# Patient Record
Sex: Male | Born: 1940
Health system: Southern US, Community
[De-identification: ages and names within clinical notes are randomized; demographics above are authoritative.]

## PROBLEM LIST (undated history)

## (undated) DIAGNOSIS — R7303 Prediabetes: Secondary | ICD-10-CM

## (undated) DIAGNOSIS — I2510 Atherosclerotic heart disease of native coronary artery without angina pectoris: Secondary | ICD-10-CM

## (undated) DIAGNOSIS — I4891 Unspecified atrial fibrillation: Secondary | ICD-10-CM

## (undated) DIAGNOSIS — E785 Hyperlipidemia, unspecified: Secondary | ICD-10-CM

## (undated) DIAGNOSIS — Z9889 Other specified postprocedural states: Secondary | ICD-10-CM

## (undated) DIAGNOSIS — N5089 Other specified disorders of the male genital organs: Secondary | ICD-10-CM

## (undated) DIAGNOSIS — Z87442 Personal history of urinary calculi: Secondary | ICD-10-CM

## (undated) DIAGNOSIS — I639 Cerebral infarction, unspecified: Secondary | ICD-10-CM

## (undated) DIAGNOSIS — R112 Nausea with vomiting, unspecified: Secondary | ICD-10-CM

## (undated) HISTORY — DX: Atherosclerotic heart disease of native coronary artery without angina pectoris: I25.10

## (undated) HISTORY — DX: Unspecified atrial fibrillation: I48.91

## (undated) HISTORY — DX: Cerebral infarction, unspecified: I63.9

## (undated) HISTORY — PX: IRRIGATION AND DEBRIDEMENT SEBACEOUS CYST: SHX5255

---

## 1968-07-13 HISTORY — PX: CYSTOSCOPY: SUR368

## 1972-07-13 HISTORY — PX: APPENDECTOMY: SHX54

## 2008-07-13 HISTORY — PX: SKIN CANCER EXCISION: SHX779

## 2010-05-24 ENCOUNTER — Emergency Department (HOSPITAL_COMMUNITY): Admission: EM | Admit: 2010-05-24 | Discharge: 2010-05-24 | Payer: Self-pay | Admitting: Emergency Medicine

## 2010-06-30 HISTORY — PX: EYE SURGERY: SHX253

## 2010-09-19 ENCOUNTER — Ambulatory Visit (HOSPITAL_COMMUNITY)
Admission: RE | Admit: 2010-09-19 | Discharge: 2010-09-19 | Disposition: A | Discharge status: Home / Self Care | Payer: Medicare Other | Source: Ambulatory Visit | Attending: Ophthalmology | Admitting: Ophthalmology

## 2010-09-19 DIAGNOSIS — I6529 Occlusion and stenosis of unspecified carotid artery: Secondary | ICD-10-CM | POA: Insufficient documentation

## 2010-09-19 DIAGNOSIS — H3400 Transient retinal artery occlusion, unspecified eye: Secondary | ICD-10-CM | POA: Insufficient documentation

## 2010-09-30 ENCOUNTER — Encounter (INDEPENDENT_AMBULATORY_CARE_PROVIDER_SITE_OTHER): Payer: Medicare Other | Admitting: Vascular Surgery

## 2010-09-30 ENCOUNTER — Other Ambulatory Visit (INDEPENDENT_AMBULATORY_CARE_PROVIDER_SITE_OTHER): Payer: Medicare Other

## 2010-09-30 DIAGNOSIS — I6529 Occlusion and stenosis of unspecified carotid artery: Secondary | ICD-10-CM

## 2010-10-01 NOTE — Consult Note (Signed)
NEW PATIENT CONSULTATION  [name], [name] DOB:  Sep 01, 1940                                       09/30/2010 [id number]#:[id number]  This patient presents today for evaluation of symptomatic right internal carotid artery stenosis.  He is a very active, pleasant 70 year old gentleman who has had right eye visual changes.  He reports that he had a detached retina which was treated surgically with Dr. Luciana [name] in December 2011.  Subsequently he had several episodes which sounded to be like transit visual ischemia.  He had a temporary loss of vision on several different occasions.  This prompted a carotid duplex showing critical right carotid stenosis and he is seeing [place] for further evaluation.  He specifically denies, before this, of any episodes of amaurosis and prior to that, he denies any history of transient ischemic attack or stroke.  He has no cardiac history.  No history of diabetes, hypertension, or elevated cholesterol.  He is on the daily aspirin therapy and otherwise takes multiple vitamin supplements, but otherwise no prescription medications.  SOCIAL HISTORY:  He is married with two children.  He does not smoke or drink alcohol.  FAMILY HISTORY:  Negative for premature atherosclerotic disease.  REVIEW OF SYSTEMS:  No weight loss or gain.  He weighs 175 pounds.  He is 5 feet 10 inches tall. VASCULAR:  Positive only for his recent visual changes. CARDIAC, GI, NEUROLOGIC:  All negative otherwise. Review of systems is otherwise completely negative.  PHYSICAL EXAMINATION:  This is a well-developed, well-nourished white male appearing stated age in no acute stress.  Blood pressure 126/82 in his right arm, 126/74 in his left arm.  Heart rate 70, respirations 16. He is no acute stress.  HEENT:  Normal.  Lungs:  Clear bilaterally without rales, rhonchi or wheezes.  Heart:  Regular rate and rhythm without murmur.  I do not appreciate carotid bruits bilaterally.   Has 2+ radial and  2+ dorsalis pedis pulses bilaterally.  Abdomen:  Soft, nontender.  No masses noted.  Musculoskeletal:  No major deformities or cyanosis.  Neurologic:  No focal weakness or paresthesias.  Skin: Without ulcers or rashes.  He did undergo repeat duplex in our office to confirm his level of stenosis and ensure that he had adequate visualization of internal carotid artery.  This is does show critical stenosis in his right internal carotid artery with end-diastolic velocity of 242 cm/sec.  I discussed the significance of this with the patient and his wife present.  I have recommended right carotid endarterectomy for reduction of stroke risk due to his symptomatic carotid disease.  I explained the procedure to include 1% to 2% risk of stroke with surgery and risk of cranial nerve injury.  I also explained the very low risk for bleeding or infectious complications.  He understands and wished to proceed with surgery.  We have scheduled this for 10/03/2010 at [facility].    Adam [name], M.D. Electronically Signed  TFE/MEDQ  D:  09/30/2010  T:  10/01/2010  Job:  [id number]  cc:   Adam [name], M.D. Adam [name], M.D.

## 2010-10-02 ENCOUNTER — Other Ambulatory Visit: Payer: Self-pay | Admitting: Vascular Surgery

## 2010-10-02 ENCOUNTER — Encounter (HOSPITAL_COMMUNITY)
Admission: RE | Admit: 2010-10-02 | Discharge: 2010-10-02 | Disposition: A | Discharge status: Home / Self Care | Payer: Medicare Other | Source: Ambulatory Visit | Attending: Vascular Surgery | Admitting: Vascular Surgery

## 2010-10-02 DIAGNOSIS — I6529 Occlusion and stenosis of unspecified carotid artery: Secondary | ICD-10-CM

## 2010-10-02 LAB — URINALYSIS, ROUTINE W REFLEX MICROSCOPIC
Glucose, UA: NEGATIVE mg/dL
Hgb urine dipstick: NEGATIVE
pH: 6.5 (ref 5.0–8.0)

## 2010-10-02 LAB — CBC
HCT: 40.7 % (ref 39.0–52.0)
Hemoglobin: 13.6 g/dL (ref 13.0–17.0)
MCH: 30.5 pg (ref 26.0–34.0)
RBC: 4.46 MIL/uL (ref 4.22–5.81)

## 2010-10-02 LAB — COMPREHENSIVE METABOLIC PANEL
AST: 23 U/L (ref 0–37)
Albumin: 4.3 g/dL (ref 3.5–5.2)
Calcium: 9.4 mg/dL (ref 8.4–10.5)
Creatinine, Ser: 0.93 mg/dL (ref 0.4–1.5)
GFR calc Af Amer: >60 mL/min (ref >60)
GFR calc non Af Amer: >60 mL/min (ref >60)
Total Protein: 6.3 g/dL (ref 6.0–8.3)

## 2010-10-02 LAB — TYPE AND SCREEN

## 2010-10-02 LAB — APTT: aPTT: 30 seconds (ref 24–37)

## 2010-10-02 LAB — ABO/RH: ABO/RH(D): O NEG

## 2010-10-02 LAB — SURGICAL PCR SCREEN
MRSA, PCR: NEGATIVE
Staphylococcus aureus: POSITIVE — AB

## 2010-10-02 LAB — PROTIME-INR: Prothrombin Time: 13.6 seconds (ref 11.6–15.2)

## 2010-10-03 ENCOUNTER — Other Ambulatory Visit: Payer: Self-pay | Admitting: Vascular Surgery

## 2010-10-03 ENCOUNTER — Inpatient Hospital Stay (HOSPITAL_COMMUNITY)
Admission: RE | Admit: 2010-10-03 | Discharge: 2010-10-04 | DRG: 039 | Disposition: A | Discharge status: Home / Self Care | Payer: Medicare Other | Source: Ambulatory Visit | Attending: Vascular Surgery | Admitting: Vascular Surgery

## 2010-10-03 DIAGNOSIS — I6529 Occlusion and stenosis of unspecified carotid artery: Principal | ICD-10-CM | POA: Diagnosis present

## 2010-10-03 DIAGNOSIS — Z01812 Encounter for preprocedural laboratory examination: Secondary | ICD-10-CM

## 2010-10-03 DIAGNOSIS — Z7982 Long term (current) use of aspirin: Secondary | ICD-10-CM

## 2010-10-03 DIAGNOSIS — Z01818 Encounter for other preprocedural examination: Secondary | ICD-10-CM

## 2010-10-03 DIAGNOSIS — Z01810 Encounter for preprocedural cardiovascular examination: Secondary | ICD-10-CM

## 2010-10-03 HISTORY — PX: CAROTID ENDARTERECTOMY: SUR193

## 2010-10-04 LAB — CBC
MCV: 91.4 fL (ref 78.0–100.0)
Platelets: 146 10*3/uL — ABNORMAL LOW (ref 150–400)
RDW: 12.5 % (ref 11.5–15.5)
WBC: 8.7 10*3/uL (ref 4.0–10.5)

## 2010-10-04 LAB — BASIC METABOLIC PANEL
BUN: 9 mg/dL (ref 6–23)
GFR calc non Af Amer: >60 mL/min (ref >60)
Potassium: 3.8 mEq/L (ref 3.5–5.1)

## 2010-10-06 NOTE — Op Note (Signed)
Adam [name], Adam [name]                ACCOUNT NO.:  [id number]  MEDICAL RECORD NO.:  [id number]           PATIENT TYPE:  I  LOCATION:  3303                         FACILITY:  [facility]  PHYSICIAN:  Larina Earthly, M.D.    DATE OF BIRTH:  11/09/1940  DATE OF PROCEDURE:  10/03/2010 DATE OF DISCHARGE:  10/02/2010                              OPERATIVE REPORT   PREOPERATIVE DIAGNOSIS:  Symptomatic right internal carotid artery stenosis.  POSTOPERATIVE DIAGNOSIS:  Symptomatic right internal carotid artery stenosis.  PROCEDURES:  Right carotid endarterectomy and Dacron patch angioplasty.  SURGEON:  Larina Earthly, MD  ASSISTANTS:  Dr. Bennie Hind and Della Goo, PA-C  ANESTHESIA:  General endotracheal.  COMPLICATIONS:  None.  DISPOSITION:  To recovery room neurologically intact.  PROCEDURE IN DETAIL:  The patient was taken to the operating room, placed in supine position where the right neck was prepped and in usual sterile fashion.  An incision was made in the anterior sternocleidomastoid carried down through the platysma with electrocautery.  The sternocleidomastoid reflected posteriorly and the carotid sheath was opened.  Facial vein was ligated with 2-0 silk ties and divided.  The vagus and hypoglossal nerves identified and preserved. The common carotid artery was circled with umbilical tape and Rumel tourniquet.  The superior thyroid artery was circled with 2-0 silk Potts tie and the external carotid was circled with vessel loop and the internal carotid was circled with umbilical tape and Rumel tourniquet. The patient was given 8000 units of intravenous heparin.  After adequate circulation time, the internal and external common carotid arteries were occluded.  The common carotid artery was opened with #11 blade, sewn with Potts scissors through the plaque onto the internal carotid with Potts scissors.  A #10 shunt was passed up the internal carotid artery, allowed to back  bleed, and down the common carotid, were secured with Rumel tourniquets.  Endarterectomy was begun on the common carotid artery.  The plaque was divided proximally with Potts scissors.  The endarterectomy was carried down to the bifurcation.  The external carotid was endarterectomized with eversion technique and the internal carotid was endarterectomized in open fashion.  The remaining atheromatous debris was removed from endarterectomy plane.  Finesse Hemashield Dacron patch brought on the field and sewn as a patch angioplasty with a running 6-0 Prolene suture.  Prior to completion of the anastomosis, the shunt was removed and the usual flushing maneuvers were undertaken.  The anastomosis was completed and flow restored first to the external, then the internal carotid artery.  The patient had excellent flow characteristics with handheld Doppler in the internal and external carotid arteries.  The patient was given 50 mg of protamine to reverse heparin.  Wounds irrigated with saline.  Hemostasis was obtained with electrocautery.  Wounds were closed with several 3-0 Vicryl sutures to reapproximate sternocleidomastoid over the carotid sheath.  Next, the platysma was closed with 3-0 Vicryl sutures and skin was closed with a 4-0 subcuticular Vicryl stitch.  Benzoin and Steri-Strips were applied.  The patient was taken to the recovery room in stable condition.     Adam [name].  Adam [name], M.D.     TFE/MEDQ  D:  10/03/2010  T:  10/04/2010  Job:  [id number]  cc:   Catalina Pizza, M.D. Alford Highland. Rankin, M.D.  Electronically Signed by Tawanna Cooler Jordann Grime M.D. on 10/06/2010 09:20:22 AM

## 2010-10-07 NOTE — Discharge Summary (Signed)
  NAME:  Adam [name], Adam [name]                ACCOUNT NO.:  [id number]  MEDICAL RECORD NO.:  [id number]           PATIENT TYPE:  I  LOCATION:  3303                         FACILITY:  [facility]  PHYSICIAN:  Della Goo, PA-C  DATE OF BIRTH:  [birth year]  DATE OF ADMISSION:  10/03/2010 DATE OF DISCHARGE:  10/04/2010                              DISCHARGE SUMMARY   CHIEF COMPLAINT:  Symptomatic right carotid stenosis.  HISTORY OF PRESENT ILLNESS:  Adam [name] is a 70 year old gentleman who was sent to [place] after having right eye visual changes.  He had a detached retina which was treated surgically in December 2011.  Subsequently, he had several episodes which were like transient visual ischemia.  A temporary loss of vision on several different occasions.  Duplex scan showed critical right carotid stenosis with an end-diastolic velocity on his duplex scan of August 16, 2000.  It was felt that the patient should undergo a right carotid endarterectomy to reduce stroke risk and he was admitted for that purpose.  PAST MEDICAL HISTORY:  The patient is fairly healthy.  He has no cardiac history.  No history of diabetes, hypertension, or hypercholesterolemia. He is on daily aspirin therapy and multivitamins.  HOSPITAL COURSE:  The patient was taken to the operating room on October 03, 2010, for a right carotid endarterectomy with Dacron patch angioplasty.  The patient did well postoperatively.  He had slight headache immediately postop but at this resolved on the first postoperative day.  He had no difficulty swallowing or speaking.  His right neck was soft.  His wounds were healing well.  He was ambulating, voiding, and taking p.o.  He had good and equal strength in bilateral upper and lower extremities.  He had no tongue deviation or facial droop.  DISPOSITION:  The patient will be discharged home and follow up with Dr. Arbie Cookey in 2 weeks.  All instructions, both written and verbal, were given to the  patient.  FINAL DIAGNOSIS:  Critical right carotid stenosis status post right carotid endarterectomy with no neurological deficits postoperative.  Discharge medications include Percocet 1-2 tablets every 4 hours as needed for pain, aspirin 325 mg daily, fish oil 1000 mg twice daily, garlic 1000 mg daily, glucosamine daily.  He takes several types of vitamins.  He also takes some ibuprofen 1 tablet every 8 hours as needed for arthritis pain.     Della Goo, PA-C     RR/MEDQ  D:  10/04/2010  T:  10/04/2010  Job:  [id number]  Electronically Signed by Della Goo PA on 10/07/2010 11:23:34 AM

## 2010-10-07 NOTE — Procedures (Unsigned)
CAROTID DUPLEX EXAM  INDICATION:  Carotid artery disease.  HISTORY: Diabetes:  No. Cardiac:  No. Hypertension:  No. Smoking:  No. Previous Surgery:  No. CV History:  The patient had left visual eye disturbance. Amaurosis Fugax No, Paresthesias No, Hemiparesis No                                      RIGHT             LEFT Brachial systolic pressure:         128               134 Brachial Doppler waveforms:         WNL               WNL Vertebral direction of flow:        Atypical antegrade                  Antegrade DUPLEX VELOCITIES (cm/sec) CCA peak systolic                   58                74 ECA peak systolic                   75                81 ICA peak systolic                   470               68 ICA end diastolic                   242               33 PLAQUE MORPHOLOGY:                  Heterogeneous     Heterogeneous PLAQUE AMOUNT:                      Severe            Minimal PLAQUE LOCATION:                    ICA               ICA and ECA  IMPRESSION:  80% to 99% stenosis within the right internal carotid artery.  Left side no hemodynamically significant stenosis.  The left vertebral is prominent.  A little bit of intimal thickening within the common carotid arteries.  Discussed findings with Dr. Arbie Cookey.     ___________________________________________ Larina Earthly, M.D.  OD/MEDQ  D:  09/30/2010  T:  09/30/2010  Job:  [id number]

## 2010-10-21 ENCOUNTER — Ambulatory Visit (INDEPENDENT_AMBULATORY_CARE_PROVIDER_SITE_OTHER): Payer: Medicare Other | Admitting: Vascular Surgery

## 2010-10-21 DIAGNOSIS — I6529 Occlusion and stenosis of unspecified carotid artery: Secondary | ICD-10-CM

## 2010-10-21 NOTE — Assessment & Plan Note (Signed)
OFFICE VISIT  KEMPER, HEUPEL DOB:  [birth year]                                       10/21/2010 [id number]#:[id number]  Patient presents today for follow-up of his right carotid endarterectomy for symptomatic carotid disease on 10/02/10.  He did well with surgery and was discharged to home on postoperative day #1.  He has continued to do well since discharge from surgery.  He did have a significant amount of nausea and vomiting immediately after his awakening from anesthesia, which was uncomfortable to him but otherwise no difficulties.  His right neck incision is well-healed.  He has no bruit.  He is grossly intact neurologically.  He does have the usual amount of peri-incisional numbness.  He is to return to full activity without limitation.  I will plan to see him again in 6 months with repeat carotid duplex at that time.  He will notify us should he develop any difficulty in the interim.    Larina Earthly, M.D. Electronically Signed  TFE/MEDQ  D:  10/21/2010  T:  10/21/2010  Job:  [id number]  cc:   Catalina Pizza, M.D. Alford [place]. Rankin, M.D.

## 2010-12-02 ENCOUNTER — Other Ambulatory Visit: Payer: Self-pay | Admitting: Family Medicine

## 2011-01-20 ENCOUNTER — Encounter (HOSPITAL_COMMUNITY): Payer: Self-pay

## 2011-01-20 ENCOUNTER — Encounter (HOSPITAL_COMMUNITY)
Admission: RE | Admit: 2011-01-20 | Discharge: 2011-01-20 | Disposition: A | Discharge status: Home / Self Care | Payer: Medicare Other | Source: Ambulatory Visit | Attending: Ophthalmology | Admitting: Ophthalmology

## 2011-01-20 HISTORY — DX: Nausea with vomiting, unspecified: R11.2

## 2011-01-20 HISTORY — DX: Hyperlipidemia, unspecified: E78.5

## 2011-01-20 HISTORY — DX: Other specified postprocedural states: Z98.890

## 2011-01-20 LAB — CBC
HCT: 41.1 % (ref 39.0–52.0)
MCH: 30.5 pg (ref 26.0–34.0)
MCHC: 33.1 g/dL (ref 30.0–36.0)
MCV: 92.2 fL (ref 78.0–100.0)
Platelets: 188 10*3/uL (ref 150–400)
RDW: 12 % (ref 11.5–15.5)
WBC: 6 10*3/uL (ref 4.0–10.5)

## 2011-01-20 LAB — BASIC METABOLIC PANEL
BUN: 15 mg/dL (ref 6–23)
CO2: 33 mEq/L — ABNORMAL HIGH (ref 19–32)
Calcium: 9.8 mg/dL (ref 8.4–10.5)
Chloride: 104 mEq/L (ref 96–112)
Creatinine, Ser: 0.89 mg/dL (ref 0.50–1.35)
Glucose, Bld: 97 mg/dL (ref 70–99)

## 2011-01-20 NOTE — Patient Instructions (Signed)
20 OMEGA SLAGER  01/20/2011   Your procedure is scheduled on: Monday, 01/26/11   Report to [facility] at 06:15 AM.  Call this number if you have problems the morning of surgery: [phone]   Remember:   Do not eat food:After Midnight.  Do not drink clear liquids: After Midnight.  Take these medicines the morning of surgery with A SIP OF WATER: none   Do not bring valuables to the hospital.  Contacts, dentures or bridgework may not be worn into surgery.  For patients admitted to the hospital, checkout time is 11:00 AM the day of discharge.   Patients discharged the day of surgery will not be allowed to drive home.  Name and phone number of your driver:MS. Lucy  Special Instructions: N/A   Please read over the following fact sheets that you were given: Pain Booklet and Anesthesia Post-op Instructions   PATIENT INSTRUCTIONS POST-ANESTHESIA  IMMEDIATELY FOLLOWING SURGERY:  Do not drive or operate machinery for the first twenty four hours after surgery.  Do not make any important decisions for twenty four hours after surgery or while taking narcotic pain medications or sedatives.  If you develop intractable nausea and vomiting or a severe headache please notify your doctor immediately.  FOLLOW-UP:  Please make an appointment with your surgeon as instructed. You do not need to follow up with anesthesia unless specifically instructed to do so.  WOUND CARE INSTRUCTIONS (if applicable):  Keep a dry clean dressing on the anesthesia/puncture wound site if there is drainage.  Once the wound has quit draining you may leave it open to air.  Generally you should leave the bandage intact for twenty four hours unless there is drainage.  If the epidural site drains for more than 36-48 hours please call the anesthesia department.  QUESTIONS?:  Please feel free to call your physician or the hospital operator if you have any questions, and they will be happy to assist you.     [facility]  Anesthesia Department [address] [place] [phone]

## 2011-01-26 ENCOUNTER — Encounter (HOSPITAL_COMMUNITY)
Admission: RE | Disposition: A | Discharge status: Home / Self Care | Payer: Self-pay | Source: Ambulatory Visit | Attending: Ophthalmology

## 2011-01-26 ENCOUNTER — Ambulatory Visit (HOSPITAL_COMMUNITY)
Admission: RE | Admit: 2011-01-26 | Discharge: 2011-01-26 | Disposition: A | Discharge status: Home / Self Care | Payer: Medicare Other | Source: Ambulatory Visit | Attending: Ophthalmology | Admitting: Ophthalmology

## 2011-01-26 ENCOUNTER — Encounter (HOSPITAL_COMMUNITY): Payer: Self-pay | Admitting: *Deleted

## 2011-01-26 ENCOUNTER — Encounter (HOSPITAL_COMMUNITY): Payer: Self-pay | Admitting: Anesthesiology

## 2011-01-26 ENCOUNTER — Ambulatory Visit (HOSPITAL_COMMUNITY): Payer: Medicare Other | Admitting: Anesthesiology

## 2011-01-26 DIAGNOSIS — Z7982 Long term (current) use of aspirin: Secondary | ICD-10-CM | POA: Insufficient documentation

## 2011-01-26 DIAGNOSIS — Z01812 Encounter for preprocedural laboratory examination: Secondary | ICD-10-CM | POA: Insufficient documentation

## 2011-01-26 DIAGNOSIS — H2510 Age-related nuclear cataract, unspecified eye: Secondary | ICD-10-CM | POA: Insufficient documentation

## 2011-01-26 DIAGNOSIS — Z01810 Encounter for preprocedural cardiovascular examination: Secondary | ICD-10-CM | POA: Insufficient documentation

## 2011-01-26 HISTORY — PX: CATARACT EXTRACTION W/PHACO: SHX586

## 2011-01-26 SURGERY — PHACOEMULSIFICATION, CATARACT, WITH IOL INSERTION
Anesthesia: Monitor Anesthesia Care | Site: Eye | Laterality: Right | Wound class: Clean

## 2011-01-26 MED ORDER — BALANCED SALT IO SOLN
INTRAOCULAR | Status: DC | PRN
  Administered 2011-01-26: 30 mL via OPHTHALMIC

## 2011-01-26 MED ORDER — GATIFLOXACIN 0.5 % OP SOLN
1.0000 [drp] | OPHTHALMIC | Status: DC
  Administered 2011-01-26: 1 [drp] via OPHTHALMIC

## 2011-01-26 MED ORDER — LACTATED RINGERS IV SOLN
INTRAVENOUS | Status: DC
  Administered 2011-01-26: 07:00:00 via INTRAVENOUS

## 2011-01-26 MED ORDER — TETRACAINE HCL 0.5 % OP SOLN
1.0000 [drp] | OPHTHALMIC | Status: AC
  Administered 2011-01-26: 1 [drp] via OPHTHALMIC

## 2011-01-26 MED ORDER — ONDANSETRON HCL 4 MG/2ML IJ SOLN
4.0000 mg | Freq: Once | INTRAMUSCULAR | Status: AC
  Administered 2011-01-26: 4 mg via INTRAVENOUS

## 2011-01-26 MED ORDER — LIDOCAINE HCL 3.5 % OP GEL
OPHTHALMIC | Status: AC
  Administered 2011-01-26: 1 via OPHTHALMIC
  Filled 2011-01-26: qty 5

## 2011-01-26 MED ORDER — NA HYALUR & NA CHOND-NA HYALUR 0.55-0.5 ML IO KIT
PACK | INTRAOCULAR | Status: DC | PRN
  Administered 2011-01-26: 1 via OPHTHALMIC

## 2011-01-26 MED ORDER — LIDOCAINE HCL 3.5 % OP GEL
OPHTHALMIC | Status: DC | PRN
  Administered 2011-01-26: 1 via OPHTHALMIC

## 2011-01-26 MED ORDER — LACTATED RINGERS IV SOLN
INTRAVENOUS | Status: DC | PRN
  Administered 2011-01-26: 07:00:00 via INTRAVENOUS

## 2011-01-26 MED ORDER — ONDANSETRON HCL 4 MG/2ML IJ SOLN
INTRAMUSCULAR | Status: AC
  Administered 2011-01-26: 4 mg via INTRAVENOUS
  Filled 2011-01-26: qty 2

## 2011-01-26 MED ORDER — EPINEPHRINE HCL 1 MG/ML IJ SOLN
INTRAMUSCULAR | Status: AC
  Filled 2011-01-26: qty 1

## 2011-01-26 MED ORDER — CYCLOPENTOLATE-PHENYLEPHRINE 0.2-1 % OP SOLN
1.0000 [drp] | OPHTHALMIC | Status: AC
  Administered 2011-01-26: 1 [drp] via OPHTHALMIC

## 2011-01-26 MED ORDER — MIDAZOLAM HCL 2 MG/2ML IJ SOLN
1.0000 mg | INTRAMUSCULAR | Status: DC | PRN
  Administered 2011-01-26: 2 mg via INTRAVENOUS

## 2011-01-26 MED ORDER — TETRACAINE HCL 0.5 % OP SOLN
OPHTHALMIC | Status: AC
  Administered 2011-01-26: 1 [drp] via OPHTHALMIC
  Filled 2011-01-26: qty 2

## 2011-01-26 MED ORDER — GATIFLOXACIN 0.5 % OP SOLN
OPHTHALMIC | Status: DC | PRN
  Administered 2011-01-26: 2 [drp] via OPHTHALMIC

## 2011-01-26 MED ORDER — EPINEPHRINE HCL 1 MG/ML IJ SOLN
INTRAOCULAR | Status: DC | PRN
  Administered 2011-01-26: 07:00:00

## 2011-01-26 MED ORDER — MIDAZOLAM HCL 2 MG/2ML IJ SOLN
INTRAMUSCULAR | Status: AC
  Administered 2011-01-26: 2 mg via INTRAVENOUS
  Filled 2011-01-26: qty 2

## 2011-01-26 MED ORDER — TETRACAINE HCL 0.5 % OP SOLN
OPHTHALMIC | Status: DC | PRN
  Administered 2011-01-26: 1 [drp] via OPHTHALMIC

## 2011-01-26 MED ORDER — LIDOCAINE HCL 3.5 % OP GEL
1.0000 "application " | Freq: Once | OPHTHALMIC | Status: AC
  Administered 2011-01-26: 1 via OPHTHALMIC

## 2011-01-26 MED ORDER — KETOROLAC TROMETHAMINE 0.5 % OP SOLN
1.0000 [drp] | OPHTHALMIC | Status: AC
  Administered 2011-01-26: 1 [drp] via OPHTHALMIC

## 2011-01-26 SURGICAL SUPPLY — 28 items
CAPSULAR TENSION RING-AMO (OPHTHALMIC RELATED) IMPLANT
CLOTH BEACON ORANGE TIMEOUT ST (SAFETY) ×2 IMPLANT
GLOVE BIO SURGEON STRL SZ7.5 (GLOVE) ×2 IMPLANT
GLOVE BIOGEL M 6.5 STRL (GLOVE) IMPLANT
GLOVE BIOGEL PI IND STRL 6.5 (GLOVE) IMPLANT
GLOVE BIOGEL PI IND STRL 7.0 (GLOVE) IMPLANT
GLOVE BIOGEL PI INDICATOR 6.5 (GLOVE)
GLOVE BIOGEL PI INDICATOR 7.0 (GLOVE)
GLOVE ECLIPSE 6.5 STRL STRAW (GLOVE) IMPLANT
GLOVE ECLIPSE 7.5 STRL STRAW (GLOVE) IMPLANT
GLOVE EXAM NITRILE LRG STRL (GLOVE) IMPLANT
GLOVE EXAM NITRILE MD LF STRL (GLOVE) ×2 IMPLANT
GLOVE SKINSENSE NS SZ6.5 (GLOVE) ×2
GLOVE SKINSENSE NS SZ7.0 (GLOVE)
GLOVE SKINSENSE STRL SZ6.5 (GLOVE) ×2 IMPLANT
GLOVE SKINSENSE STRL SZ7.0 (GLOVE) IMPLANT
GOWN BRE IMP SLV AUR XL STRL (GOWN DISPOSABLE) ×6 IMPLANT
KIT VITRECTOMY (OPHTHALMIC RELATED) IMPLANT
PAD ARMBOARD 7.5X6 YLW CONV (MISCELLANEOUS) ×2 IMPLANT
PROC W NO LENS (INTRAOCULAR LENS)
PROC W SPEC LENS (INTRAOCULAR LENS)
PROCEDURE 11-20 SIGHTPATH (OPHTHALMIC RELATED) IMPLANT
PROCESS W NO LENS (INTRAOCULAR LENS) IMPLANT
PROCESS W SPEC LENS (INTRAOCULAR LENS) IMPLANT
RING MALYGIN (MISCELLANEOUS) IMPLANT
SIGHTPATH CAT PROC W REG LENS (Ophthalmic Related) ×2 IMPLANT
VISCOELASTIC ADDITIONAL (OPHTHALMIC RELATED) IMPLANT
WATER STERILE IRR 250ML POUR (IV SOLUTION) ×2 IMPLANT

## 2011-01-26 NOTE — Anesthesia Preprocedure Evaluation (Addendum)
Anesthesia Evaluation  Name, MR# and DOB Patient awake  General Assessment Comment  Reviewed: Allergy & Precautions, H&P  and Patient's Chart, lab work & pertinent test results  History of Anesthesia Complications (+) PONV  Airway Mallampati: II TM Distance: >3 FB Neck ROM: Full    Dental  (+) Teeth Intact   Pulmonary    pulmonary exam normal   Cardiovascular + Peripheral Vascular Disease (carotid endarerectomy) Regular Normal   Neuro/PsychCVA, No Residual Symptoms  GI/Hepatic/Renal   Endo/Other   Abdominal   Musculoskeletal  Hematology   Peds  Reproductive/Obstetrics   Anesthesia Other Findings             Anesthesia Physical Anesthesia Plan  ASA: III  Anesthesia Plan: MAC   Post-op Pain Management:    Induction:   Airway Management Planned: Nasal Cannula  Additional Equipment:   Intra-op Plan:   Post-operative Plan:   Informed Consent: I have reviewed the patients History and Physical, chart, labs and discussed the procedure including the risks, benefits and alternatives for the proposed anesthesia with the patient or authorized representative who has indicated his/her understanding and acceptance.     Plan Discussed with: CRNA  Anesthesia Plan Comments:        Anesthesia Quick Evaluation

## 2011-01-26 NOTE — Anesthesia Postprocedure Evaluation (Signed)
  Anesthesia Post-op Note  Patient: Adam [name]  Procedure(s) Performed:  CATARACT EXTRACTION PHACO AND INTRAOCULAR LENS PLACEMENT (IOC)  Patient Location: PACU and Short Stay  Anesthesia Type: MAC  Level of Consciousness: awake, alert  and oriented  Airway and Oxygen Therapy: Patient Spontanous Breathing  Post-op Pain: none  Post-op Assessment: Post-op Vital signs reviewed, Patient's Cardiovascular Status Stable, Respiratory Function Stable, Patent Airway and Adequate PO intake  Post-op Vital Signs: stable  Complications: No apparent anesthesia complications

## 2011-01-26 NOTE — H&P (Signed)
  H&P updated, status unchanged

## 2011-01-26 NOTE — Brief Op Note (Signed)
01/26/2011  8:51 AM  PATIENT:  Adam [name]  70 y.o. male  PRE-OPERATIVE DIAGNOSIS:  nuclear cataract right eye  POST-OPERATIVE DIAGNOSIS:  nuclear cataract right eye  PROCEDURE:  Procedure(s): CATARACT EXTRACTION PHACO AND INTRAOCULAR LENS PLACEMENT (IOC)  SURGEON:  Surgeon(s): Susa Simmonds  PHYSICIAN ASSISTANT:   ASSISTANTS: none   ANESTHESIA:   topical  ESTIMATED BLOOD LOSS: * No blood loss amount entered *   BLOOD ADMINISTERED:none  DRAINS: none   LOCAL MEDICATIONS USED:  NONE  SPECIMEN:  No Specimen  DISPOSITION OF SPECIMEN:  N/A  COUNTS:  NO N/A  TOURNIQUET:  * No tourniquets in log *  DICTATION #:   PLAN OF CARE: follow up in office   PATIENT DISPOSITION:  Short Stay   Delay start of Pharmacological VTE agent (>24hrs) due to surgical blood loss or risk of bleeding:  no

## 2011-01-26 NOTE — Transfer of Care (Signed)
Immediate Anesthesia Transfer of Care Note  Patient: Adam [name]  Procedure(s) Performed:  CATARACT EXTRACTION PHACO AND INTRAOCULAR LENS PLACEMENT (IOC)  Patient Location: PACU and Short Stay  Anesthesia Type: MAC  Level of Consciousness: awake, alert  and oriented  Airway & Oxygen Therapy: Patient Spontanous Breathing  Post-op Assessment: Report given to PACU RN  Post vital signs: stable  Complications: No apparent anesthesia complications

## 2011-01-26 NOTE — OR Nursing (Signed)
zymar and ketorolac given right eye at 0643 this am per j Cincere Zorn rn

## 2011-02-16 NOTE — Op Note (Signed)
See scanned note.

## 2011-02-23 ENCOUNTER — Encounter (HOSPITAL_COMMUNITY): Payer: Self-pay | Admitting: Ophthalmology

## 2011-03-18 ENCOUNTER — Encounter: Payer: Self-pay | Admitting: Vascular Surgery

## 2011-04-21 ENCOUNTER — Other Ambulatory Visit: Payer: Medicare Other

## 2011-04-21 ENCOUNTER — Ambulatory Visit: Payer: Medicare Other | Admitting: Vascular Surgery

## 2011-04-28 ENCOUNTER — Ambulatory Visit: Payer: Medicare Other | Admitting: Vascular Surgery

## 2011-04-28 ENCOUNTER — Other Ambulatory Visit: Payer: Medicare Other

## 2011-05-26 ENCOUNTER — Ambulatory Visit: Payer: Medicare Other | Admitting: Vascular Surgery

## 2011-05-26 ENCOUNTER — Other Ambulatory Visit: Payer: Medicare Other

## 2011-06-15 ENCOUNTER — Encounter: Payer: Self-pay | Admitting: Vascular Surgery

## 2011-06-16 ENCOUNTER — Ambulatory Visit (INDEPENDENT_AMBULATORY_CARE_PROVIDER_SITE_OTHER): Payer: Medicare Other | Admitting: *Deleted

## 2011-06-16 ENCOUNTER — Encounter: Payer: Self-pay | Admitting: Vascular Surgery

## 2011-06-16 ENCOUNTER — Ambulatory Visit (INDEPENDENT_AMBULATORY_CARE_PROVIDER_SITE_OTHER): Payer: Medicare Other | Admitting: Vascular Surgery

## 2011-06-16 VITALS — BP 122/73 | HR 16 | Resp 16 | Ht 71.0 in | Wt 171.0 lb

## 2011-06-16 DIAGNOSIS — Z48812 Encounter for surgical aftercare following surgery on the circulatory system: Secondary | ICD-10-CM

## 2011-06-16 DIAGNOSIS — I6529 Occlusion and stenosis of unspecified carotid artery: Secondary | ICD-10-CM

## 2011-06-16 NOTE — Progress Notes (Signed)
The patient presents today for followup of his right carotid endarterectomy in March of 2012. He has done well since his surgery he is. He has had no neurologic deficits.he has some mild numbness in the peri-incisional area otherwise is returned to his baseline. Thereafter no new major medical difficulties since his last visit with me. He has had some eye surgery.  Past Medical History  Diagnosis Date  . Hyperlipemia   . PONV (postoperative nausea and vomiting)   . Stroke     History  Substance Use Topics  . Smoking status: Never Smoker   . Smokeless tobacco: Never Used  . Alcohol Use: No    Family History  Problem Relation Age of Onset  . Anesthesia problems Neg Hx   . Hypotension Neg Hx   . Malignant hyperthermia Neg Hx   . Pseudochol deficiency Neg Hx     No Known Allergies  Current outpatient prescriptions:Ascorbic Acid (VITAMIN C PO), Take by mouth daily.  , Disp: , Rfl: ;  aspirin 325 MG EC tablet, Take 162.5 mg by mouth daily.  , Disp: , Rfl: ;  b complex vitamins tablet, Take 1 tablet by mouth daily.  , Disp: , Rfl: ;  Calcium Carbonate-Vitamin D (CALCIUM + D PO), Take by mouth daily.  , Disp: , Rfl: ;  fish oil-omega-3 fatty acids 1000 MG capsule, Take 1 g by mouth daily.  , Disp: , Rfl:  Garlic 1000 MG CAPS, Take 1 capsule by mouth daily.  , Disp: , Rfl: ;  gatifloxacin (ZYMAXID) 0.5 % SOLN, Place 1 drop into the right eye 4 (four) times daily.  , Disp: , Rfl: ;  ibuprofen (ADVIL,MOTRIN) 200 MG tablet, Take 200 mg by mouth every 8 (eight) hours as needed. For pain , Disp: , Rfl: ;  ketorolac (ACULAR) 0.5 % ophthalmic solution, Place 1 drop into the right eye 4 (four) times daily.  , Disp: , Rfl:  MAGNESIUM PO, Take by mouth daily.  , Disp: , Rfl: ;  Multiple Vitamins-Minerals (MULTIVITAMIN WITH MINERALS) tablet, Take 1 tablet by mouth daily.  , Disp: , Rfl: ;  simvastatin (ZOCOR) 20 MG tablet, Take 20 mg by mouth at bedtime.  , Disp: , Rfl: ;  vitamin E 400 UNIT capsule, Take  400 Units by mouth daily.  , Disp: , Rfl:  prednisoLONE acetate (PRED FORTE) 1 % ophthalmic suspension, Place 1 drop into the right eye 4 (four) times daily.  , Disp: , Rfl: ;  Red Yeast Rice Extract (RED YEAST RICE PO), Take by mouth daily.  , Disp: , Rfl:   BP 122/73  Pulse 16  Resp 16  Ht 5\' 11"  (1.803 m)  Wt 171 lb (77.565 kg)  BMI 23.85 kg/m2  SpO2 99%  Body mass index is 23.85 kg/(m^2).       Review of systems no change  Physical exam: Well-developed well-nourished white male in no acute distress. HEENT normal. Neck incision well healed with no bruits bilaterally. Heart regular rate and rhythm without murmur. Chest clear bilaterally. Neurologic grossly intact.  Carotid duplex: Widely patent right endarterectomy with no stenosis. No evidence of left internal carotid artery stenosis.  Impression and plan: Stable status post right carotid endarterectomy in March 2012. He will be seen again in 6 months with repeat carotid duplex. He was notified should he develop any neurologic deficits.

## 2011-06-18 NOTE — Procedures (Unsigned)
CAROTID DUPLEX EXAM  INDICATION:  Carotid endarterectomy.  HISTORY: Diabetes:  No. Cardiac:  No. Hypertension:  No. Smoking:  No. Previous Surgery:  Right carotid endarterectomy on 10/03/2010. CV History:  Currently asymptomatic. Amaurosis Fugax No, Paresthesias No, Hemiparesis No.                                      RIGHT             LEFT Brachial systolic pressure:         104               102 Brachial Doppler waveforms:         Normal            Normal Vertebral direction of flow:        Antegrade         Antegrade DUPLEX VELOCITIES (cm/sec) CCA peak systolic                   82                101 ECA peak systolic                   81                90 ICA peak systolic                   57                63 ICA end diastolic                   20                17 PLAQUE MORPHOLOGY: PLAQUE AMOUNT:                      None              None PLAQUE LOCATION:  IMPRESSION: 1. Patent right carotid endarterectomy site with no bilateral internal     carotid artery stenoses noted. 2. Significant improvement of the right internal carotid artery     velocities when compared to the previous examination on 09/30/2010.  ___________________________________________ Larina Earthly, M.D.  CH/MEDQ  D:  06/16/2011  T:  06/16/2011  Job:  [id number]

## 2011-06-22 ENCOUNTER — Encounter (HOSPITAL_COMMUNITY): Payer: Self-pay | Admitting: Internal Medicine

## 2011-12-16 ENCOUNTER — Ambulatory Visit (INDEPENDENT_AMBULATORY_CARE_PROVIDER_SITE_OTHER): Payer: Medicare Other | Admitting: *Deleted

## 2011-12-16 DIAGNOSIS — I6529 Occlusion and stenosis of unspecified carotid artery: Secondary | ICD-10-CM

## 2011-12-16 DIAGNOSIS — Z48812 Encounter for surgical aftercare following surgery on the circulatory system: Secondary | ICD-10-CM

## 2011-12-23 NOTE — Procedures (Unsigned)
CAROTID DUPLEX EXAM  INDICATION:  Carotid disease.  HISTORY: Diabetes:  No Cardiac:  No Hypertension:  No Smoking:  No Previous Surgery:  Right CEA 10/02/2010 CV History:  Currently asymptomatic Amaurosis Fugax No, Paresthesias No, Hemiparesis No                                      RIGHT             LEFT Brachial systolic pressure:         127               125 Brachial Doppler waveforms:         Normal.           Normal. Vertebral direction of flow:        Antegrade.        Antegrade. DUPLEX VELOCITIES (cm/sec) CCA peak systolic                   70                85 ECA peak systolic                   67                48 ICA peak systolic                   61                56 ICA end diastolic                   27                20 PLAQUE MORPHOLOGY: PLAQUE AMOUNT:                      None.             None. PLAQUE LOCATION:  IMPRESSION:  Patent right carotid endarterectomy site with no bilateral ICA stenosis noted. Stable in comparison to the previous exam.  Antegrade vertebral arteries bilaterally.  ___________________________________________ Larina Earthly, M.D.  EM/MEDQ  D:  12/16/2011  T:  12/16/2011  Job:  [id number]

## 2012-12-08 ENCOUNTER — Other Ambulatory Visit: Payer: Self-pay | Admitting: *Deleted

## 2012-12-08 DIAGNOSIS — Z48812 Encounter for surgical aftercare following surgery on the circulatory system: Secondary | ICD-10-CM

## 2012-12-14 ENCOUNTER — Ambulatory Visit: Payer: Medicare Other | Admitting: Neurosurgery

## 2012-12-14 ENCOUNTER — Other Ambulatory Visit: Payer: Medicare Other

## 2012-12-19 ENCOUNTER — Other Ambulatory Visit: Payer: Medicare Other

## 2012-12-26 ENCOUNTER — Other Ambulatory Visit (INDEPENDENT_AMBULATORY_CARE_PROVIDER_SITE_OTHER): Payer: Medicare Other | Admitting: Vascular Surgery

## 2012-12-26 DIAGNOSIS — I6529 Occlusion and stenosis of unspecified carotid artery: Secondary | ICD-10-CM

## 2012-12-26 DIAGNOSIS — Z48812 Encounter for surgical aftercare following surgery on the circulatory system: Secondary | ICD-10-CM

## 2012-12-27 ENCOUNTER — Other Ambulatory Visit: Payer: Self-pay | Admitting: *Deleted

## 2012-12-27 DIAGNOSIS — Z48812 Encounter for surgical aftercare following surgery on the circulatory system: Secondary | ICD-10-CM

## 2012-12-28 ENCOUNTER — Encounter: Payer: Self-pay | Admitting: Surgery

## 2013-08-25 ENCOUNTER — Other Ambulatory Visit: Payer: Self-pay | Admitting: Surgery

## 2013-08-25 DIAGNOSIS — I6529 Occlusion and stenosis of unspecified carotid artery: Secondary | ICD-10-CM

## 2013-08-25 DIAGNOSIS — Z48812 Encounter for surgical aftercare following surgery on the circulatory system: Secondary | ICD-10-CM

## 2014-01-01 ENCOUNTER — Encounter: Payer: Self-pay | Admitting: Vascular Surgery

## 2014-01-02 ENCOUNTER — Ambulatory Visit (HOSPITAL_COMMUNITY)
Admission: RE | Admit: 2014-01-02 | Discharge: 2014-01-02 | Disposition: A | Discharge status: Home / Self Care | Payer: Medicare HMO | Source: Ambulatory Visit | Attending: Vascular Surgery | Admitting: Vascular Surgery

## 2014-01-02 ENCOUNTER — Encounter: Payer: Self-pay | Admitting: Vascular Surgery

## 2014-01-02 ENCOUNTER — Ambulatory Visit (INDEPENDENT_AMBULATORY_CARE_PROVIDER_SITE_OTHER): Payer: Medicare HMO | Admitting: Vascular Surgery

## 2014-01-02 VITALS — BP 118/66 | HR 55 | Resp 18 | Ht 70.0 in | Wt 167.4 lb

## 2014-01-02 DIAGNOSIS — Z48812 Encounter for surgical aftercare following surgery on the circulatory system: Secondary | ICD-10-CM

## 2014-01-02 DIAGNOSIS — I6529 Occlusion and stenosis of unspecified carotid artery: Secondary | ICD-10-CM | POA: Insufficient documentation

## 2014-01-02 DIAGNOSIS — I6523 Occlusion and stenosis of bilateral carotid arteries: Secondary | ICD-10-CM

## 2014-01-02 NOTE — Progress Notes (Signed)
Patient has today for followup of his right carotid endarterectomy in April 2012. He had preoperative visual changes. Unfortunately this is completely resolved following surgery. He has had no amaurosis fugax, transient ischemic attack or stroke. He remains quite healthy with no major medical difficulties.  Past Medical History  Diagnosis Date  . Hyperlipemia   . PONV (postoperative nausea and vomiting)   . Stroke     History  Substance Use Topics  . Smoking status: Never Smoker   . Smokeless tobacco: Never Used  . Alcohol Use: No    Family History  Problem Relation Age of Onset  . Anesthesia problems Neg Hx   . Hypotension Neg Hx   . Malignant hyperthermia Neg Hx   . Pseudochol deficiency Neg Hx     No Known Allergies  Current outpatient prescriptions:Ascorbic Acid (VITAMIN C PO), Take by mouth daily.  , Disp: , Rfl: ;  aspirin 325 MG EC tablet, Take 162.5 mg by mouth daily.  , Disp: , Rfl: ;  b complex vitamins tablet, Take 1 tablet by mouth daily.  , Disp: , Rfl: ;  Calcium Carbonate-Vitamin D (CALCIUM + D PO), Take by mouth daily.  , Disp: , Rfl: ;  fish oil-omega-3 fatty acids 1000 MG capsule, Take 1 g by mouth daily.  , Disp: , Rfl:  Garlic 5726 MG CAPS, Take 1 capsule by mouth daily.  , Disp: , Rfl: ;  ibuprofen (ADVIL,MOTRIN) 200 MG tablet, Take 200 mg by mouth every 8 (eight) hours as needed. For pain , Disp: , Rfl: ;  MAGNESIUM PO, Take by mouth daily.  , Disp: , Rfl: ;  Multiple Vitamins-Minerals (MULTIVITAMIN WITH MINERALS) tablet, Take 1 tablet by mouth daily.  , Disp: , Rfl: ;  Red Yeast Rice Extract (RED YEAST RICE PO), Take by mouth daily.  , Disp: , Rfl:  simvastatin (ZOCOR) 20 MG tablet, Take 40 mg by mouth at bedtime. , Disp: , Rfl: ;  vitamin E 400 UNIT capsule, Take 400 Units by mouth daily.  , Disp: , Rfl: ;  gatifloxacin (ZYMAXID) 0.5 % SOLN, Place 1 drop into the right eye 4 (four) times daily.  , Disp: , Rfl: ;  ketorolac (ACULAR) 0.5 % ophthalmic solution, Place  1 drop into the right eye 4 (four) times daily.  , Disp: , Rfl:  prednisoLONE acetate (PRED FORTE) 1 % ophthalmic suspension, Place 1 drop into the right eye 4 (four) times daily.  , Disp: , Rfl:   BP 118/66  Pulse 55  Resp 18  Ht 5\' 10"  (1.778 m)  Wt 167 lb 6.4 oz (75.932 kg)  BMI 24.02 kg/m2  Body mass index is 24.02 kg/(m^2).       Physical exam well-developed well-nourished gentleman in no acute distress Carotid arteries without bruits bilaterally. He has a well-healed right neck incision Neurologically he is grossly intact Heart regular rate and rhythm without murmur Radial pulses 2+ bilaterally  He did undergo repeat carotid duplex today in our office. This reveals widely patent endarterectomy on the right with no evidence of recurrent stenosis and a widely patent left carotid artery.  I reviewed this with the patient and his wife present. We will continue usual activities and will notify should he develop any her logically deficits. Otherwise we will see him yearly for carotid duplex followup

## 2014-01-31 ENCOUNTER — Encounter (HOSPITAL_COMMUNITY): Payer: Self-pay | Admitting: Pharmacy Technician

## 2014-02-06 ENCOUNTER — Encounter (HOSPITAL_COMMUNITY): Payer: Self-pay

## 2014-02-06 ENCOUNTER — Encounter (HOSPITAL_COMMUNITY)
Admission: RE | Admit: 2014-02-06 | Discharge: 2014-02-06 | Disposition: A | Discharge status: Home / Self Care | Payer: Medicare HMO | Source: Ambulatory Visit | Attending: Ophthalmology | Admitting: Ophthalmology

## 2014-02-06 DIAGNOSIS — Z01818 Encounter for other preprocedural examination: Secondary | ICD-10-CM | POA: Insufficient documentation

## 2014-02-06 DIAGNOSIS — Z01810 Encounter for preprocedural cardiovascular examination: Secondary | ICD-10-CM | POA: Diagnosis present

## 2014-02-06 DIAGNOSIS — Z01812 Encounter for preprocedural laboratory examination: Secondary | ICD-10-CM | POA: Insufficient documentation

## 2014-02-06 LAB — BASIC METABOLIC PANEL
ANION GAP: 7 (ref 5–15)
BUN: 14 mg/dL (ref 6–23)
CHLORIDE: 105 meq/L (ref 96–112)
CO2: 32 mEq/L (ref 19–32)
CREATININE: 0.94 mg/dL (ref 0.50–1.35)
Calcium: 9.5 mg/dL (ref 8.4–10.5)
GFR, EST NON AFRICAN AMERICAN: 82 mL/min — AB (ref >90)
Glucose, Bld: 119 mg/dL — ABNORMAL HIGH (ref 70–99)
POTASSIUM: 5.1 meq/L (ref 3.7–5.3)
Sodium: 144 mEq/L (ref 137–147)

## 2014-02-06 LAB — HEMOGLOBIN AND HEMATOCRIT, BLOOD
HEMATOCRIT: 38.3 % — AB (ref 39.0–52.0)
Hemoglobin: 12.6 g/dL — ABNORMAL LOW (ref 13.0–17.0)

## 2014-02-06 NOTE — Patient Instructions (Signed)
Adam [name]  02/06/2014   Your procedure is scheduled on:  02/12/2014  Report to [facility] at 9:00 AM.  Call this number if you have problems the morning of surgery: [phone]   Remember:   Do not eat food or drink liquids after midnight.   Take these medicines the morning of surgery with A SIP OF WATER: *none*   Do not wear jewelry, make-up or nail polish.  Do not wear lotions, powders, or perfumes. You may wear deodorant.  Do not shave 48 hours prior to surgery. Men may shave face and neck.  Do not bring valuables to the hospital.  [facility] is not responsible                  for any belongings or valuables.               Contacts, dentures or bridgework may not be worn into surgery.  Leave suitcase in the car. After surgery it may be brought to your room.  For patients admitted to the hospital, discharge time is determined by your                treatment team.               Patients discharged the day of surgery will not be allowed to drive  home.  Name and phone number of your driver: wife Azim Gillingham  Special Instructions: N/A   Please read over the following fact sheets that you were given: Care and Recovery After Surgery    Cataract Surgery  A cataract is a clouding of the lens of the eye. When a lens becomes cloudy, vision is reduced based on the degree and nature of the clouding. Surgery may be needed to improve vision. Surgery removes the cloudy lens and usually replaces it with a substitute lens (intraocular lens, IOL). LET YOUR EYE DOCTOR KNOW ABOUT:  Allergies to food or medicine.  Medicines taken including herbs, eye drops, over-the-counter medicines, and creams.  Use of steroids (by mouth or creams).  Previous problems with anesthetics or numbing medicine.  History of bleeding problems or blood clots.  Previous surgery.  Other health problems, including diabetes and kidney problems.  Possibility of pregnancy, if this applies. RISKS AND  COMPLICATIONS  Infection.  Inflammation of the eyeball (endophthalmitis) that can spread to both eyes (sympathetic ophthalmia).  Poor wound healing.  If an IOL is inserted, it can later fall out of proper position. This is very uncommon.  Clouding of the part of your eye that holds an IOL in place. This is called an "after-cataract." These are uncommon but easily treated. BEFORE THE PROCEDURE  Do not eat or drink anything except small amounts of water for 8 to 12 before your surgery, or as directed by your caregiver.  Unless you are told otherwise, continue any eye drops you have been prescribed.  Talk to your primary caregiver about all other medicines that you take (both prescription and nonprescription). In some cases, you may need to stop or change medicines near the time of your surgery. This is most important if you are taking blood-thinning medicine.Do not stop medicines unless you are told to do so.  Arrange for someone to drive you to and from the procedure.  Do not put contact lenses in either eye on the day of your surgery. PROCEDURE There is more than one method for safely removing a cataract. Your doctor can explain the differences and help  determine which is best for you. Phacoemulsification surgery is the most common form of cataract surgery.  An injection is given behind the eye or eye drops are given to make this a painless procedure.  A small cut (incision) is made on the edge of the clear, dome-shaped surface that covers the front of the eye (cornea).  A tiny probe is painlessly inserted into the eye. This device gives off ultrasound waves that soften and break up the cloudy center of the lens. This makes it easier for the cloudy lens to be removed by suction.  An IOL may be implanted.  The normal lens of the eye is covered by a clear capsule. Part of that capsule is intentionally left in the eye to support the IOL.  Your surgeon may or may not use stitches to  close the incision. There are other forms of cataract surgery that require a larger incision and stitches to close the eye. This approach is taken in cases where the doctor feels that the cataract cannot be easily removed using phacoemulsification. AFTER THE PROCEDURE  When an IOL is implanted, it does not need care. It becomes a permanent part of your eye and cannot be seen or felt.  Your doctor will schedule follow-up exams to check on your progress.  Review your other medicines with your doctor to see which can be resumed after surgery.  Use eye drops or take medicine as prescribed by your doctor. Document Released: 06/18/2011 Document Revised: 11/13/2013 Document Reviewed: 06/18/2011 [facility] Patient Information 2015 [place], [place]. This information is not intended to replace advice given to you by your health care provider. Make sure you discuss any questions you have with your health care provider.

## 2014-02-12 ENCOUNTER — Encounter (HOSPITAL_COMMUNITY): Payer: Self-pay | Admitting: *Deleted

## 2014-02-12 ENCOUNTER — Ambulatory Visit (HOSPITAL_COMMUNITY): Payer: Medicare HMO | Admitting: Anesthesiology

## 2014-02-12 ENCOUNTER — Ambulatory Visit (HOSPITAL_COMMUNITY)
Admission: RE | Admit: 2014-02-12 | Discharge: 2014-02-12 | Disposition: A | Discharge status: Home / Self Care | Payer: Medicare HMO | Source: Ambulatory Visit | Attending: Ophthalmology | Admitting: Ophthalmology

## 2014-02-12 ENCOUNTER — Encounter (HOSPITAL_COMMUNITY)
Admission: RE | Disposition: A | Discharge status: Home / Self Care | Payer: Self-pay | Source: Ambulatory Visit | Attending: Ophthalmology

## 2014-02-12 ENCOUNTER — Encounter (HOSPITAL_COMMUNITY): Payer: Medicare HMO | Admitting: Anesthesiology

## 2014-02-12 DIAGNOSIS — Z7982 Long term (current) use of aspirin: Secondary | ICD-10-CM | POA: Insufficient documentation

## 2014-02-12 DIAGNOSIS — H2510 Age-related nuclear cataract, unspecified eye: Secondary | ICD-10-CM | POA: Diagnosis present

## 2014-02-12 HISTORY — PX: CATARACT EXTRACTION W/PHACO: SHX586

## 2014-02-12 SURGERY — PHACOEMULSIFICATION, CATARACT, WITH IOL INSERTION
Anesthesia: Monitor Anesthesia Care | Site: Eye | Laterality: Left

## 2014-02-12 MED ORDER — CYCLOPENTOLATE-PHENYLEPHRINE 0.2-1 % OP SOLN
1.0000 [drp] | OPHTHALMIC | Status: AC
  Administered 2014-02-12 (×3): 1 [drp] via OPHTHALMIC

## 2014-02-12 MED ORDER — LACTATED RINGERS IV SOLN
INTRAVENOUS | Status: DC
  Administered 2014-02-12: 09:00:00 via INTRAVENOUS

## 2014-02-12 MED ORDER — LACTATED RINGERS IV SOLN
INTRAVENOUS | Status: DC | PRN
  Administered 2014-02-12: 09:00:00 via INTRAVENOUS

## 2014-02-12 MED ORDER — FENTANYL CITRATE 0.05 MG/ML IJ SOLN
INTRAMUSCULAR | Status: AC
  Filled 2014-02-12: qty 2

## 2014-02-12 MED ORDER — MIDAZOLAM HCL 2 MG/2ML IJ SOLN
INTRAMUSCULAR | Status: AC
  Filled 2014-02-12: qty 2

## 2014-02-12 MED ORDER — POVIDONE-IODINE 5 % OP SOLN
OPHTHALMIC | Status: DC | PRN
  Administered 2014-02-12: 1 via OPHTHALMIC

## 2014-02-12 MED ORDER — MIDAZOLAM HCL 5 MG/5ML IJ SOLN
INTRAMUSCULAR | Status: DC | PRN
  Administered 2014-02-12: 1 mg via INTRAVENOUS

## 2014-02-12 MED ORDER — NA HYALUR & NA CHOND-NA HYALUR 0.55-0.5 ML IO KIT
PACK | INTRAOCULAR | Status: DC | PRN
  Administered 2014-02-12: 1 via OPHTHALMIC

## 2014-02-12 MED ORDER — LIDOCAINE HCL 3.5 % OP GEL
OPHTHALMIC | Status: DC | PRN
  Administered 2014-02-12: 1 via OPHTHALMIC

## 2014-02-12 MED ORDER — BSS IO SOLN
INTRAOCULAR | Status: DC | PRN
  Administered 2014-02-12: 15 mL via INTRAOCULAR

## 2014-02-12 MED ORDER — EPINEPHRINE HCL 1 MG/ML IJ SOLN
INTRAMUSCULAR | Status: DC | PRN
  Administered 2014-02-12: 10:00:00

## 2014-02-12 MED ORDER — FENTANYL CITRATE 0.05 MG/ML IJ SOLN
25.0000 ug | INTRAMUSCULAR | Status: AC
  Administered 2014-02-12 (×2): 25 ug via INTRAVENOUS

## 2014-02-12 MED ORDER — LIDOCAINE HCL 3.5 % OP GEL: 1.0000 "application " | Freq: Once | OPHTHALMIC | Status: DC

## 2014-02-12 MED ORDER — MIDAZOLAM HCL 2 MG/2ML IJ SOLN
1.0000 mg | INTRAMUSCULAR | Status: DC | PRN
  Administered 2014-02-12: 2 mg via INTRAVENOUS

## 2014-02-12 MED ORDER — TETRACAINE HCL 0.5 % OP SOLN
OPHTHALMIC | Status: AC
  Filled 2014-02-12: qty 2

## 2014-02-12 MED ORDER — EPINEPHRINE HCL 1 MG/ML IJ SOLN
INTRAMUSCULAR | Status: AC
Start: 2014-02-12 — End: 2014-02-12
  Filled 2014-02-12: qty 1

## 2014-02-12 MED ORDER — LIDOCAINE HCL 3.5 % OP GEL
OPHTHALMIC | Status: AC
  Filled 2014-02-12: qty 1

## 2014-02-12 MED ORDER — CYCLOPENTOLATE-PHENYLEPHRINE OP SOLN OPTIME - NO CHARGE
OPHTHALMIC | Status: AC
  Filled 2014-02-12: qty 2

## 2014-02-12 MED ORDER — TETRACAINE HCL 0.5 % OP SOLN
1.0000 [drp] | OPHTHALMIC | Status: AC
  Administered 2014-02-12 (×3): 1 [drp] via OPHTHALMIC

## 2014-02-12 SURGICAL SUPPLY — 31 items
CAPSULAR TENSION RING-AMO (OPHTHALMIC RELATED) IMPLANT
CLOTH BEACON ORANGE TIMEOUT ST (SAFETY) ×2 IMPLANT
EYE SHIELD UNIVERSAL CLEAR (GAUZE/BANDAGES/DRESSINGS) ×2 IMPLANT
GLOVE BIO SURGEON STRL SZ7.5 (GLOVE) IMPLANT
GLOVE BIOGEL M 6.5 STRL (GLOVE) IMPLANT
GLOVE BIOGEL PI IND STRL 6.5 (GLOVE) ×1 IMPLANT
GLOVE BIOGEL PI IND STRL 7.0 (GLOVE) ×1 IMPLANT
GLOVE BIOGEL PI INDICATOR 6.5 (GLOVE) ×1
GLOVE BIOGEL PI INDICATOR 7.0 (GLOVE) ×1
GLOVE ECLIPSE 6.5 STRL STRAW (GLOVE) IMPLANT
GLOVE ECLIPSE 7.5 STRL STRAW (GLOVE) IMPLANT
GLOVE EXAM NITRILE LRG STRL (GLOVE) IMPLANT
GLOVE EXAM NITRILE MD LF STRL (GLOVE) IMPLANT
GLOVE SKINSENSE NS SZ6.5 (GLOVE)
GLOVE SKINSENSE NS SZ7.0 (GLOVE)
GLOVE SKINSENSE STRL SZ6.5 (GLOVE) IMPLANT
GLOVE SKINSENSE STRL SZ7.0 (GLOVE) IMPLANT
INST SET CATARACT ~~LOC~~ (KITS) ×2 IMPLANT
KIT VITRECTOMY (OPHTHALMIC RELATED) IMPLANT
PAD ARMBOARD 7.5X6 YLW CONV (MISCELLANEOUS) ×2 IMPLANT
PROC W NO LENS (INTRAOCULAR LENS)
PROC W SPEC LENS (INTRAOCULAR LENS)
PROCESS W NO LENS (INTRAOCULAR LENS) IMPLANT
PROCESS W SPEC LENS (INTRAOCULAR LENS) IMPLANT
RETRACTOR IRIS SIGHTPATH (OPHTHALMIC RELATED) IMPLANT
RING MALYGIN (MISCELLANEOUS) IMPLANT
SIGHTPATH CAT PROC W REG LENS (Ophthalmic Related) ×2 IMPLANT
TAPE SURG TRANSPORE 1 IN (GAUZE/BANDAGES/DRESSINGS) ×1 IMPLANT
TAPE SURGICAL TRANSPORE 1 IN (GAUZE/BANDAGES/DRESSINGS) ×1
VISCOELASTIC ADDITIONAL (OPHTHALMIC RELATED) IMPLANT
WATER STERILE IRR 250ML POUR (IV SOLUTION) ×2 IMPLANT

## 2014-02-12 NOTE — H&P (Signed)
I have reviewed the pre printed H&P, the patient was re-examined, and I have identified no significant interval changes in the patient's medical condition.  There is no change in the plan of care since the history and physical of record. 

## 2014-02-12 NOTE — Brief Op Note (Signed)
02/12/2014  10:56 AM  PATIENT:  Adam [name]  73 y.o. male  PRE-OPERATIVE DIAGNOSIS:  nuclear cataract left eye  POST-OPERATIVE DIAGNOSIS:  nuclear cataract left eye  PROCEDURE:  Procedure(s): CATARACT EXTRACTION PHACO AND INTRAOCULAR LENS PLACEMENT (IOC)  SURGEON:  Surgeon(s): Williams Che, MD  ASSISTANTS: Cleda Clarks, CST   ANESTHESIA STAFF: Anesthesiologist: Lerry Liner, MD CRNA: Mickel Baas, CRNA  ANESTHESIA:   topical and MAC  REQUESTED LENS POWER: 12.0  LENS IMPLANT INFORMATION:  Alcon [id number]  S/n [id number]  Exp 06/2018  CUMULATIVE DISSIPATED ENERGY:6.25  INDICATIONS:see office H&P  OP FINDINGS:moderately dense NS  COMPLICATIONS:None  DICTATION #: see scanned op note  PLAN OF CARE: as above  PATIENT DISPOSITION:  Short Stay

## 2014-02-12 NOTE — Anesthesia Procedure Notes (Signed)
Procedure Name: MAC Date/Time: 02/12/2014 9:59 AM Performed by: Andree Elk, Waleska Buttery A Pre-anesthesia Checklist: Patient identified, Timeout performed, Emergency Drugs available, Suction available and Patient being monitored Oxygen Delivery Method: Nasal cannula

## 2014-02-12 NOTE — Anesthesia Preprocedure Evaluation (Signed)
Anesthesia Evaluation  Patient identified by MRN, date of birth, ID band Patient awake    Reviewed: Allergy & Precautions, H&P , Patient's Chart, lab work & pertinent test results  History of Anesthesia Complications (+) PONV and history of anesthetic complications  Airway Mallampati: II TM Distance: >3 FB Neck ROM: Full    Dental  (+) Teeth Intact   Pulmonary    Pulmonary exam normal       Cardiovascular + Peripheral Vascular Disease (carotid endarerectomy) Rhythm:Regular Rate:Normal     Neuro/Psych CVA (prior to carotid endarterectomy), No Residual Symptoms    GI/Hepatic negative GI ROS,   Endo/Other    Renal/GU      Musculoskeletal   Abdominal   Peds  Hematology   Anesthesia Other Findings   Reproductive/Obstetrics                           Anesthesia Physical Anesthesia Plan  ASA: III  Anesthesia Plan: MAC   Post-op Pain Management:    Induction: Intravenous  Airway Management Planned: Nasal Cannula  Additional Equipment:   Intra-op Plan:   Post-operative Plan:   Informed Consent: I have reviewed the patients History and Physical, chart, labs and discussed the procedure including the risks, benefits and alternatives for the proposed anesthesia with the patient or authorized representative who has indicated his/her understanding and acceptance.     Plan Discussed with:   Anesthesia Plan Comments:         Anesthesia Quick Evaluation

## 2014-02-12 NOTE — Transfer of Care (Signed)
Immediate Anesthesia Transfer of Care Note  Patient: Adam [name]  Procedure(s) Performed: Procedure(s) with comments: CATARACT EXTRACTION PHACO AND INTRAOCULAR LENS PLACEMENT (IOC) (Left) - CDE 6.25  Patient Location: Short Stay  Anesthesia Type:MAC  Level of Consciousness: awake, alert , oriented and patient cooperative  Airway & Oxygen Therapy: Patient Spontanous Breathing  Post-op Assessment: Report given to PACU RN and Post -op Vital signs reviewed and stable  Post vital signs: Reviewed and stable  Complications: No apparent anesthesia complications

## 2014-02-12 NOTE — Op Note (Signed)
See scanned note.

## 2014-02-12 NOTE — Anesthesia Postprocedure Evaluation (Signed)
  Anesthesia Post-op Note  Patient: Adam [name]  Procedure(s) Performed: Procedure(s) with comments: CATARACT EXTRACTION PHACO AND INTRAOCULAR LENS PLACEMENT (IOC) (Left) - CDE 6.25  Patient Location: Short Stay  Anesthesia Type:MAC  Level of Consciousness: awake, alert , oriented and patient cooperative  Airway and Oxygen Therapy: Patient Spontanous Breathing  Post-op Pain: none  Post-op Assessment: Post-op Vital signs reviewed, Patient's Cardiovascular Status Stable, Respiratory Function Stable, Patent Airway, No signs of Nausea or vomiting and Pain level controlled  Post-op Vital Signs: Reviewed and stable  Last Vitals:  Filed Vitals:   02/12/14 0956  BP: 120/59  Pulse:   Temp:   Resp: 20    Complications: No apparent anesthesia complications

## 2014-02-13 ENCOUNTER — Encounter (HOSPITAL_COMMUNITY): Payer: Self-pay | Admitting: Ophthalmology

## 2014-08-27 ENCOUNTER — Ambulatory Visit (HOSPITAL_COMMUNITY): Admission: RE | Admit: 2014-08-27 | Payer: Medicare HMO | Source: Ambulatory Visit | Admitting: Ophthalmology

## 2014-08-27 ENCOUNTER — Encounter (HOSPITAL_COMMUNITY): Admission: RE | Payer: Self-pay | Source: Ambulatory Visit

## 2014-08-27 SURGERY — TREATMENT, USING YAG LASER
Anesthesia: LOCAL | Laterality: Right

## 2014-08-27 NOTE — OR Nursing (Signed)
Patient called and cancelled procedure due to weather

## 2014-09-17 ENCOUNTER — Ambulatory Visit (HOSPITAL_COMMUNITY)
Admission: RE | Admit: 2014-09-17 | Discharge: 2014-09-17 | Disposition: A | Discharge status: Home / Self Care | Payer: Medicare HMO | Source: Ambulatory Visit | Attending: Ophthalmology | Admitting: Ophthalmology

## 2014-09-17 ENCOUNTER — Encounter (HOSPITAL_COMMUNITY): Payer: Self-pay | Admitting: *Deleted

## 2014-09-17 ENCOUNTER — Encounter (HOSPITAL_COMMUNITY)
Admission: RE | Disposition: A | Discharge status: Home / Self Care | Payer: Self-pay | Source: Ambulatory Visit | Attending: Ophthalmology

## 2014-09-17 DIAGNOSIS — H2640 Unspecified secondary cataract: Secondary | ICD-10-CM | POA: Insufficient documentation

## 2014-09-17 HISTORY — PX: YAG LASER APPLICATION: SHX6189

## 2014-09-17 SURGERY — TREATMENT, USING YAG LASER
Anesthesia: LOCAL | Laterality: Right

## 2014-09-17 MED ORDER — APRACLONIDINE HCL 1 % OP SOLN
1.0000 [drp] | OPHTHALMIC | Status: AC
  Administered 2014-09-17 (×3): 1 [drp] via OPHTHALMIC

## 2014-09-17 MED ORDER — TROPICAMIDE 1 % OP SOLN
OPHTHALMIC | Status: AC
  Filled 2014-09-17: qty 3

## 2014-09-17 MED ORDER — APRACLONIDINE HCL 1 % OP SOLN
OPHTHALMIC | Status: AC
  Filled 2014-09-17: qty 0.1

## 2014-09-17 MED ORDER — TROPICAMIDE 1 % OP SOLN
1.0000 [drp] | OPHTHALMIC | Status: AC
  Administered 2014-09-17 (×3): 1 [drp] via OPHTHALMIC

## 2014-09-17 MED ORDER — TETRACAINE HCL 0.5 % OP SOLN
OPHTHALMIC | Status: AC
  Filled 2014-09-17: qty 2

## 2014-09-17 MED ORDER — TETRACAINE HCL 0.5 % OP SOLN
1.0000 [drp] | Freq: Once | OPHTHALMIC | Status: AC
  Administered 2014-09-17: 1 [drp] via OPHTHALMIC

## 2014-09-17 NOTE — Op Note (Signed)
None required

## 2014-09-17 NOTE — Discharge Instructions (Signed)
Adam [name]  09/17/2014     Instructions    Activity: No Restrictions.   Diet: Resume Diet you were on at home.   Pain Medication: Tylenol if Needed.   CONTACT YOUR DOCTOR IF YOU HAVE PAIN, REDNESS IN YOUR EYE, OR DECREASED VISION.   Follow-up:                                                      with Williams Che, MD.    Dr. Iona Hansen: [phone]      If you find that you cannot contact your physician, but feel that your signs and   Symptoms warrant a physician's attention, call the Emergency Room at   [phone] ext.[phone].

## 2014-09-17 NOTE — Brief Op Note (Signed)
TIMOTH SCHARA 09/17/2014  Williams Che, MD  Pre-op Diagnosis:  secondary cataract OD  Post-op Diagnosis:  same  Yag laser self-test completed: Yes.    Indications:  See pre op office H&P  Procedure: YAG Posterior Capsulotomy OD    Eye protection worn by staff:  Yes.   Laser In Use sign on door:  Yes.    Laser:  Lumenis Selecta Duet YAG  Power Setting:  1.0 mJ/burst Anatomical site treated:  Posterior capsule OD Number of applications:  15 Total energy delivered: 14.97 mJ Results:  Visual axis cleared  Patient was instructed to go to the office, as previously scheduled, for intraocular pressure:  Yes.    Patient verbalizes understanding of discharge instructions:  Yes.    Notes:  Patient tolerated procedure well, no complications

## 2014-09-17 NOTE — H&P (Signed)
I have reviewed the pre printed H&P, the patient was re-examined, and I have identified no significant interval changes in the patient's medical condition.  There is no change in the plan of care since the history and physical of record. 

## 2014-09-18 ENCOUNTER — Encounter (HOSPITAL_COMMUNITY): Payer: Self-pay | Admitting: Ophthalmology

## 2014-11-26 HISTORY — PX: EYE SURGERY: SHX253

## 2015-01-04 ENCOUNTER — Encounter: Payer: Self-pay | Admitting: Family

## 2015-01-08 ENCOUNTER — Ambulatory Visit (INDEPENDENT_AMBULATORY_CARE_PROVIDER_SITE_OTHER): Payer: Medicare HMO | Admitting: Family

## 2015-01-08 ENCOUNTER — Ambulatory Visit (HOSPITAL_COMMUNITY)
Admission: RE | Admit: 2015-01-08 | Discharge: 2015-01-08 | Disposition: A | Discharge status: Home / Self Care | Payer: Medicare HMO | Source: Ambulatory Visit | Attending: Family | Admitting: Family

## 2015-01-08 ENCOUNTER — Encounter: Payer: Self-pay | Admitting: Family

## 2015-01-08 VITALS — BP 106/64 | HR 51 | Resp 14 | Ht 70.5 in | Wt 162.0 lb

## 2015-01-08 DIAGNOSIS — I6523 Occlusion and stenosis of bilateral carotid arteries: Secondary | ICD-10-CM

## 2015-01-08 DIAGNOSIS — Z9889 Other specified postprocedural states: Secondary | ICD-10-CM

## 2015-01-08 DIAGNOSIS — Z48812 Encounter for surgical aftercare following surgery on the circulatory system: Secondary | ICD-10-CM

## 2015-01-08 NOTE — Patient Instructions (Signed)
Stroke Prevention Some medical conditions and behaviors are associated with an increased chance of having a stroke. You may prevent a stroke by making healthy choices and managing medical conditions. HOW CAN I REDUCE MY RISK OF HAVING A STROKE?   Stay physically active. Get at least 30 minutes of activity on most or all days.  Do not smoke. It may also be helpful to avoid exposure to secondhand smoke.  Limit alcohol use. Moderate alcohol use is considered to be:  No more than 2 drinks per day for men.  No more than 1 drink per day for nonpregnant women.  Eat healthy foods. This involves:  Eating 5 or more servings of fruits and vegetables a day.  Making dietary changes that address high blood pressure (hypertension), high cholesterol, diabetes, or obesity.  Manage your cholesterol levels.  Making food choices that are high in fiber and low in saturated fat, trans fat, and cholesterol may control cholesterol levels.  Take any prescribed medicines to control cholesterol as directed by your health care provider.  Manage your diabetes.  Controlling your carbohydrate and sugar intake is recommended to manage diabetes.  Take any prescribed medicines to control diabetes as directed by your health care provider.  Control your hypertension.  Making food choices that are low in salt (sodium), saturated fat, trans fat, and cholesterol is recommended to manage hypertension.  Take any prescribed medicines to control hypertension as directed by your health care provider.  Maintain a healthy weight.  Reducing calorie intake and making food choices that are low in sodium, saturated fat, trans fat, and cholesterol are recommended to manage weight.  Stop drug abuse.  Avoid taking birth control pills.  Talk to your health care provider about the risks of taking birth control pills if you are over 35 years old, smoke, get migraines, or have ever had a blood clot.  Get evaluated for sleep  disorders (sleep apnea).  Talk to your health care provider about getting a sleep evaluation if you snore a lot or have excessive sleepiness.  Take medicines only as directed by your health care provider.  For some people, aspirin or blood thinners (anticoagulants) are helpful in reducing the risk of forming abnormal blood clots that can lead to stroke. If you have the irregular heart rhythm of atrial fibrillation, you should be on a blood thinner unless there is a good reason you cannot take them.  Understand all your medicine instructions.  Make sure that other conditions (such as anemia or atherosclerosis) are addressed. SEEK IMMEDIATE MEDICAL CARE IF:   You have sudden weakness or numbness of the face, arm, or leg, especially on one side of the body.  Your face or eyelid droops to one side.  You have sudden confusion.  You have trouble speaking (aphasia) or understanding.  You have sudden trouble seeing in one or both eyes.  You have sudden trouble walking.  You have dizziness.  You have a loss of balance or coordination.  You have a sudden, severe headache with no known cause.  You have new chest pain or an irregular heartbeat. Any of these symptoms may represent a serious problem that is an emergency. Do not wait to see if the symptoms will go away. Get medical help at once. Call your local emergency services (911 in [place] Do not drive yourself to the hospital. Document Released: 08/06/2004 Document Revised: 11/13/2013 Document Reviewed: 12/30/2012 [employer] Patient Information 2015 [employer]. This information is not intended to replace advice given   to you by your health care provider. Make sure you discuss any questions you have with your health care provider.  

## 2015-01-08 NOTE — Progress Notes (Signed)
Established Carotid Patient   History of Present Illness  Adam [name] is a 74 y.o. male patient of Dr. Donnetta Hutching who is s/p right carotid endarterectomy in April 2012. He had preoperative transient right eye visual changes. He returns for routine follow up.  Specifically the patient denies a history of amaurosis fugax or monocular blindness, denies a history unilateral  of facial drooping, denies a history of hemiplegia, and denies a history of receptive or expressive aphasia.    The patient reports New Medical or Surgical History: left eye vitrectomy for detached retina  Pt Diabetic: no Pt smoker: non-smoker  Pt meds include: Statin : yes ASA: yes Other anticoagulants/antiplatelets: no   Past Medical History  Diagnosis Date  . Hyperlipemia   . PONV (postoperative nausea and vomiting)   . Stroke     Right Eye    Social History History  Substance Use Topics  . Smoking status: Never Smoker   . Smokeless tobacco: Never Used  . Alcohol Use: No    Family History Family History  Problem Relation Age of Onset  . Anesthesia problems Neg Hx   . Hypotension Neg Hx   . Malignant hyperthermia Neg Hx   . Pseudochol deficiency Neg Hx     Surgical History Past Surgical History  Procedure Laterality Date  . Cystoscopy  1970    [facility]  . [place]  . Irrigation and debridement sebaceous cyst  [place], [place]    [place], [place], in [facility] office  . Skin cancer excision  2010    pre cancerous tissue:nose, scalp, DR. Hall's office  . Eye surgery  06/30/2010    detached retina, [facility]  . Carotid endarterectomy  10/03/10    [place]  . Cataract extraction w/phaco  01/26/2011    Procedure: CATARACT EXTRACTION PHACO AND INTRAOCULAR LENS PLACEMENT (IOC);  Surgeon: Williams Che;  Location: [facility];  Service: Ophthalmology;  Laterality: Right;  CDE:8.64  . Cataract extraction w/phaco Left 02/12/2014    Procedure: CATARACT EXTRACTION PHACO AND INTRAOCULAR LENS PLACEMENT (IOC);   Surgeon: Williams Che, MD;  Location: [facility];  Service: Ophthalmology;  Laterality: Left;  CDE 6.25  . Yag laser application Right 10/11/6604    Procedure: YAG LASER APPLICATION;  Surgeon: Williams Che, MD;  Location: [facility];  Service: Ophthalmology;  Laterality: Right;    No Known Allergies  Current Outpatient Prescriptions  Medication Sig Dispense Refill  . aspirin 162 MG EC tablet Take 162 mg by mouth daily.    Marland Kitchen b complex vitamins tablet Take 1 tablet by mouth daily.    . calcium carbonate (TUMS - DOSED IN MG ELEMENTAL CALCIUM) 500 MG chewable tablet Chew 1 tablet by mouth daily.    . cholecalciferol (VITAMIN D) 1000 UNITS tablet Take 1,000-3,000 Units by mouth daily.    . Chromium Picolinate 500 MCG CAPS Take 500 mcg by mouth daily.    . Chromium-Cinnamon 50-500 MCG-MG CAPS Take 2 capsules by mouth daily.    . Ferrous Sulfate (IRON) 325 (65 FE) MG TABS Take 325 mg by mouth once a week.    Marland Kitchen ibuprofen (ADVIL,MOTRIN) 200 MG tablet Take 200 mg by mouth every 8 (eight) hours as needed. For pain     . MAGNESIUM PO Take 250 mg by mouth once a week.     . Multiple Vitamins-Minerals (MULTIVITAMIN WITH MINERALS) tablet Take 1 tablet by mouth daily.      . niacin 500 MG tablet  Take 500 mg by mouth once a week.    Marland Kitchen PRESCRIPTION MEDICATION Take 200 mg by mouth once a week.    . simvastatin (ZOCOR) 20 MG tablet Take 40 mg by mouth at bedtime.     . timolol (BETIMOL) 0.5 % ophthalmic solution Place 1 drop into the right eye daily.     . vitamin C (ASCORBIC ACID) 500 MG tablet Take 1,000 mg by mouth daily.    . vitamin E 400 UNIT capsule Take 800 Units by mouth daily.     Marland Kitchen zinc gluconate 50 MG tablet Take 25 mg by mouth once a week.     No current facility-administered medications for this visit.    Review of Systems : See HPI for pertinent positives and negatives.  Physical Examination  Filed Vitals:   01/08/15 1350 01/08/15 1353  BP: 120/68 106/64  Pulse: 50 51  Resp:  14   Height:  5' 10.5" (1.791 m)  Weight:  162 lb (73.483 kg)  SpO2:  100%   Body mass index is 22.91 kg/(m^2).  General: WDWN male in NAD GAIT: normal Eyes: PERRLA Pulmonary:  Non-labored, CTAB, Negative  Rales, Negative rhonchi, & Negative wheezing.  Cardiac: regular rhythm,  no detected murmur.  VASCULAR EXAM Carotid Bruits Right Left   Negative Negative    Aorta is not palpable. Radial pulses are 2+ palpable and equal.                                                                                                                            LE Pulses Right Left       POPLITEAL  not palpable   not palpable       POSTERIOR TIBIAL   palpable    palpable        DORSALIS PEDIS      ANTERIOR TIBIAL  palpable   palpable     Gastrointestinal: soft, nontender, BS WNL, no r/g,  no palpable masses.  Musculoskeletal: Negative muscle atrophy/wasting. M/S 5/5 throughout, extremities without ischemic changes.  Neurologic: A&O X 3; Appropriate Affect, Speech is normal CN 2-12 intact, pain and light touch intact in extremities, motor exam as listed above.   Non-Invasive Vascular Imaging CAROTID DUPLEX 01/08/2015   CEREBROVASCULAR DUPLEX EVALUATION    INDICATION: Follow-up carotid disease     PREVIOUS INTERVENTION(S): Right carotid endarterectomy 10/02/2010    DUPLEX EXAM:     RIGHT  LEFT  Peak Systolic Velocities (cm/s) End Diastolic Velocities (cm/s) Plaque LOCATION Peak Systolic Velocities (cm/s) End Diastolic Velocities (cm/s) Plaque  90 17  CCA PROXIMAL 126 24   82 19  CCA MID 108 23   70 15  CCA DISTAL 78 21   66 9  ECA 111 11   48 14  ICA PROXIMAL 52 14   57 20  ICA MID 70 24   53 20  ICA DISTAL 53 19     NA ICA / CCA Ratio (  PSV)   Antegrade  Vertebral Flow Antegrade    Brachial Systolic Pressure (mmHg)   Within normal limits  Brachial Artery Waveforms Within normal limits     Plaque Morphology:  HM = Homogeneous, HT = Heterogeneous, CP = Calcific Plaque, SP =  Smooth Plaque, IP = Irregular Plaque  ADDITIONAL FINDINGS:     IMPRESSION: 1. Widely patent right carotid endarterectomy without evidence of restenosis or hyperplasia.  2. No appreciable disease is observed in the left internal carotid artery. 3. Bilateral vertebral artery is antegrade.    Compared to the previous exam:  No significant change compared to prior exam.      Assessment: BRADYN SOWARD is a 74 y.o. male who is s/p right carotid endarterectomy in April 2012. He had a right eye TIA with transient amaurosis fugax just prior to the right CEA. He has had no subsequent TIA or stroke. Today's carotid Duplex suggests a widely patent right carotid endarterectomy without evidence of restenosis or hyperplasia.  No appreciable disease is observed in the left internal carotid artery. No significant change compared to prior exam.    Plan: Follow-up in 1 year with Carotid Duplex.   I discussed in depth with the patient the nature of atherosclerosis, and emphasized the importance of maximal medical management including strict control of blood pressure, blood glucose, and lipid levels, obtaining regular exercise, and continued cessation of smoking.  The patient is aware that without maximal medical management the underlying atherosclerotic disease process will progress, limiting the benefit of any interventions. The patient was given information about stroke prevention and what symptoms should prompt the patient to seek immediate medical care. Thank you for allowing [place] to participate in this patient's care.  Clemon Chambers, RN, MSN, FNP-C [facility] Office: [phone]  Clinic Physician: Early  01/08/2015 1:35 PM

## 2015-06-13 DIAGNOSIS — H59813 Chorioretinal scars after surgery for detachment, bilateral: Secondary | ICD-10-CM | POA: Diagnosis not present

## 2015-06-13 DIAGNOSIS — H31009 Unspecified chorioretinal scars, unspecified eye: Secondary | ICD-10-CM | POA: Diagnosis not present

## 2015-06-13 DIAGNOSIS — H35361 Drusen (degenerative) of macula, right eye: Secondary | ICD-10-CM | POA: Diagnosis not present

## 2015-08-22 ENCOUNTER — Other Ambulatory Visit (HOSPITAL_COMMUNITY): Payer: Self-pay | Admitting: Internal Medicine

## 2015-08-22 DIAGNOSIS — R229 Localized swelling, mass and lump, unspecified: Principal | ICD-10-CM

## 2015-08-22 DIAGNOSIS — IMO0002 Reserved for concepts with insufficient information to code with codable children: Secondary | ICD-10-CM

## 2015-08-22 DIAGNOSIS — N509 Disorder of male genital organs, unspecified: Secondary | ICD-10-CM | POA: Diagnosis not present

## 2015-08-23 ENCOUNTER — Other Ambulatory Visit (HOSPITAL_COMMUNITY): Payer: Self-pay | Admitting: Internal Medicine

## 2015-08-23 DIAGNOSIS — R229 Localized swelling, mass and lump, unspecified: Principal | ICD-10-CM

## 2015-08-23 DIAGNOSIS — IMO0002 Reserved for concepts with insufficient information to code with codable children: Secondary | ICD-10-CM

## 2015-08-26 ENCOUNTER — Ambulatory Visit (HOSPITAL_COMMUNITY)
Admission: RE | Admit: 2015-08-26 | Discharge: 2015-08-26 | Disposition: A | Discharge status: Home / Self Care | Payer: Medicare HMO | Source: Ambulatory Visit | Attending: Internal Medicine | Admitting: Internal Medicine

## 2015-08-26 DIAGNOSIS — R229 Localized swelling, mass and lump, unspecified: Secondary | ICD-10-CM | POA: Insufficient documentation

## 2015-08-26 DIAGNOSIS — IMO0002 Reserved for concepts with insufficient information to code with codable children: Secondary | ICD-10-CM

## 2015-08-26 DIAGNOSIS — N433 Hydrocele, unspecified: Secondary | ICD-10-CM | POA: Diagnosis not present

## 2015-09-11 ENCOUNTER — Ambulatory Visit (INDEPENDENT_AMBULATORY_CARE_PROVIDER_SITE_OTHER): Payer: Medicare HMO | Admitting: Urology

## 2015-09-11 DIAGNOSIS — N50812 Left testicular pain: Secondary | ICD-10-CM | POA: Diagnosis not present

## 2015-09-11 DIAGNOSIS — N509 Disorder of male genital organs, unspecified: Secondary | ICD-10-CM

## 2015-09-12 ENCOUNTER — Other Ambulatory Visit: Payer: Self-pay | Admitting: Urology

## 2015-09-19 DIAGNOSIS — E1100 Type 2 diabetes mellitus with hyperosmolarity without nonketotic hyperglycemic-hyperosmolar coma (NKHHC): Secondary | ICD-10-CM | POA: Diagnosis not present

## 2015-09-19 DIAGNOSIS — E782 Mixed hyperlipidemia: Secondary | ICD-10-CM | POA: Diagnosis not present

## 2015-09-20 NOTE — Patient Instructions (Addendum)
YOUR PROCEDURE IS SCHEDULED ON :  09/30/15  REPORT TO [facility] MAIN ENTRANCE FOLLOW SIGNS TO EAST ELEVATOR - GO TO 3rd FLOOR CHECK IN AT 3 EAST NURSES STATION (SHORT STAY) AT:  11:30 AM  CALL THIS NUMBER IF YOU HAVE PROBLEMS THE MORNING OF SURGERY [phone]  REMEMBER:ONLY 1 PER PERSON MAY GO TO SHORT STAY WITH YOU TO GET READY THE MORNING OF YOUR SURGERY  DO NOT EAT FOOD OR DRINK LIQUIDS AFTER MIDNIGHT  TAKE THESE MEDICINES THE MORNING OF SURGERY: NONE (USE EYE DROPS AS USUAL)  YOU MAY NOT HAVE ANY METAL ON YOUR BODY INCLUDING HAIR PINS AND PIERCING'S. DO NOT WEAR JEWELRY, MAKEUP, LOTIONS, POWDERS OR PERFUMES. DO NOT WEAR NAIL POLISH. DO NOT SHAVE 48 HRS PRIOR TO SURGERY. MEN MAY SHAVE FACE AND NECK.  DO NOT [place]. [facility] IS NOT RESPONSIBLE FOR VALUABLES.  CONTACTS, DENTURES OR PARTIALS MAY NOT BE WORN TO SURGERY. LEAVE SUITCASE IN CAR. CAN BE BROUGHT TO ROOM AFTER SURGERY.  PATIENTS DISCHARGED THE DAY OF SURGERY WILL NOT BE ALLOWED TO DRIVE HOME.  PLEASE READ OVER THE FOLLOWING INSTRUCTION SHEETS _________________________________________________________________________________                                          [facility] - PREPARING FOR SURGERY  Before surgery, you can play an important role.  Because skin is not sterile, your skin needs to be as free of germs as possible.  You can reduce the number of germs on your skin by washing with CHG (chlorahexidine gluconate) soap before surgery.  CHG is an antiseptic cleaner which kills germs and bonds with the skin to continue killing germs even after washing. Please DO NOT use if you have an allergy to CHG or antibacterial soaps.  If your skin becomes reddened/irritated stop using the CHG and inform your nurse when you arrive at Short Stay. Do not shave (including legs and underarms) for at least 48 hours prior to the first CHG shower.  You may shave your face. Please follow these  instructions carefully:   1.  Shower with CHG Soap the night before surgery and the  morning of Surgery.   2.  If you choose to wash your hair, wash your hair first as usual with your  normal  Shampoo.   3.  After you shampoo, rinse your hair and body thoroughly to remove the  shampoo.                                         4.  Use CHG as you would any other liquid soap.  You can apply chg directly  to the skin and wash . Gently wash with scrungie or clean wascloth    5.  Apply the CHG Soap to your body ONLY FROM THE NECK DOWN.   Do not use on open                           Wound or open sores. Avoid contact with eyes, ears mouth and genitals (private parts).                        Genitals (private parts) with  your normal soap.              6.  Wash thoroughly, paying special attention to the area where your surgery  will be performed.   7.  Thoroughly rinse your body with warm water from the neck down.   8.  DO NOT shower/wash with your normal soap after using and rinsing off  the CHG Soap .                9.  Pat yourself dry with a clean towel.             10.  Wear clean night clothes to bed after shower             11.  Place clean sheets on your bed the night of your first shower and do not  sleep with pets.  Day of Surgery : Do not apply any lotions/deodorants the morning of surgery.  Please wear clean clothes to the hospital/surgery center.  FAILURE TO FOLLOW THESE INSTRUCTIONS MAY RESULT IN THE CANCELLATION OF YOUR SURGERY    PATIENT SIGNATURE______________________________________________________________  ______________________________________________________________________                      MAY HAVE CLEAR LIQUIDS UNTIL 7:30 AM    CLEAR LIQUID DIET   Foods Allowed                                                                     Foods Excluded  Coffee and tea, regular and decaf                             liquids that you cannot   Plain Jell-O in any flavor                                             see through such as: Fruit ices (not with fruit pulp)                                     milk, soups, orange juice  Iced Popsicles                                                            All solid food Carbonated beverages, regular and diet                                    Cranberry, grape and apple juices Sports drinks like Gatorade Lightly seasoned clear broth or consume(fat free) Sugar, honey syrup   _____________________________________________________________________

## 2015-09-24 ENCOUNTER — Encounter (HOSPITAL_COMMUNITY)
Admission: RE | Admit: 2015-09-24 | Discharge: 2015-09-24 | Disposition: A | Discharge status: Home / Self Care | Payer: Medicare HMO | Source: Ambulatory Visit | Attending: Urology | Admitting: Urology

## 2015-09-24 ENCOUNTER — Other Ambulatory Visit (HOSPITAL_COMMUNITY): Payer: Medicare HMO

## 2015-09-24 ENCOUNTER — Encounter (HOSPITAL_COMMUNITY): Payer: Self-pay

## 2015-09-24 ENCOUNTER — Other Ambulatory Visit: Payer: Self-pay

## 2015-09-24 DIAGNOSIS — Z01812 Encounter for preprocedural laboratory examination: Secondary | ICD-10-CM | POA: Diagnosis not present

## 2015-09-24 DIAGNOSIS — E119 Type 2 diabetes mellitus without complications: Secondary | ICD-10-CM | POA: Diagnosis not present

## 2015-09-24 DIAGNOSIS — R001 Bradycardia, unspecified: Secondary | ICD-10-CM | POA: Diagnosis not present

## 2015-09-24 DIAGNOSIS — Z01818 Encounter for other preprocedural examination: Secondary | ICD-10-CM | POA: Insufficient documentation

## 2015-09-24 DIAGNOSIS — N509 Disorder of male genital organs, unspecified: Secondary | ICD-10-CM | POA: Diagnosis not present

## 2015-09-24 HISTORY — DX: Personal history of urinary calculi: Z87.442

## 2015-09-24 HISTORY — DX: Other specified disorders of the male genital organs: N50.89

## 2015-09-24 HISTORY — DX: Prediabetes: R73.03

## 2015-09-24 LAB — BASIC METABOLIC PANEL
Anion gap: 8 (ref 5–15)
BUN: 18 mg/dL (ref 6–20)
CALCIUM: 9.5 mg/dL (ref 8.9–10.3)
CO2: 29 mmol/L (ref 22–32)
CREATININE: 0.86 mg/dL (ref 0.61–1.24)
Chloride: 107 mmol/L (ref 101–111)
GFR calc non Af Amer: >60 mL/min (ref >60)
Glucose, Bld: 106 mg/dL — ABNORMAL HIGH (ref 65–99)
Potassium: 5.2 mmol/L — ABNORMAL HIGH (ref 3.5–5.1)
SODIUM: 144 mmol/L (ref 135–145)

## 2015-09-24 LAB — CBC
HEMATOCRIT: 42 % (ref 39.0–52.0)
Hemoglobin: 13.7 g/dL (ref 13.0–17.0)
MCH: 31.6 pg (ref 26.0–34.0)
MCHC: 32.6 g/dL (ref 30.0–36.0)
MCV: 97 fL (ref 78.0–100.0)
Platelets: 222 10*3/uL (ref 150–400)
RBC: 4.33 MIL/uL (ref 4.22–5.81)
RDW: 12.4 % (ref 11.5–15.5)
WBC: 7 10*3/uL (ref 4.0–10.5)

## 2015-09-25 DIAGNOSIS — E119 Type 2 diabetes mellitus without complications: Secondary | ICD-10-CM | POA: Diagnosis not present

## 2015-09-25 DIAGNOSIS — E782 Mixed hyperlipidemia: Secondary | ICD-10-CM | POA: Diagnosis not present

## 2015-09-27 NOTE — Progress Notes (Signed)
Pt notified of surgery time change to 11:00 am - instructed to arrive at 8:30 am - NPO after midnight

## 2015-09-30 ENCOUNTER — Encounter (HOSPITAL_COMMUNITY): Payer: Self-pay | Admitting: *Deleted

## 2015-09-30 ENCOUNTER — Encounter (HOSPITAL_COMMUNITY)
Admission: RE | Disposition: A | Discharge status: Home / Self Care | Payer: Self-pay | Source: Ambulatory Visit | Attending: Urology

## 2015-09-30 ENCOUNTER — Ambulatory Visit (HOSPITAL_COMMUNITY): Payer: Medicare HMO | Admitting: Anesthesiology

## 2015-09-30 ENCOUNTER — Ambulatory Visit (HOSPITAL_COMMUNITY)
Admission: RE | Admit: 2015-09-30 | Discharge: 2015-09-30 | Disposition: A | Discharge status: Home / Self Care | Payer: Medicare HMO | Source: Ambulatory Visit | Attending: Urology | Admitting: Urology

## 2015-09-30 DIAGNOSIS — N5089 Other specified disorders of the male genital organs: Secondary | ICD-10-CM | POA: Diagnosis not present

## 2015-09-30 DIAGNOSIS — Z791 Long term (current) use of non-steroidal anti-inflammatories (NSAID): Secondary | ICD-10-CM | POA: Diagnosis not present

## 2015-09-30 DIAGNOSIS — Z79899 Other long term (current) drug therapy: Secondary | ICD-10-CM | POA: Diagnosis not present

## 2015-09-30 DIAGNOSIS — D4012 Neoplasm of uncertain behavior of left testis: Secondary | ICD-10-CM | POA: Diagnosis not present

## 2015-09-30 DIAGNOSIS — N433 Hydrocele, unspecified: Secondary | ICD-10-CM | POA: Diagnosis not present

## 2015-09-30 DIAGNOSIS — Z8673 Personal history of transient ischemic attack (TIA), and cerebral infarction without residual deficits: Secondary | ICD-10-CM | POA: Diagnosis not present

## 2015-09-30 DIAGNOSIS — Z7982 Long term (current) use of aspirin: Secondary | ICD-10-CM | POA: Insufficient documentation

## 2015-09-30 HISTORY — PX: TESTICULAR EXPLORATION: SHX5145

## 2015-09-30 HISTORY — PX: HYDROCELE EXCISION: SHX482

## 2015-09-30 SURGERY — EXPLORATION, TESTICLE
Anesthesia: General | Laterality: Left

## 2015-09-30 MED ORDER — LIDOCAINE HCL (CARDIAC) 20 MG/ML IV SOLN
INTRAVENOUS | Status: DC | PRN
  Administered 2015-09-30: 100 mg via INTRAVENOUS

## 2015-09-30 MED ORDER — LIDOCAINE HCL (CARDIAC) 20 MG/ML IV SOLN
INTRAVENOUS | Status: AC
  Filled 2015-09-30: qty 5

## 2015-09-30 MED ORDER — HYDROCODONE-ACETAMINOPHEN 5-325 MG PO TABS: 1.0000 | ORAL_TABLET | Freq: Four times a day (QID) | ORAL | Status: DC | PRN

## 2015-09-30 MED ORDER — FENTANYL CITRATE (PF) 100 MCG/2ML IJ SOLN
INTRAMUSCULAR | Status: AC
  Filled 2015-09-30: qty 2

## 2015-09-30 MED ORDER — MIDAZOLAM HCL 5 MG/5ML IJ SOLN
INTRAMUSCULAR | Status: DC | PRN
  Administered 2015-09-30: 2 mg via INTRAVENOUS

## 2015-09-30 MED ORDER — ONDANSETRON HCL 4 MG/2ML IJ SOLN
INTRAMUSCULAR | Status: DC | PRN
  Administered 2015-09-30: 4 mg via INTRAVENOUS

## 2015-09-30 MED ORDER — BUPIVACAINE HCL (PF) 0.25 % IJ SOLN: INTRAMUSCULAR | Status: DC | PRN

## 2015-09-30 MED ORDER — PROPOFOL 10 MG/ML IV BOLUS
INTRAVENOUS | Status: AC
  Filled 2015-09-30: qty 20

## 2015-09-30 MED ORDER — DEXAMETHASONE SODIUM PHOSPHATE 10 MG/ML IJ SOLN
INTRAMUSCULAR | Status: AC
  Filled 2015-09-30: qty 1

## 2015-09-30 MED ORDER — MIDAZOLAM HCL 2 MG/2ML IJ SOLN
INTRAMUSCULAR | Status: AC
  Filled 2015-09-30: qty 2

## 2015-09-30 MED ORDER — CEFAZOLIN SODIUM-DEXTROSE 2-3 GM-% IV SOLR
INTRAVENOUS | Status: AC
  Filled 2015-09-30: qty 50

## 2015-09-30 MED ORDER — ACETAMINOPHEN 160 MG/5ML PO SOLN
650.0000 mg | Freq: Once | ORAL | Status: AC
  Administered 2015-09-30: 650 mg via ORAL
  Filled 2015-09-30: qty 20.3

## 2015-09-30 MED ORDER — PHENYLEPHRINE 40 MCG/ML (10ML) SYRINGE FOR IV PUSH (FOR BLOOD PRESSURE SUPPORT)
PREFILLED_SYRINGE | INTRAVENOUS | Status: AC
  Filled 2015-09-30: qty 10

## 2015-09-30 MED ORDER — FENTANYL CITRATE (PF) 100 MCG/2ML IJ SOLN
INTRAMUSCULAR | Status: DC | PRN
  Administered 2015-09-30: 25 ug via INTRAVENOUS
  Administered 2015-09-30: 50 ug via INTRAVENOUS
  Administered 2015-09-30: 25 ug via INTRAVENOUS

## 2015-09-30 MED ORDER — GLYCOPYRROLATE 0.2 MG/ML IJ SOLN
INTRAMUSCULAR | Status: DC | PRN
  Administered 2015-09-30: 0.2 mg via INTRAVENOUS

## 2015-09-30 MED ORDER — GLYCOPYRROLATE 0.2 MG/ML IJ SOLN
INTRAMUSCULAR | Status: AC
  Filled 2015-09-30: qty 1

## 2015-09-30 MED ORDER — ONDANSETRON HCL 4 MG/2ML IJ SOLN
INTRAMUSCULAR | Status: AC
  Filled 2015-09-30: qty 2

## 2015-09-30 MED ORDER — BUPIVACAINE HCL (PF) 0.25 % IJ SOLN
INTRAMUSCULAR | Status: AC
  Filled 2015-09-30: qty 30

## 2015-09-30 MED ORDER — PHENYLEPHRINE HCL 10 MG/ML IJ SOLN
INTRAMUSCULAR | Status: DC | PRN
  Administered 2015-09-30 (×4): 80 ug via INTRAVENOUS

## 2015-09-30 MED ORDER — PROPOFOL 10 MG/ML IV BOLUS
INTRAVENOUS | Status: DC | PRN
  Administered 2015-09-30: 150 mg via INTRAVENOUS

## 2015-09-30 MED ORDER — LACTATED RINGERS IV SOLN
INTRAVENOUS | Status: DC | PRN
  Administered 2015-09-30 (×2): via INTRAVENOUS

## 2015-09-30 MED ORDER — CEFAZOLIN SODIUM-DEXTROSE 2-3 GM-% IV SOLR
2.0000 g | INTRAVENOUS | Status: AC
  Administered 2015-09-30: 2 g via INTRAVENOUS

## 2015-09-30 MED ORDER — FENTANYL CITRATE (PF) 100 MCG/2ML IJ SOLN
25.0000 ug | INTRAMUSCULAR | Status: DC | PRN
  Administered 2015-09-30 (×3): 50 ug via INTRAVENOUS

## 2015-09-30 MED ORDER — HYDROCODONE-ACETAMINOPHEN 5-325 MG PO TABS
1.0000 | ORAL_TABLET | Freq: Four times a day (QID) | ORAL | Status: DC | PRN
  Administered 2015-09-30: 1 via ORAL
  Filled 2015-09-30: qty 1

## 2015-09-30 MED ORDER — DEXAMETHASONE SODIUM PHOSPHATE 10 MG/ML IJ SOLN
INTRAMUSCULAR | Status: DC | PRN
  Administered 2015-09-30: 10 mg via INTRAVENOUS

## 2015-09-30 MED ORDER — 0.9 % SODIUM CHLORIDE (POUR BTL) OPTIME
TOPICAL | Status: DC | PRN
  Administered 2015-09-30: 1000 mL

## 2015-09-30 SURGICAL SUPPLY — 28 items
BNDG GAUZE ELAST 4 BULKY (GAUZE/BANDAGES/DRESSINGS) ×2 IMPLANT
COVER SURGICAL LIGHT HANDLE (MISCELLANEOUS) ×2 IMPLANT
DRAIN PENROSE 18X1/2 LTX STRL (DRAIN) ×2 IMPLANT
DRAIN PENROSE 18X1/4 LTX STRL (WOUND CARE) ×2 IMPLANT
DRAPE LAPAROTOMY T 98X78 PEDS (DRAPES) ×2 IMPLANT
ELECT REM PT RETURN 9FT ADLT (ELECTROSURGICAL) ×2
ELECTRODE REM PT RTRN 9FT ADLT (ELECTROSURGICAL) ×1 IMPLANT
GAUZE SPONGE 4X4 12PLY STRL (GAUZE/BANDAGES/DRESSINGS) IMPLANT
GLOVE BIO SURGEON STRL SZ8 (GLOVE) ×2 IMPLANT
GLOVE BIOGEL PI IND STRL 8 (GLOVE) ×1 IMPLANT
GLOVE BIOGEL PI INDICATOR 8 (GLOVE) ×1
GOWN STRL REUS W/TWL LRG LVL3 (GOWN DISPOSABLE) ×2 IMPLANT
KIT BASIN OR (CUSTOM PROCEDURE TRAY) ×2 IMPLANT
LIQUID BAND (GAUZE/BANDAGES/DRESSINGS) ×2 IMPLANT
MARKER SKIN DUAL TIP RULER LAB (MISCELLANEOUS) IMPLANT
NEEDLE HYPO 22GX1.5 SAFETY (NEEDLE) IMPLANT
NS IRRIG 1000ML POUR BTL (IV SOLUTION) ×2 IMPLANT
PACK GENERAL/GYN (CUSTOM PROCEDURE TRAY) ×2 IMPLANT
SPONGE LAP 4X18 X RAY DECT (DISPOSABLE) IMPLANT
SUPPORT SCROTAL LG STRP (MISCELLANEOUS) ×2 IMPLANT
SUT MNCRL AB 4-0 PS2 18 (SUTURE) ×2 IMPLANT
SUT VIC AB 2-0 SH 27 (SUTURE) ×2
SUT VIC AB 2-0 SH 27X BRD (SUTURE) ×2 IMPLANT
SUT VICRYL 0 TIES 12 18 (SUTURE) IMPLANT
SYR CONTROL 10ML LL (SYRINGE) IMPLANT
TOWEL OR 17X26 10 PK STRL BLUE (TOWEL DISPOSABLE) ×2 IMPLANT
TOWEL OR NON WOVEN STRL DISP B (DISPOSABLE) ×2 IMPLANT
WATER STERILE IRR 1500ML POUR (IV SOLUTION) IMPLANT

## 2015-09-30 NOTE — Anesthesia Preprocedure Evaluation (Signed)
Anesthesia Evaluation  Patient identified by MRN, date of birth, ID band Patient awake    Reviewed: Allergy & Precautions, H&P , Patient's Chart, lab work & pertinent test results, reviewed documented beta blocker date and time   Airway Mallampati: II  TM Distance: >3 FB Neck ROM: full    Dental no notable dental hx.    Pulmonary    Pulmonary exam normal breath sounds clear to auscultation       Cardiovascular  Rhythm:regular Rate:Normal     Neuro/Psych    GI/Hepatic   Endo/Other    Renal/GU      Musculoskeletal   Abdominal   Peds  Hematology   Anesthesia Other Findings Stroke..... Right Eye.......RESOLVED  ENDARTERECTOMY  Paratesticular mass     History of kidney stones    Borderline diabetic     Reproductive/Obstetrics                             Anesthesia Physical Anesthesia Plan  ASA: II  Anesthesia Plan:    Post-op Pain Management:    Induction: Intravenous  Airway Management Planned: LMA  Additional Equipment:   Intra-op Plan:   Post-operative Plan:   Informed Consent: I have reviewed the patients History and Physical, chart, labs and discussed the procedure including the risks, benefits and alternatives for the proposed anesthesia with the patient or authorized representative who has indicated his/her understanding and acceptance.   Dental Advisory Given and Dental advisory given  Plan Discussed with: CRNA and Surgeon  Anesthesia Plan Comments: (Discussed GA with LMA, possible sore throat, potential need to switch to ETT, N/V, pulmonary aspiration. Questions answered. )        Anesthesia Quick Evaluation

## 2015-09-30 NOTE — Transfer of Care (Signed)
Immediate Anesthesia Transfer of Care Note  Patient: Adam [name]  Procedure(s) Performed: Procedure(s): INGUINAL PARATESTICULAR  MASS REMOVAL (Left) HYDROCELECTOMY ADULT (Left)  Patient Location: PACU  Anesthesia Type:General  Level of Consciousness: sedated  Airway & Oxygen Therapy: Patient Spontanous Breathing and Patient connected to face mask oxygen  Post-op Assessment: Report given to RN and Post -op Vital signs reviewed and stable  Post vital signs: Reviewed and stable  Last Vitals:  Filed Vitals:   09/30/15 0815  BP: 137/64  Pulse: 65  Temp: 36.4 C  Resp: 16    Complications: No apparent anesthesia complications

## 2015-09-30 NOTE — H&P (Signed)
Urology Admission H&P  Chief Complaint: left testicular mass  History of Present Illness: Mr Hamilton Capri is a 75yo with an enlarging right paratesticular mass that has been growing in size. He denies any pain. No LUTS  Past Medical History  Diagnosis Date  . PONV (postoperative nausea and vomiting)   . Hyperlipemia   . Stroke ([place])     Right Eye / VISUAL PROBLEM RESOLVED AFTER ENDARTERECTOMY  . Paratesticular mass   . History of kidney stones   . Borderline diabetic     DIET CONTROLLED   Past Surgical History  Procedure Laterality Date  . Cystoscopy  1970    [place]  . [place]  07[place]  . Irrigation and debridement sebaceous cyst  2001, [place]    [place], [place], in [place] office  . Skin cancer excision  2010    pre cancerous tissue:nose, scalp, DR. Hall's office  . Carotid endarterectomy  10/03/10    [place]  . Cataract extraction w/phaco  01/26/2011    Procedure: CATARACT EXTRACTION PHACO AND INTRAOCULAR LENS PLACEMENT (IOC);  Surgeon: Williams Che;  Location: [facility];  Service: Ophthalmology;  Laterality: Right;  CDE:8.64  . Cataract extraction w/phaco Left 02/12/2014    Procedure: CATARACT EXTRACTION PHACO AND INTRAOCULAR LENS PLACEMENT (IOC);  Surgeon: Williams Che, MD;  Location: [facility];  Service: Ophthalmology;  Laterality: Left;  CDE 6.25  . Yag laser application Right [id number]    Procedure: YAG LASER APPLICATION;  Surgeon: Williams Che, MD;  Location: [facility];  Service: Ophthalmology;  Laterality: Right;  . Eye surgery  06/30/2010    detached retina, [facility]  . Eye surgery Left Nov 26, 2014    Detached Retina    Home Medications:  Prescriptions prior to admission  Medication Sig Dispense Refill Last Dose  . aspirin 325 MG EC tablet Take 162.5 mg by mouth daily.   Past Month at Unknown time  . ibuprofen (ADVIL,MOTRIN) 200 MG tablet Take 200 mg by mouth every 8 (eight) hours as needed. For pain    Past Month at Unknown time  . MAGNESIUM PO Take 250 mg by mouth once a  week.    Past Month at Unknown time  . niacin 500 MG tablet Take 500 mg by mouth once a week.   Past Week at Unknown time  . timolol (BETIMOL) 0.5 % ophthalmic solution Place 1 drop into the right eye daily.    09/29/2015 at Unknown time   Allergies: No Known Allergies  Family History  Problem Relation Age of Onset  . Anesthesia problems Neg Hx   . Hypotension Neg Hx   . Malignant hyperthermia Neg Hx   . Pseudochol deficiency Neg Hx   . Hypertension Father   . Heart disease Father     After age 39   Social History:  reports that he has never smoked. He has never used smokeless tobacco. He reports that he does not drink alcohol or use illicit drugs.  Review of Systems  All other systems reviewed and are negative.   Physical Exam:  Vital signs in last 24 hours: Temp:  [97.5 F (36.4 C)] 97.5 F (36.4 C) (03/20 0815) Pulse Rate:  [65] 65 (03/20 0815) Resp:  [16] 16 (03/20 0815) BP: (137)/(64) 137/64 mmHg (03/20 0815) SpO2:  [100 %] 100 % (03/20 0815) Weight:  [74.844 kg (165 lb)] 74.844 kg (165 lb) (03/20 0900) Physical Exam  Constitutional: He is oriented to person, place, and time.  He appears well-developed and well-nourished.  HENT:  Head: Normocephalic and atraumatic.  Eyes: EOM are normal. Pupils are equal, round, and reactive to light.  Neck: Normal range of motion. No thyromegaly present.  Cardiovascular: Normal rate and regular rhythm.   Respiratory: Effort normal. No respiratory distress.  GI: Soft. He exhibits no distension and no mass. There is no tenderness. There is no rebound and no guarding.  Musculoskeletal: Normal range of motion.  Neurological: He is alert and oriented to person, place, and time.  Skin: Skin is warm and dry.  Psychiatric: He has a normal mood and affect. His behavior is normal. Judgment and thought content normal.    Laboratory Data:  No results found for this or any previous visit (from the past 24 hour(s)). No results found for this or  any previous visit (from the past 240 hour(s)). Creatinine:  Recent Labs  09/24/15 1515  CREATININE 0.86   Baseline Creatinine: 0.9  Impression/Assessment:  75yo with left paratesticular mass  Plan:  The risks/benefits/alternatives to left paratesticular mass excision and possible left radical orchiectomy was explained to the patient and he understands and wishes to proceed with surgery.  Tanaisha Pittman L 09/30/2015, 9:41 AM

## 2015-09-30 NOTE — Discharge Instructions (Signed)
Hydrocelectomy, Care After °Refer to this sheet in the next few weeks. These instructions provide you with information about caring for yourself after your procedure. Your health care provider may also give you more specific instructions. Your treatment has been planned according to current medical practices, but problems sometimes occur. Call your health care provider if you have any problems or questions after your procedure. °WHAT TO EXPECT AFTER THE PROCEDURE °After your procedure, it is common for the pouch that holds your testicles (scrotum) to be painful, swollen, and bruised. °HOME CARE INSTRUCTIONS °Bathing °· Ask your health care provider when you can shower, take baths, or go swimming. °· If you were told to wear an athletic support strap, take it off when you shower or take a bath. °Incision Care °· Follow instructions from your health care provider about how to take care of your incision. Make sure you: °¨ Wash your hands with soap and water before you change your bandage (dressing). If soap and water are not available, use hand sanitizer. °¨ Change your dressing as told by your health care provider. °¨ Leave stitches (sutures) in place. °· Check your incision and scrotum every day for signs of infection. Check for: °¨ More redness, swelling, or pain. °¨ Blood or fluid. °¨ Warmth. °¨ Pus or a bad smell. °Managing Pain, Stiffness, and Swelling °· If directed, apply ice to the injured area: °¨ Put ice in a plastic bag. °¨ Place a towel between your skin and the bag. °¨ Leave the ice on for 20 minutes, 2-3 times per day. °Driving °· Do not drive for 24 hours if you received a sedative. °· Do not drive or operate heavy machinery while taking prescription pain medicine. °· Ask your health care provider when it is safe to drive. °Activity °· Do not do any activities that require great strength and energy (are vigorous) for as long as told by your health care provider. °· Return to your normal activities as  told by your health care provider. Ask your health care provider what activities are safe for you. °· Do not lift anything that is heavier than 10 lb (4.5 kg) until your health care provider says that it is safe. °General Instructions °· Take over-the-counter and prescription medicines only as told by your health care provider. °· Keep all follow-up visits as told by your health care provider. This is important. °· If you were given an athletic support strap, wear it as told by your health care provider. °· If you had a drain put in during the procedure, you will need to return to have it removed. °SEEK MEDICAL CARE IF: °· Your pain gets worse. °· You have more redness, swelling, or pain around your scrotum. °· You have blood or fluid coming from your scrotum. °· Your incision feels warm to the touch. °· You have pus or a bad smell coming from your scrotum. °· You have a fever. °  °This information is not intended to replace advice given to you by your health care provider. Make sure you discuss any questions you have with your health care provider. °  °Document Released: 03/20/2015 Document Reviewed: 12/26/2014 °Elsevier Interactive Patient Education ©2016 [employer] ° ° ° ° °General Anesthesia, Adult, Care After °Refer to this sheet in the next few weeks. These instructions provide you with information on caring for yourself after your procedure. Your health care provider may also give you more specific instructions. Your treatment has been planned according to current medical   practices, but problems sometimes occur. Call your health care provider if you have any problems or questions after your procedure. °WHAT TO EXPECT AFTER THE PROCEDURE °After the procedure, it is typical to experience: °Sleepiness. °Nausea and vomiting. °HOME CARE INSTRUCTIONS °For the first 24 hours after general anesthesia: °Have a responsible person with you. °Do not drive a car. If you are alone, do not take public transportation. °Do  not drink alcohol. °Do not take medicine that has not been prescribed by your health care provider. °Do not sign important papers or make important decisions. °You may resume a normal diet and activities as directed by your health care provider. °Change bandages (dressings) as directed. °If you have questions or problems that seem related to general anesthesia, call the hospital and ask for the anesthetist or anesthesiologist on call. °SEEK MEDICAL CARE IF: °You have nausea and vomiting that continue the day after anesthesia. °You develop a rash. °SEEK IMMEDIATE MEDICAL CARE IF:  °You have difficulty breathing. °You have chest pain. °You have any allergic problems. °  °This information is not intended to replace advice given to you by your health care provider. Make sure you discuss any questions you have with your health care provider. °  °Document Released: 10/05/2000 Document Revised: 07/20/2014 Document Reviewed: 10/28/2011 °Elsevier Interactive Patient Education ©2016 [employer] ° °

## 2015-09-30 NOTE — Brief Op Note (Signed)
09/30/2015  11:21 AM  PATIENT:  Adam [name]  75 y.o. male  PRE-OPERATIVE DIAGNOSIS:  LEFT PARATESTICULAR MASS  POST-OPERATIVE DIAGNOSIS:  LEFT PARATESTICULAR MASS  PROCEDURE:  Procedure(s): INGUINAL PARATESTICULAR  MASS REMOVAL (Left) HYDROCELECTOMY ADULT (Left)  SURGEON:  Surgeon(s) and Role:    * Cleon Gustin, MD - Primary  PHYSICIAN ASSISTANT:   ASSISTANTS: none   ANESTHESIA:   general  EBL:  Total I/O In: 1000 [I.V.:1000] Out: -   BLOOD ADMINISTERED:none  DRAINS: none   LOCAL MEDICATIONS USED:  NONE  SPECIMEN:  Source of Specimen:  left paratesticular mass  DISPOSITION OF SPECIMEN:  PATHOLOGY  COUNTS:  YES  TOURNIQUET:  * No tourniquets in log *  DICTATION: .Note written in EPIC  PLAN OF CARE: Discharge to home after PACU  PATIENT DISPOSITION:  PACU - hemodynamically stable.   Delay start of Pharmacological VTE agent (>24hrs) due to surgical blood loss or risk of bleeding: not applicable

## 2015-10-01 LAB — GLUCOSE, CAPILLARY: GLUCOSE-CAPILLARY: 103 mg/dL — AB (ref 65–99)

## 2015-10-01 NOTE — Anesthesia Postprocedure Evaluation (Signed)
Anesthesia Post Note  Patient: Adam [name]  Procedure(s) Performed: Procedure(s) (LRB): INGUINAL PARATESTICULAR  MASS REMOVAL (Left) HYDROCELECTOMY ADULT (Left)  Patient location during evaluation: PACU Anesthesia Type: General Level of consciousness: sedated Pain management: satisfactory to patient Vital Signs Assessment: post-procedure vital signs reviewed and stable Respiratory status: spontaneous breathing Cardiovascular status: stable Anesthetic complications: no    Last Vitals:  Filed Vitals:   09/30/15 1230 09/30/15 1357  BP: 131/70 127/65  Pulse: 87 62  Temp: 36.4 C 36.6 C  Resp: 16 16    Last Pain:  Filed Vitals:   09/30/15 1458  PainSc: [place]

## 2015-10-03 NOTE — Op Note (Signed)
Preoperative diagnosis: Left paratesticular mass  Postoperative diagnosis: left paratesticular calculus, left hydrocele  Procedure: 1. Removal of paratesticular calculus 2. Excision of left appendix testis 3. Left hydrocelectomy  Attending: Nicolette Bang, MD  Anesthesia: General  History of blood loss: Minimal  Antibiotics: ancef  Drains: none  Specimens: 1. Left epididymal cyst 2. Left paratesticular calculus  Findings: 2.5cm paratesticular calculus. Small left hydrocele  Indications: Patient is a 75 year old male with a history of left paratesticular mass/calculus that was growing in size and causing him pain with walking.  We discussed the treatment options including observation versus excision after discussing treatment options he and decided to proceed with excision.   Procedure in detail: Prior to procedure consent was obtained.  Patient was brought to the operating room and a brief timeout was done to ensure correct patient, correct procedure, correct site.  General anesthesia was administered and patient was placed in supine position.  His genitalia was then prepped and draped in usual sterile fashion.  A 3 cm incision was made in the left inguinal region.  We dissected down to the spermatic cord and then delivered the testicle into the operative field. We then used sharp dissection to remove the paratesticular calculus. A small hydrocele was encountered. We then excised the hydrocele sac and then over sewed the edge with 2-0 Vicryl in a running fashion. We then excised the left appendix testis. Hemostasis was then obtained with electrocautery. We then returned the testis to the left hemiscrotum and closed the overlying subcutaneous tissues with 3-0 vicryl in a running fashion. The skin was then closed with 4-0 monocryl in a running fashion. Dermabond was placed on the incision.  A dressing was then applied to the incision.  We then placed a scrotal fluff and this then concluded  the procedure which was well tolerated by the patient.  Complications: None  Condition: Stable, extubated, transferred to PACU.  Plan: Patient is to be discharged home.  He is to follow up in 2 weeks for wound check.

## 2015-10-09 ENCOUNTER — Ambulatory Visit (INDEPENDENT_AMBULATORY_CARE_PROVIDER_SITE_OTHER): Payer: Self-pay | Admitting: Urology

## 2015-10-09 DIAGNOSIS — N509 Disorder of male genital organs, unspecified: Secondary | ICD-10-CM

## 2015-11-28 DIAGNOSIS — H31009 Unspecified chorioretinal scars, unspecified eye: Secondary | ICD-10-CM | POA: Diagnosis not present

## 2015-11-28 DIAGNOSIS — H59813 Chorioretinal scars after surgery for detachment, bilateral: Secondary | ICD-10-CM | POA: Diagnosis not present

## 2015-11-28 DIAGNOSIS — H538 Other visual disturbances: Secondary | ICD-10-CM | POA: Diagnosis not present

## 2015-11-28 DIAGNOSIS — Z8669 Personal history of other diseases of the nervous system and sense organs: Secondary | ICD-10-CM | POA: Diagnosis not present

## 2015-11-28 DIAGNOSIS — H35361 Drusen (degenerative) of macula, right eye: Secondary | ICD-10-CM | POA: Diagnosis not present

## 2015-12-25 DIAGNOSIS — S80861A Insect bite (nonvenomous), right lower leg, initial encounter: Secondary | ICD-10-CM | POA: Diagnosis not present

## 2015-12-26 ENCOUNTER — Other Ambulatory Visit: Payer: Self-pay | Admitting: *Deleted

## 2015-12-26 DIAGNOSIS — I6523 Occlusion and stenosis of bilateral carotid arteries: Secondary | ICD-10-CM

## 2016-01-03 ENCOUNTER — Encounter: Payer: Self-pay | Admitting: Family

## 2016-01-10 ENCOUNTER — Ambulatory Visit (INDEPENDENT_AMBULATORY_CARE_PROVIDER_SITE_OTHER): Payer: Medicare HMO | Admitting: Family

## 2016-01-10 ENCOUNTER — Ambulatory Visit (HOSPITAL_COMMUNITY)
Admission: RE | Admit: 2016-01-10 | Discharge: 2016-01-10 | Disposition: A | Discharge status: Home / Self Care | Payer: Medicare HMO | Source: Ambulatory Visit | Attending: Family | Admitting: Family

## 2016-01-10 ENCOUNTER — Encounter: Payer: Self-pay | Admitting: Family

## 2016-01-10 VITALS — BP 102/62 | HR 59 | Ht 70.0 in | Wt 167.0 lb

## 2016-01-10 DIAGNOSIS — E119 Type 2 diabetes mellitus without complications: Secondary | ICD-10-CM | POA: Diagnosis not present

## 2016-01-10 DIAGNOSIS — Z48812 Encounter for surgical aftercare following surgery on the circulatory system: Secondary | ICD-10-CM | POA: Diagnosis not present

## 2016-01-10 DIAGNOSIS — I6522 Occlusion and stenosis of left carotid artery: Secondary | ICD-10-CM | POA: Diagnosis not present

## 2016-01-10 DIAGNOSIS — I6523 Occlusion and stenosis of bilateral carotid arteries: Secondary | ICD-10-CM | POA: Diagnosis not present

## 2016-01-10 DIAGNOSIS — Z9889 Other specified postprocedural states: Secondary | ICD-10-CM

## 2016-01-10 DIAGNOSIS — I6521 Occlusion and stenosis of right carotid artery: Secondary | ICD-10-CM | POA: Diagnosis not present

## 2016-01-10 DIAGNOSIS — E785 Hyperlipidemia, unspecified: Secondary | ICD-10-CM | POA: Diagnosis not present

## 2016-01-10 LAB — VAS US CAROTID
LCCAPDIAS: 19 cm/s
LCCAPSYS: 118 cm/s
LEFT ECA DIAS: -11 cm/s
LEFT VERTEBRAL DIAS: 16 cm/s
Left CCA dist dias: 15 cm/s
Left CCA dist sys: 81 cm/s
Left ICA dist dias: -19 cm/s
Left ICA dist sys: -53 cm/s
Left ICA prox dias: -18 cm/s
Left ICA prox sys: -64 cm/s
RCCADSYS: -64 cm/s
RCCAPDIAS: 15 cm/s
RIGHT CCA MID DIAS: 17 cm/s
RIGHT ECA DIAS: -7 cm/s
Right CCA prox sys: 86 cm/s

## 2016-01-10 NOTE — Patient Instructions (Signed)
Stroke Prevention Some medical conditions and behaviors are associated with an increased chance of having a stroke. You may prevent a stroke by making healthy choices and managing medical conditions. HOW CAN I REDUCE MY RISK OF HAVING A STROKE?   Stay physically active. Get at least 30 minutes of activity on most or all days.  Do not smoke. It may also be helpful to avoid exposure to secondhand smoke.  Limit alcohol use. Moderate alcohol use is considered to be:  No more than 2 drinks per day for men.  No more than 1 drink per day for nonpregnant women.  Eat healthy foods. This involves:  Eating 5 or more servings of fruits and vegetables a day.  Making dietary changes that address high blood pressure (hypertension), high cholesterol, diabetes, or obesity.  Manage your cholesterol levels.  Making food choices that are high in fiber and low in saturated fat, trans fat, and cholesterol may control cholesterol levels.  Take any prescribed medicines to control cholesterol as directed by your health care provider.  Manage your diabetes.  Controlling your carbohydrate and sugar intake is recommended to manage diabetes.  Take any prescribed medicines to control diabetes as directed by your health care provider.  Control your hypertension.  Making food choices that are low in salt (sodium), saturated fat, trans fat, and cholesterol is recommended to manage hypertension.  Ask your health care provider if you need treatment to lower your blood pressure. Take any prescribed medicines to control hypertension as directed by your health care provider.  If you are 18-39 years of age, have your blood pressure checked every 3-5 years. If you are 40 years of age or older, have your blood pressure checked every year.  Maintain a healthy weight.  Reducing calorie intake and making food choices that are low in sodium, saturated fat, trans fat, and cholesterol are recommended to manage  weight.  Stop drug abuse.  Avoid taking birth control pills.  Talk to your health care provider about the risks of taking birth control pills if you are over 35 years old, smoke, get migraines, or have ever had a blood clot.  Get evaluated for sleep disorders (sleep apnea).  Talk to your health care provider about getting a sleep evaluation if you snore a lot or have excessive sleepiness.  Take medicines only as directed by your health care provider.  For some people, aspirin or blood thinners (anticoagulants) are helpful in reducing the risk of forming abnormal blood clots that can lead to stroke. If you have the irregular heart rhythm of atrial fibrillation, you should be on a blood thinner unless there is a good reason you cannot take them.  Understand all your medicine instructions.  Make sure that other conditions (such as anemia or atherosclerosis) are addressed. SEEK IMMEDIATE MEDICAL CARE IF:   You have sudden weakness or numbness of the face, arm, or leg, especially on one side of the body.  Your face or eyelid droops to one side.  You have sudden confusion.  You have trouble speaking (aphasia) or understanding.  You have sudden trouble seeing in one or both eyes.  You have sudden trouble walking.  You have dizziness.  You have a loss of balance or coordination.  You have a sudden, severe headache with no known cause.  You have new chest pain or an irregular heartbeat. Any of these symptoms may represent a serious problem that is an emergency. Do not wait to see if the symptoms will   go away. Get medical help at once. Call your local emergency services (911 in [place] Do not drive yourself to the hospital.   This information is not intended to replace advice given to you by your health care provider. Make sure you discuss any questions you have with your health care provider.   Document Released: 08/06/2004 Document Revised: 07/20/2014 Document Reviewed:  12/30/2012 Elsevier Interactive Patient Education 2016 [employer] Inc[employer]  

## 2016-01-10 NOTE — Progress Notes (Signed)
Chief Complaint: Follow up Extracranial Carotid Artery Stenosis   History of Present Illness  Adam [name] is a 75 y.o. male patient of Dr. Donnetta Hutching who is s/p right carotid endarterectomy in April 2012. He had preoperative transient right eye visual changes. He returns for routine follow up.  He had a left eye vitrectomy for detached retina  Pt Diabetic: no Pt smoker: non-smoker  Pt meds include: Statin : he stopped taking since he read about concerning side effects, states his PCP is aware ASA: yes Other anticoagulants/antiplatelets: no   Past Medical History  Diagnosis Date  . PONV (postoperative nausea and vomiting)   . Hyperlipemia   . Stroke ([place])     Right Eye / VISUAL PROBLEM RESOLVED AFTER ENDARTERECTOMY  . Paratesticular mass   . History of kidney stones   . Borderline diabetic     DIET CONTROLLED    Social History Social History  Substance Use Topics  . Smoking status: Never Smoker   . Smokeless tobacco: Never Used  . Alcohol Use: No    Family History Family History  Problem Relation Age of Onset  . Anesthesia problems Neg Hx   . Hypotension Neg Hx   . Malignant hyperthermia Neg Hx   . Pseudochol deficiency Neg Hx   . Hypertension Father   . Heart disease Father     After age 85    Surgical History Past Surgical History  Procedure Laterality Date  . Cystoscopy  1970    [place]  . [place]  . Irrigation and debridement sebaceous cyst  2001, 2007    [place], [place], in [facility] office  . Skin cancer excision  2010    pre cancerous tissue:nose, scalp, DR. Hall's office  . Carotid endarterectomy  10/03/10    [place]  . Cataract extraction w/phaco  01/26/2011    Procedure: CATARACT EXTRACTION PHACO AND INTRAOCULAR LENS PLACEMENT (IOC);  Surgeon: Williams Che;  Location: [facility];  Service: Ophthalmology;  Laterality: Right;  CDE:8.64  . Cataract extraction w/phaco Left 02/12/2014    Procedure: CATARACT EXTRACTION PHACO AND INTRAOCULAR  LENS PLACEMENT (IOC);  Surgeon: Williams Che, MD;  Location: [facility];  Service: Ophthalmology;  Laterality: Left;  CDE 6.25  . Yag laser application Right [id number]    Procedure: YAG LASER APPLICATION;  Surgeon: Williams Che, MD;  Location: [facility];  Service: Ophthalmology;  Laterality: Right;  . Eye surgery  06/30/2010    detached retina, gsbo  . Eye surgery Left Nov 26, 2014    Detached Retina  . Testicular exploration Left 09/30/2015    Procedure: INGUINAL PARATESTICULAR  MASS REMOVAL;  Surgeon: Cleon Gustin, MD;  Location: [facility];  Service: Urology;  Laterality: Left;  . Hydrocele excision Left 09/30/2015    Procedure: HYDROCELECTOMY ADULT;  Surgeon: Cleon Gustin, MD;  Location: [facility];  Service: Urology;  Laterality: Left;    No Known Allergies  Current Outpatient Prescriptions  Medication Sig Dispense Refill  . aspirin 325 MG EC tablet Take 162.5 mg by mouth daily.    Marland Kitchen HYDROcodone-acetaminophen (NORCO) 5-325 MG tablet Take 1 tablet by mouth every 6 (six) hours as needed for moderate pain. 30 tablet 0  . ibuprofen (ADVIL,MOTRIN) 200 MG tablet Take 200 mg by mouth every 8 (eight) hours as needed. For pain     . MAGNESIUM PO Take 250 mg by mouth once a week.     . niacin 500 MG  tablet Take 500 mg by mouth once a week.    . timolol (BETIMOL) 0.5 % ophthalmic solution Place 1 drop into the right eye daily.      No current facility-administered medications for this visit.    Review of Systems : See HPI for pertinent positives and negatives.  Physical Examination  Filed Vitals:   01/10/16 1326 01/10/16 1328  BP: 102/61 102/62  Pulse: 59   Height: 5\' 10"  (1.778 m)   Weight: 167 lb (75.751 kg)   SpO2: 99%    Body mass index is 23.96 kg/(m^2).  General: WDWN male in NAD GAIT: normal Eyes: PERRLA Pulmonary: Respirations are non-labored, CTAB, good air movement. Cardiac: regular rhythm,no detected murmur.  VASCULAR EXAM Carotid Bruits Right Left    Negative Negative   Aorta is not palpable. Radial pulses are 2+ palpable and equal.      LE Pulses Right Left   POPLITEAL not palpable  not palpable   POSTERIOR TIBIAL  palpable   palpable    DORSALIS PEDIS  ANTERIOR TIBIAL palpable  palpable     Gastrointestinal: soft, nontender, BS WNL, no r/g, no palpable masses.  Musculoskeletal: No muscle atrophy/wasting. M/S 5/5 throughout, extremities without ischemic changes.  Neurologic: A&O X 3; Appropriate Affect, Speech is normal CN 2-12 intact, pain and light touch intact in extremities, motor exam as listed above.          Non-Invasive Vascular Imaging CAROTID DUPLEX 01/10/2016   CEREBROVASCULAR DUPLEX EVALUATION    INDICATION: Follow-up carotid disease     PREVIOUS INTERVENTION(S): Right carotid endarterectomy 10/02/2010    DUPLEX EXAM:     RIGHT  LEFT  Peak Systolic Velocities (cm/s) End Diastolic Velocities (cm/s) Plaque LOCATION Peak Systolic Velocities (cm/s) End Diastolic Velocities (cm/s) Plaque  86 15  CCA PROXIMAL 118 19   118 17  CCA MID 114 19   75 15  CCA DISTAL 81 15   64 7  ECA 97 11   38 7  ICA PROXIMAL 64 18 HT  67 20  ICA MID 64 18   64 22  ICA DISTAL 53 19      ICA / CCA Ratio (PSV)   Antegrade  Vertebral Flow Antegrade    Brachial Systolic Pressure (mmHg)   Triphasic  SubclavianArtery Waveforms Triphasic    Plaque Morphology:  HM = Homogeneous, HT = Heterogeneous, CP = Calcific Plaque, SP = Smooth Plaque, IP = Irregular Plaque     ADDITIONAL FINDINGS:     IMPRESSION: 1. Widely patent right carotid endarterectomy without evidence of restenosis or hyperplasia.  2. 1-39% left internal carotid artery stenosis.    Compared to the previous exam:  No significant change compared to prior exam.      Assessment: Adam [name] is  a 75 y.o. male who is s/p right carotid endarterectomy in April 2012. He had a right eye TIA with transient amaurosis fugax just prior to the right CEA. He has had no subsequent TIA or stroke. Today's carotid Duplex suggests a widely patent right carotid endarterectomy without evidence of restenosis or hyperplasia, and minimal stenosis in the left internal carotid artery. No significant change compared to prior exam.    Fortunately he does not have DM, has never used tobacco, has a normal BMI, and stays physically active.    Plan: Follow-up in 18 months with Carotid Duplex scan.   I discussed in depth with the patient the nature of atherosclerosis, and emphasized the importance of maximal medical  management including strict control of blood pressure, blood glucose, and lipid levels, obtaining regular exercise, and continued cessation of smoking.  The patient is aware that without maximal medical management the underlying atherosclerotic disease process will progress, limiting the benefit of any interventions. The patient was given information about stroke prevention and what symptoms should prompt the patient to seek immediate medical care. Thank you for allowing [place] to participate in this patient's care.  Clemon Chambers, RN, MSN, FNP-C [facility] Office: [phone]  Clinic Physician: Bridgett Larsson  01/10/2016 1:29 PM

## 2016-04-06 DIAGNOSIS — E782 Mixed hyperlipidemia: Secondary | ICD-10-CM | POA: Diagnosis not present

## 2016-04-06 DIAGNOSIS — E119 Type 2 diabetes mellitus without complications: Secondary | ICD-10-CM | POA: Diagnosis not present

## 2016-04-08 DIAGNOSIS — Z0000 Encounter for general adult medical examination without abnormal findings: Secondary | ICD-10-CM | POA: Diagnosis not present

## 2016-04-08 DIAGNOSIS — E782 Mixed hyperlipidemia: Secondary | ICD-10-CM | POA: Diagnosis not present

## 2016-04-08 DIAGNOSIS — E119 Type 2 diabetes mellitus without complications: Secondary | ICD-10-CM | POA: Diagnosis not present

## 2016-09-17 DIAGNOSIS — H31009 Unspecified chorioretinal scars, unspecified eye: Secondary | ICD-10-CM | POA: Diagnosis not present

## 2016-09-17 DIAGNOSIS — H35361 Drusen (degenerative) of macula, right eye: Secondary | ICD-10-CM | POA: Diagnosis not present

## 2016-09-17 DIAGNOSIS — Z8669 Personal history of other diseases of the nervous system and sense organs: Secondary | ICD-10-CM | POA: Diagnosis not present

## 2016-09-17 DIAGNOSIS — H59813 Chorioretinal scars after surgery for detachment, bilateral: Secondary | ICD-10-CM | POA: Diagnosis not present

## 2016-10-05 DIAGNOSIS — E782 Mixed hyperlipidemia: Secondary | ICD-10-CM | POA: Diagnosis not present

## 2016-10-05 DIAGNOSIS — E119 Type 2 diabetes mellitus without complications: Secondary | ICD-10-CM | POA: Diagnosis not present

## 2016-10-07 DIAGNOSIS — E119 Type 2 diabetes mellitus without complications: Secondary | ICD-10-CM | POA: Diagnosis not present

## 2016-10-07 DIAGNOSIS — E782 Mixed hyperlipidemia: Secondary | ICD-10-CM | POA: Diagnosis not present

## 2016-10-27 DIAGNOSIS — R69 Illness, unspecified: Secondary | ICD-10-CM | POA: Diagnosis not present

## 2016-10-28 DIAGNOSIS — R69 Illness, unspecified: Secondary | ICD-10-CM | POA: Diagnosis not present

## 2016-12-17 DIAGNOSIS — R69 Illness, unspecified: Secondary | ICD-10-CM | POA: Diagnosis not present

## 2017-04-08 DIAGNOSIS — E119 Type 2 diabetes mellitus without complications: Secondary | ICD-10-CM | POA: Diagnosis not present

## 2017-04-08 DIAGNOSIS — I10 Essential (primary) hypertension: Secondary | ICD-10-CM | POA: Diagnosis not present

## 2017-04-12 DIAGNOSIS — E782 Mixed hyperlipidemia: Secondary | ICD-10-CM | POA: Diagnosis not present

## 2017-04-12 DIAGNOSIS — R7301 Impaired fasting glucose: Secondary | ICD-10-CM | POA: Diagnosis not present

## 2017-04-12 DIAGNOSIS — Z0000 Encounter for general adult medical examination without abnormal findings: Secondary | ICD-10-CM | POA: Diagnosis not present

## 2017-05-07 DIAGNOSIS — R69 Illness, unspecified: Secondary | ICD-10-CM | POA: Diagnosis not present

## 2017-07-19 ENCOUNTER — Other Ambulatory Visit: Payer: Self-pay

## 2017-07-19 DIAGNOSIS — I6529 Occlusion and stenosis of unspecified carotid artery: Secondary | ICD-10-CM

## 2017-07-20 ENCOUNTER — Ambulatory Visit (HOSPITAL_COMMUNITY)
Admission: RE | Admit: 2017-07-20 | Discharge: 2017-07-20 | Disposition: A | Discharge status: Home / Self Care | Payer: Medicare HMO | Source: Ambulatory Visit | Attending: Vascular Surgery | Admitting: Vascular Surgery

## 2017-07-20 ENCOUNTER — Encounter: Payer: Self-pay | Admitting: Family

## 2017-07-20 ENCOUNTER — Ambulatory Visit: Payer: Medicare HMO | Admitting: Family

## 2017-07-20 VITALS — BP 124/67 | HR 58 | Temp 97.5°F | Resp 20 | Ht 70.0 in | Wt 162.8 lb

## 2017-07-20 DIAGNOSIS — I6521 Occlusion and stenosis of right carotid artery: Secondary | ICD-10-CM | POA: Diagnosis not present

## 2017-07-20 DIAGNOSIS — I6529 Occlusion and stenosis of unspecified carotid artery: Secondary | ICD-10-CM | POA: Diagnosis not present

## 2017-07-20 DIAGNOSIS — Z9889 Other specified postprocedural states: Secondary | ICD-10-CM | POA: Diagnosis not present

## 2017-07-20 LAB — VAS US CAROTID
LCCADSYS: 65 cm/s
LCCAPDIAS: 16 cm/s
LCCAPSYS: 103 cm/s
LEFT ECA DIAS: -13 cm/s
LEFT VERTEBRAL DIAS: 15 cm/s
LICADSYS: -64 cm/s
LICAPSYS: -58 cm/s
Left CCA dist dias: 15 cm/s
Left ICA dist dias: -21 cm/s
Left ICA prox dias: -17 cm/s
RCCAPSYS: 80 cm/s
RIGHT CCA MID DIAS: 18 cm/s
RIGHT ECA DIAS: -6 cm/s
RIGHT VERTEBRAL DIAS: 6 cm/s
Right CCA prox dias: 13 cm/s
Right cca dist sys: -54 cm/s

## 2017-07-20 NOTE — Patient Instructions (Signed)

## 2017-07-20 NOTE — Progress Notes (Signed)
Chief Complaint: Follow up Extracranial Carotid Artery Stenosis   History of Present Illness  Adam [name] is a 77 y.o. male who is s/p right carotid endarterectomy in April 2012 by Dr. Donnetta Hutching. He had preoperative transient right eye visual changes. He returns for routine follow up.  He had a left eye vitrectomy for detached retina.  He denies claudication sx's with walking. He states he walks at least 30 minutes daily.   Pt Diabetic: no Pt smoker: non-smoker  Pt meds include: Statin : he stopped taking since he read about concerning side effects, states his PCP is aware ASA: yes Other anticoagulants/antiplatelets: no   Past Medical History:  Diagnosis Date  . Borderline diabetic    DIET CONTROLLED  . History of kidney stones   . Hyperlipemia   . Paratesticular mass   . PONV (postoperative nausea and vomiting)   . Stroke [facility])    Right Eye / VISUAL PROBLEM RESOLVED AFTER ENDARTERECTOMY    Social History Social History   Tobacco Use  . Smoking status: Never Smoker  . Smokeless tobacco: Never Used  Substance Use Topics  . Alcohol use: No  . Drug use: No    Family History Family History  Problem Relation Age of Onset  . Hypertension Father   . Heart disease Father        After age 70  . Anesthesia problems Neg Hx   . Hypotension Neg Hx   . Malignant hyperthermia Neg Hx   . Pseudochol deficiency Neg Hx     Surgical History Past Surgical History:  Procedure Laterality Date  . [place]ENDECT[place]  07[place]  . CAR[place]ID [place]ARTERECTOMY  10/03/10   [place]  . CATARACT EXTRACTION W/PHACO  01/26/2011   Procedure: CATARACT EXTRACTION PHACO AND INTRAOCULAR LENS PLACEMENT (IOC);  Surgeon: Williams Che;  Location: [facility];  Service: Ophthalmology;  Laterality: Right;  CDE:8.64  . CATARACT EXTRACTION W/PHACO Left 02/12/2014   Procedure: CATARACT EXTRACTION PHACO AND INTRAOCULAR LENS PLACEMENT (IOC);  Surgeon: Williams Che, MD;  Location: [facility];   Service: Ophthalmology;  Laterality: Left;  CDE 6.25  . [place] SURGERY  06/30/2010   detached retina, gsbo  . EYE SURGERY Left Nov 26, 2014   Detached Retina  . HYDROCELE EXCISION Left 09/30/2015   Procedure: HYDROCELECTOMY ADULT;  Surgeon: Cleon Gustin, MD;  [facility];  Service: Urology;  Laterality: Left;  . IRRIGATION AND DEBRIDEMENT SEBACEOUS CYST  2001, 2007   [place], [place], in [place] office  . SKIN CANCER EXCISION  2010   pre cancerous tissue:nose, scalp, DR. Hall's office  . TESTICULAR EXPLORATION Left 09/30/2015   Procedure: INGUINAL PARATESTICULAR  MASS REMOVAL;  Surgeon: Cleon Gustin, MD;  [facility];  Service: Urology;  Laterality: Left;  . YAG LASER APPLICATION Right 08/17/3662   Procedure: YAG LASER APPLICATION;  Surgeon: Williams Che, MD;  Location: [facility];  Service: Ophthalmology;  Laterality: Right;    No Known Allergies  Current Outpatient Medications  Medication Sig Dispense Refill  . aspirin 325 MG EC tablet Take 162.5 mg by mouth daily.    Marland Kitchen ibuprofen (ADVIL,MOTRIN) 200 MG tablet Take 200 mg by mouth every 8 (eight) hours as needed. For pain     . MAGNESIUM PO Take 250 mg by mouth once a week.     . niacin 500 MG tablet Take 500 mg by mouth once a week.    Marland Kitchen  timolol (BETIMOL) 0.5 % ophthalmic solution Place 1 drop into the right eye daily.     Marland Kitchen HYDROcodone-acetaminophen (NORCO) 5-325 MG tablet Take 1 tablet by mouth every 6 (six) hours as needed for moderate pain. (Patient not taking: Reported on 01/10/2016) 30 tablet 0   No current facility-administered medications for this visit.     Review of Systems : See HPI for pertinent positives and negatives.  Physical Examination  Vitals:   07/20/17 1005 07/20/17 1006  BP: 120/67 124/67  Pulse: (!) 58   Resp: 20   Temp: (!) 97.5 F (36.4 C)   TempSrc: Oral   SpO2: 98%   Weight: 162 lb 12.8 oz (73.8 kg)   Height: 5\' 10"  (1.778 m)    Body mass index is 23.36  kg/m.  General: WDWN male in NAD GAIT: normal Eyes: PERRLA Pulmonary: Respirations are non-labored, CTAB, good air movement in all fields.  Cardiac: regular rhythm,no detected murmur.  VASCULAR EXAM Carotid Bruits Right Left   Negative Negative    Abdominal aortic pulse is not palpable. Radial pulses are 2+ palpable and equal.      LE Pulses Right Left   POPLITEAL not palpable  not palpable   POSTERIOR TIBIAL  palpable   palpable    DORSALIS PEDIS  ANTERIOR TIBIAL palpable  palpable     Gastrointestinal: soft, nontender, BS WNL, no r/g, no palpable masses.  Musculoskeletal: No muscle atrophy/wasting. M/S 5/5 throughout, extremities without ischemic changes.  Skin: No rash, no cellulitis, no ulcers noted.   Neurologic: A&O X 3; appropriate affect, speech is normal, CN 2-12 intact, pain and light touch intact in extremities, motor exam as listed above    Assessment: Adam [name] is a 77 y.o. male who is s/p right carotid endarterectomy in April 2012. He had a right eye TIA with transient amaurosis fugax just prior to the right CEA. He has had no subsequent TIA or stroke.  Fortunately he does not have DM, has never used tobacco, has a normal BMI, and stays physically active.   DATA  Carotid Duplex (07/20/17): Widely patent right carotid endarterectomy without evidence of restenosis or hyperplasia, and 1-39% stenosis in the left internal carotid artery. Bilateral vertebral artery flow is antegrade.  Bilateral subclavian artery waveforms are normal.  No significant change compared to the exam on 01-10-16.     Plan: Follow-up in 18 months with Carotid Duplex scan.     I discussed in depth with the patient the nature of atherosclerosis, and emphasized the importance of maximal medical  management including strict control of blood pressure, blood glucose, and lipid levels, obtaining regular exercise, and continued cessation of smoking.  The patient is aware that without maximal medical management the underlying atherosclerotic disease process will progress, limiting the benefit of any interventions. The patient was given information about stroke prevention and what symptoms should prompt the patient to seek immediate medical care. Thank you for allowing [place] to participate in this patient's care.  Clemon Chambers, RN, MSN, FNP-C [facility] Office: [phone]  Clinic Physician: Early  07/20/17 10:09 AM

## 2017-07-21 DIAGNOSIS — R69 Illness, unspecified: Secondary | ICD-10-CM | POA: Diagnosis not present

## 2017-09-23 DIAGNOSIS — H59813 Chorioretinal scars after surgery for detachment, bilateral: Secondary | ICD-10-CM | POA: Diagnosis not present

## 2017-09-23 DIAGNOSIS — H31009 Unspecified chorioretinal scars, unspecified eye: Secondary | ICD-10-CM | POA: Diagnosis not present

## 2017-09-23 DIAGNOSIS — H401111 Primary open-angle glaucoma, right eye, mild stage: Secondary | ICD-10-CM | POA: Diagnosis not present

## 2017-09-23 DIAGNOSIS — H35361 Drusen (degenerative) of macula, right eye: Secondary | ICD-10-CM | POA: Diagnosis not present

## 2017-10-05 DIAGNOSIS — R69 Illness, unspecified: Secondary | ICD-10-CM | POA: Diagnosis not present

## 2017-10-13 DIAGNOSIS — E782 Mixed hyperlipidemia: Secondary | ICD-10-CM | POA: Diagnosis not present

## 2017-10-13 DIAGNOSIS — E119 Type 2 diabetes mellitus without complications: Secondary | ICD-10-CM | POA: Diagnosis not present

## 2017-10-15 DIAGNOSIS — Z6823 Body mass index (BMI) 23.0-23.9, adult: Secondary | ICD-10-CM | POA: Diagnosis not present

## 2017-10-15 DIAGNOSIS — R7301 Impaired fasting glucose: Secondary | ICD-10-CM | POA: Diagnosis not present

## 2017-10-15 DIAGNOSIS — E782 Mixed hyperlipidemia: Secondary | ICD-10-CM | POA: Diagnosis not present

## 2017-12-26 DIAGNOSIS — R69 Illness, unspecified: Secondary | ICD-10-CM | POA: Diagnosis not present

## 2018-01-26 DIAGNOSIS — H31009 Unspecified chorioretinal scars, unspecified eye: Secondary | ICD-10-CM | POA: Diagnosis not present

## 2018-01-26 DIAGNOSIS — Z8669 Personal history of other diseases of the nervous system and sense organs: Secondary | ICD-10-CM | POA: Diagnosis not present

## 2018-01-26 DIAGNOSIS — H401111 Primary open-angle glaucoma, right eye, mild stage: Secondary | ICD-10-CM | POA: Diagnosis not present

## 2018-01-26 DIAGNOSIS — H59813 Chorioretinal scars after surgery for detachment, bilateral: Secondary | ICD-10-CM | POA: Diagnosis not present

## 2018-02-17 DIAGNOSIS — R69 Illness, unspecified: Secondary | ICD-10-CM | POA: Diagnosis not present

## 2018-02-22 DIAGNOSIS — H26492 Other secondary cataract, left eye: Secondary | ICD-10-CM | POA: Diagnosis not present

## 2018-04-13 DIAGNOSIS — R69 Illness, unspecified: Secondary | ICD-10-CM | POA: Diagnosis not present

## 2018-04-14 DIAGNOSIS — Z6823 Body mass index (BMI) 23.0-23.9, adult: Secondary | ICD-10-CM | POA: Diagnosis not present

## 2018-04-14 DIAGNOSIS — E782 Mixed hyperlipidemia: Secondary | ICD-10-CM | POA: Diagnosis not present

## 2018-04-14 DIAGNOSIS — Z0000 Encounter for general adult medical examination without abnormal findings: Secondary | ICD-10-CM | POA: Diagnosis not present

## 2018-04-14 DIAGNOSIS — R7301 Impaired fasting glucose: Secondary | ICD-10-CM | POA: Diagnosis not present

## 2018-04-18 DIAGNOSIS — Z6823 Body mass index (BMI) 23.0-23.9, adult: Secondary | ICD-10-CM | POA: Diagnosis not present

## 2018-04-18 DIAGNOSIS — E875 Hyperkalemia: Secondary | ICD-10-CM | POA: Diagnosis not present

## 2018-04-18 DIAGNOSIS — E782 Mixed hyperlipidemia: Secondary | ICD-10-CM | POA: Diagnosis not present

## 2018-04-18 DIAGNOSIS — R7301 Impaired fasting glucose: Secondary | ICD-10-CM | POA: Diagnosis not present

## 2018-04-22 DIAGNOSIS — R69 Illness, unspecified: Secondary | ICD-10-CM | POA: Diagnosis not present

## 2018-06-07 DIAGNOSIS — R69 Illness, unspecified: Secondary | ICD-10-CM | POA: Diagnosis not present

## 2018-06-21 DIAGNOSIS — Z1212 Encounter for screening for malignant neoplasm of rectum: Secondary | ICD-10-CM | POA: Diagnosis not present

## 2018-06-21 DIAGNOSIS — Z1211 Encounter for screening for malignant neoplasm of colon: Secondary | ICD-10-CM | POA: Diagnosis not present

## 2018-06-22 DIAGNOSIS — Z8669 Personal history of other diseases of the nervous system and sense organs: Secondary | ICD-10-CM | POA: Diagnosis not present

## 2018-06-22 DIAGNOSIS — H31009 Unspecified chorioretinal scars, unspecified eye: Secondary | ICD-10-CM | POA: Diagnosis not present

## 2018-06-22 DIAGNOSIS — H401111 Primary open-angle glaucoma, right eye, mild stage: Secondary | ICD-10-CM | POA: Diagnosis not present

## 2018-06-22 DIAGNOSIS — H59813 Chorioretinal scars after surgery for detachment, bilateral: Secondary | ICD-10-CM | POA: Diagnosis not present

## 2018-07-27 DIAGNOSIS — R69 Illness, unspecified: Secondary | ICD-10-CM | POA: Diagnosis not present

## 2018-09-15 DIAGNOSIS — R69 Illness, unspecified: Secondary | ICD-10-CM | POA: Diagnosis not present

## 2018-11-16 DIAGNOSIS — R69 Illness, unspecified: Secondary | ICD-10-CM | POA: Diagnosis not present

## 2018-11-28 DIAGNOSIS — Z0000 Encounter for general adult medical examination without abnormal findings: Secondary | ICD-10-CM | POA: Diagnosis not present

## 2018-12-21 DIAGNOSIS — H401111 Primary open-angle glaucoma, right eye, mild stage: Secondary | ICD-10-CM | POA: Diagnosis not present

## 2018-12-21 DIAGNOSIS — H31009 Unspecified chorioretinal scars, unspecified eye: Secondary | ICD-10-CM | POA: Diagnosis not present

## 2018-12-21 DIAGNOSIS — H35361 Drusen (degenerative) of macula, right eye: Secondary | ICD-10-CM | POA: Diagnosis not present

## 2018-12-21 DIAGNOSIS — H59813 Chorioretinal scars after surgery for detachment, bilateral: Secondary | ICD-10-CM | POA: Diagnosis not present

## 2018-12-21 DIAGNOSIS — Z8669 Personal history of other diseases of the nervous system and sense organs: Secondary | ICD-10-CM | POA: Diagnosis not present

## 2019-01-04 DIAGNOSIS — R69 Illness, unspecified: Secondary | ICD-10-CM | POA: Diagnosis not present

## 2019-02-21 DIAGNOSIS — R69 Illness, unspecified: Secondary | ICD-10-CM | POA: Diagnosis not present

## 2019-03-23 DIAGNOSIS — E782 Mixed hyperlipidemia: Secondary | ICD-10-CM | POA: Diagnosis not present

## 2019-03-23 DIAGNOSIS — R7301 Impaired fasting glucose: Secondary | ICD-10-CM | POA: Diagnosis not present

## 2019-03-23 DIAGNOSIS — Z125 Encounter for screening for malignant neoplasm of prostate: Secondary | ICD-10-CM | POA: Diagnosis not present

## 2019-03-31 DIAGNOSIS — E782 Mixed hyperlipidemia: Secondary | ICD-10-CM | POA: Diagnosis not present

## 2019-03-31 DIAGNOSIS — R7301 Impaired fasting glucose: Secondary | ICD-10-CM | POA: Diagnosis not present

## 2019-03-31 DIAGNOSIS — R7303 Prediabetes: Secondary | ICD-10-CM | POA: Diagnosis not present

## 2019-03-31 DIAGNOSIS — Z6823 Body mass index (BMI) 23.0-23.9, adult: Secondary | ICD-10-CM | POA: Diagnosis not present

## 2019-03-31 DIAGNOSIS — D649 Anemia, unspecified: Secondary | ICD-10-CM | POA: Diagnosis not present

## 2019-03-31 DIAGNOSIS — E875 Hyperkalemia: Secondary | ICD-10-CM | POA: Diagnosis not present

## 2019-04-14 DIAGNOSIS — R69 Illness, unspecified: Secondary | ICD-10-CM | POA: Diagnosis not present

## 2019-05-02 DIAGNOSIS — R69 Illness, unspecified: Secondary | ICD-10-CM | POA: Diagnosis not present

## 2019-06-12 DIAGNOSIS — R69 Illness, unspecified: Secondary | ICD-10-CM | POA: Diagnosis not present

## 2019-08-04 DIAGNOSIS — R69 Illness, unspecified: Secondary | ICD-10-CM | POA: Diagnosis not present

## 2019-09-25 DIAGNOSIS — R69 Illness, unspecified: Secondary | ICD-10-CM | POA: Diagnosis not present

## 2019-09-28 DIAGNOSIS — Z0000 Encounter for general adult medical examination without abnormal findings: Secondary | ICD-10-CM | POA: Diagnosis not present

## 2019-09-28 DIAGNOSIS — R7301 Impaired fasting glucose: Secondary | ICD-10-CM | POA: Diagnosis not present

## 2019-09-28 DIAGNOSIS — Z6823 Body mass index (BMI) 23.0-23.9, adult: Secondary | ICD-10-CM | POA: Diagnosis not present

## 2019-09-28 DIAGNOSIS — R7303 Prediabetes: Secondary | ICD-10-CM | POA: Diagnosis not present

## 2019-09-28 DIAGNOSIS — E875 Hyperkalemia: Secondary | ICD-10-CM | POA: Diagnosis not present

## 2019-09-28 DIAGNOSIS — D649 Anemia, unspecified: Secondary | ICD-10-CM | POA: Diagnosis not present

## 2019-09-28 DIAGNOSIS — E782 Mixed hyperlipidemia: Secondary | ICD-10-CM | POA: Diagnosis not present

## 2019-10-04 DIAGNOSIS — Z6823 Body mass index (BMI) 23.0-23.9, adult: Secondary | ICD-10-CM | POA: Diagnosis not present

## 2019-10-04 DIAGNOSIS — D649 Anemia, unspecified: Secondary | ICD-10-CM | POA: Diagnosis not present

## 2019-10-04 DIAGNOSIS — Z0000 Encounter for general adult medical examination without abnormal findings: Secondary | ICD-10-CM | POA: Diagnosis not present

## 2019-10-04 DIAGNOSIS — E782 Mixed hyperlipidemia: Secondary | ICD-10-CM | POA: Diagnosis not present

## 2019-10-04 DIAGNOSIS — E875 Hyperkalemia: Secondary | ICD-10-CM | POA: Diagnosis not present

## 2019-10-04 DIAGNOSIS — R7303 Prediabetes: Secondary | ICD-10-CM | POA: Diagnosis not present

## 2019-10-19 DIAGNOSIS — Z23 Encounter for immunization: Secondary | ICD-10-CM | POA: Diagnosis not present

## 2019-11-21 DIAGNOSIS — R69 Illness, unspecified: Secondary | ICD-10-CM | POA: Diagnosis not present

## 2019-12-02 DIAGNOSIS — E119 Type 2 diabetes mellitus without complications: Secondary | ICD-10-CM | POA: Diagnosis not present

## 2019-12-02 DIAGNOSIS — R030 Elevated blood-pressure reading, without diagnosis of hypertension: Secondary | ICD-10-CM | POA: Diagnosis not present

## 2019-12-02 DIAGNOSIS — Z8249 Family history of ischemic heart disease and other diseases of the circulatory system: Secondary | ICD-10-CM | POA: Diagnosis not present

## 2019-12-02 DIAGNOSIS — Z8673 Personal history of transient ischemic attack (TIA), and cerebral infarction without residual deficits: Secondary | ICD-10-CM | POA: Diagnosis not present

## 2019-12-02 DIAGNOSIS — H409 Unspecified glaucoma: Secondary | ICD-10-CM | POA: Diagnosis not present

## 2019-12-27 ENCOUNTER — Encounter (INDEPENDENT_AMBULATORY_CARE_PROVIDER_SITE_OTHER): Payer: Self-pay | Admitting: Ophthalmology

## 2019-12-27 ENCOUNTER — Ambulatory Visit (INDEPENDENT_AMBULATORY_CARE_PROVIDER_SITE_OTHER): Payer: Medicare HMO | Admitting: Ophthalmology

## 2019-12-27 ENCOUNTER — Other Ambulatory Visit: Payer: Self-pay

## 2019-12-27 DIAGNOSIS — H59813 Chorioretinal scars after surgery for detachment, bilateral: Secondary | ICD-10-CM

## 2019-12-27 DIAGNOSIS — Z8669 Personal history of other diseases of the nervous system and sense organs: Secondary | ICD-10-CM

## 2019-12-27 DIAGNOSIS — H401113 Primary open-angle glaucoma, right eye, severe stage: Secondary | ICD-10-CM | POA: Diagnosis not present

## 2019-12-27 DIAGNOSIS — Z9889 Other specified postprocedural states: Secondary | ICD-10-CM | POA: Diagnosis not present

## 2019-12-27 DIAGNOSIS — H43811 Vitreous degeneration, right eye: Secondary | ICD-10-CM | POA: Diagnosis not present

## 2019-12-27 DIAGNOSIS — H401111 Primary open-angle glaucoma, right eye, mild stage: Secondary | ICD-10-CM | POA: Diagnosis not present

## 2019-12-27 NOTE — Progress Notes (Signed)
12/27/2019     CHIEF COMPLAINT Patient presents for Retina Follow Up   HISTORY OF PRESENT ILLNESS: Adam [name] is a 79 y.o. male who presents to the clinic today for:   HPI    Retina Follow Up    Patient presents with  Other.  In both eyes.  This started 1 year ago.  Severity is mild.  Duration of 1 year.  Since onset it is stable.          Comments    1 Year F/U OU  Pt denies noticeable changes to [place] OU since last visit. Pt denies ocular pain, flashes of light, or floaters OU.         Last edited by Rockie Neighbours, [place] on 12/27/2019  9:05 AM. (History)      Referring physician: Celene Squibb, MD [place],  [place] [address]  HISTORICAL INFORMATION:   Selected notes from the MEDICAL RECORD NUMBER       CURRENT MEDICATIONS: Current Outpatient Medications (Ophthalmic Drugs)  Medication Sig  . brimonidine (ALPHAGAN) 0.2 % ophthalmic solution Place 1 drop into the right eye 2 (two) times daily.  . timolol (BETIMOL) 0.5 % ophthalmic solution Place 1 drop into the right eye daily.    No current facility-administered medications for this visit. (Ophthalmic Drugs)   Current Outpatient Medications (Other)  Medication Sig  . aspirin 325 MG EC tablet Take 162.5 mg by mouth daily.  Marland Kitchen HYDROcodone-acetaminophen (NORCO) 5-325 MG tablet Take 1 tablet by mouth every 6 (six) hours as needed for moderate pain. (Patient not taking: Reported on 01/10/2016)  . ibuprofen (ADVIL,MOTRIN) 200 MG tablet Take 200 mg by mouth every 8 (eight) hours as needed. For pain   . MAGNESIUM PO Take 250 mg by mouth once a week.   . niacin 500 MG tablet Take 500 mg by mouth once a week.   No current facility-administered medications for this visit. (Other)      REVIEW OF SYSTEMS:    ALLERGIES No Known Allergies  PAST MEDICAL HISTORY Past Medical History:  Diagnosis Date  . Borderline diabetic    DIET CONTROLLED  . History of kidney stones   . Hyperlipemia   .  Paratesticular mass   . PONV (postoperative nausea and vomiting)   . Stroke [facility])    Right Eye / VISUAL PROBLEM RESOLVED AFTER ENDARTERECTOMY   Past Surgical History:  Procedure Laterality Date  . [place]ENDECTOMY  07/1972   [place]  . CAROTID ENDARTERECTOMY  10/03/10   [place]  . CATARACT EXTRACTION W/PHACO  01/26/2011   Procedure: CATARACT EXTRACTION PHACO AND INTRAOCULAR LENS PLACEMENT (IOC);  Surgeon: Williams Che;  Location: [facility];  Service: Ophthalmology;  Laterality: Right;  CDE:8.64  . CATARACT EXTRACTION W/PHACO Left 02/12/2014   Procedure: CATARACT EXTRACTION PHACO AND INTRAOCULAR LENS PLACEMENT (IOC);  Surgeon: Williams Che, MD;  Location: [facility];  Service: Ophthalmology;  Laterality: Left;  CDE 6.25  . [place] SURGERY  06/30/2010   detached retina, gsbo  . EYE SURGERY Left Nov 26, 2014   Detached Retina  . HYDROCELE EXCISION Left 09/30/2015   Procedure: HYDROCELECTOMY ADULT;  Surgeon: Cleon Gustin, MD;  Location: [facility];  Service: Urology;  Laterality: Left;  . IRRIGATION AND DEBRIDEMENT SEBACEOUS CYST  2001, 2007   [place], [place], in [place] office  . SKIN CANCER EXCISION  2010   pre cancerous tissue:nose, scalp, DR.  Hall's office  . TESTICULAR EXPLORATION Left 09/30/2015   Procedure: INGUINAL PARATESTICULAR  MASS REMOVAL;  Surgeon: Cleon Gustin, MD;  [facility]: [facility];  Service: Urology;  Laterality: Left;  . YAG LASER APPLICATION Right 08/14/252   Procedure: YAG LASER APPLICATION;  Surgeon: Williams Che, MD;  Location: [facility];  Service: Ophthalmology;  Laterality: Right;    FAMILY HISTORY Family History  Problem Relation Age of Onset  . Hypertension Father   . Heart disease Father        After age 74  . Anesthesia problems Neg Hx   . Hypotension Neg Hx   . Malignant hyperthermia Neg Hx   . Pseudochol deficiency Neg Hx     SOCIAL HISTORY Social History   Tobacco Use  . Smoking status: Never Smoker  . Smokeless tobacco: Never Used    Substance Use Topics  . Alcohol use: No  . Drug use: No         OPHTHALMIC EXAM:  Base Eye Exam    Visual Acuity (ETDRS)      Right Left   Dist [place] 20/200near eye 20/20distance eye -1   Dist ph  20/40 +2   Pt forgot glasses       Tonometry (Tonopen, 9:09 AM)      Right Left   Pressure 14 15       Pupils      Pupils Dark Light Shape React APD   Right PERRL 4 3 Round Brisk None   Left PERRL 4 3 Round Brisk None       Visual Fields (Counting fingers)      Left Right    Full Full       Extraocular Movement      Right Left    Full Full       Neuro/Psych    Oriented x3: Yes   Mood/Affect: Normal       Dilation    Both eyes: 1.0% Mydriacyl, 2.5% Phenylephrine @ 9:10 AM        Slit Lamp and Fundus Exam    External Exam      Right Left   External Normal Normal       Slit Lamp Exam      Right Left   Lids/Lashes Normal    Conjunctiva/Sclera White and quiet    Cornea Clear    Anterior Chamber Deep and quiet    Iris Round and reactive    Lens Posterior chamber intraocular lens Posterior chamber intraocular lens       Fundus Exam      Right Left   Posterior Vitreous Clear vitrectomized Clear vitrectomized   Disc 3+ Optic disc atrophy, 3+ Pallor 3+ Optic disc atrophy, 3+ Pallor   C/D Ratio 0.85 0.75   Macula Normal Normal   Vessels Normal Normal   Periphery Good scleral buckle OD, retina attached.  Vitreous clear.   Good scleral buckle OD, retina attached.  Vitreous clear.            IMAGING AND PROCEDURES  Imaging and Procedures for 12/27/19  Color Fundus Photography Optos - OU - Both Eyes       Right Eye Progression has worsened. Disc findings include increased cup to disc ratio, pallor. Macula : normal observations. Vessels : normal observations.   Left Eye Progression has been stable. Disc findings include increased cup to disc ratio. Macula : normal observations. Vessels : normal observations.   Notes OD, color photo documentation  of  increased cup-to-disc ratio, some disc pallor as well.  Uncontrolled intraocular pressure this suggest low-tension glaucoma and and its possible attendant proximal vascular deficits                ASSESSMENT/PLAN:  Primary open angle glaucoma of right eye, mild stage Optic nerve cupping and some pallor to the nerve appears to have progressed by examination and also by color.photograph documentation.  This could be low-tension glaucoma or just a vascular event in any case patient is currently on two medications      ICD-10-CM   1. 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 Primary open angle glaucoma of right eye, mild stage  H40.1111   3. Posterior vitreous detachment of right eye  H43.811   4. History of retinal detachment  Z86.69   5. History of vitrectomy  Z98.890   6. Primary open angle glaucoma of right eye, severe stage  H40.1113     1.  2.  3.  Ophthalmic Meds Ordered this visit:  No orders of the defined types were placed in this encounter.      Return in about 1 year (around 12/26/2020) for COLOR FP, DILATE OU.  There are no Patient Instructions on file for this visit.   Explained the diagnoses, plan, and follow up with the patient and they expressed understanding.  Patient expressed understanding of the importance of proper follow up care.   Clent Demark Chung Chagoya M.D. Diseases & Surgery of the Retina and Vitreous Retina & Diabetic [place] 12/27/19     Abbreviations: M myopia (nearsighted); A astigmatism; H hyperopia (farsighted); P presbyopia; Mrx spectacle prescription;  CTL contact lenses; OD right eye; OS left eye; OU both eyes  XT exotropia; ET esotropia; PEK punctate epithelial keratitis; PEE punctate epithelial erosions; DES dry eye syndrome; MGD meibomian gland dysfunction; ATs artificial tears; PFAT's preservative free artificial tears; [place] nuclear sclerotic cataract;  PSC posterior subcapsular cataract; ERM epi-retinal membrane; PVD posterior vitreous detachment; RD retinal detachment; DM diabetes mellitus; DR diabetic retinopathy; NPDR non-proliferative diabetic retinopathy; PDR proliferative diabetic retinopathy; CSME clinically significant macular edema; DME diabetic macular edema; dbh dot blot hemorrhages; CWS cotton wool spot; POAG primary open angle glaucoma; C/D cup-to-disc ratio; HVF humphrey visual field; GVF goldmann visual field; OCT optical coherence tomography; IOP intraocular pressure; BRVO Branch retinal vein occlusion; CRVO central retinal vein occlusion; CRAO central retinal artery occlusion; BRAO branch retinal artery occlusion; RT retinal tear; SB scleral buckle; PPV pars plana vitrectomy; VH Vitreous hemorrhage; PRP panretinal laser photocoagulation; IVK intravitreal kenalog; VMT vitreomacular traction; MH Macular hole;  NVD neovascularization of the disc; NVE neovascularization elsewhere; AREDS age related eye disease study; ARMD age related macular degeneration; POAG primary open angle glaucoma; EBMD epithelial/anterior basement membrane dystrophy; ACIOL anterior chamber intraocular lens; IOL intraocular lens; PCIOL posterior chamber intraocular lens; Phaco/IOL phacoemulsification with intraocular lens placement; [place] photorefractive keratectomy; LASIK laser assisted in situ keratomileusis; HTN hypertension; DM diabetes mellitus; COPD chronic obstructive pulmonary disease

## 2019-12-27 NOTE — Assessment & Plan Note (Signed)
Optic nerve cupping and some pallor to the nerve appears to have progressed by examination and also by color.photograph documentation.  This could be low-tension glaucoma or just a vascular event in any case patient is currently on two medications

## 2020-01-17 ENCOUNTER — Telehealth (INDEPENDENT_AMBULATORY_CARE_PROVIDER_SITE_OTHER): Payer: Self-pay

## 2020-01-17 NOTE — Telephone Encounter (Signed)
Adam [name] wanted further information on his Open Angle Glaucoma diagnosis. After referring with Dr. Zadie Rhine, I left a message suggesting Adam [name] should be able to receive further information from Dr. Katy Fitch whom he was referred to.

## 2020-01-22 DIAGNOSIS — H401113 Primary open-angle glaucoma, right eye, severe stage: Secondary | ICD-10-CM | POA: Diagnosis not present

## 2020-01-22 DIAGNOSIS — Z9889 Other specified postprocedural states: Secondary | ICD-10-CM | POA: Diagnosis not present

## 2020-01-22 DIAGNOSIS — H31013 Macula scars of posterior pole (postinflammatory) (post-traumatic), bilateral: Secondary | ICD-10-CM | POA: Diagnosis not present

## 2020-01-22 DIAGNOSIS — H401121 Primary open-angle glaucoma, left eye, mild stage: Secondary | ICD-10-CM | POA: Diagnosis not present

## 2020-01-22 DIAGNOSIS — Z961 Presence of intraocular lens: Secondary | ICD-10-CM | POA: Diagnosis not present

## 2020-01-23 DIAGNOSIS — R69 Illness, unspecified: Secondary | ICD-10-CM | POA: Diagnosis not present

## 2020-02-08 DIAGNOSIS — C4441 Basal cell carcinoma of skin of scalp and neck: Secondary | ICD-10-CM | POA: Diagnosis not present

## 2020-02-08 DIAGNOSIS — L940 Localized scleroderma [morphea]: Secondary | ICD-10-CM | POA: Diagnosis not present

## 2020-02-12 DIAGNOSIS — H401113 Primary open-angle glaucoma, right eye, severe stage: Secondary | ICD-10-CM | POA: Diagnosis not present

## 2020-03-06 ENCOUNTER — Other Ambulatory Visit (INDEPENDENT_AMBULATORY_CARE_PROVIDER_SITE_OTHER): Payer: Self-pay | Admitting: Ophthalmology

## 2020-03-21 DIAGNOSIS — C4441 Basal cell carcinoma of skin of scalp and neck: Secondary | ICD-10-CM | POA: Diagnosis not present

## 2020-03-26 DIAGNOSIS — H401113 Primary open-angle glaucoma, right eye, severe stage: Secondary | ICD-10-CM | POA: Diagnosis not present

## 2020-03-26 DIAGNOSIS — Z961 Presence of intraocular lens: Secondary | ICD-10-CM | POA: Diagnosis not present

## 2020-03-26 DIAGNOSIS — H401121 Primary open-angle glaucoma, left eye, mild stage: Secondary | ICD-10-CM | POA: Diagnosis not present

## 2020-03-26 DIAGNOSIS — Z9889 Other specified postprocedural states: Secondary | ICD-10-CM | POA: Diagnosis not present

## 2020-03-26 DIAGNOSIS — H31013 Macula scars of posterior pole (postinflammatory) (post-traumatic), bilateral: Secondary | ICD-10-CM | POA: Diagnosis not present

## 2020-03-28 DIAGNOSIS — R69 Illness, unspecified: Secondary | ICD-10-CM | POA: Diagnosis not present

## 2020-04-08 DIAGNOSIS — E875 Hyperkalemia: Secondary | ICD-10-CM | POA: Diagnosis not present

## 2020-04-08 DIAGNOSIS — Z6823 Body mass index (BMI) 23.0-23.9, adult: Secondary | ICD-10-CM | POA: Diagnosis not present

## 2020-04-08 DIAGNOSIS — D649 Anemia, unspecified: Secondary | ICD-10-CM | POA: Diagnosis not present

## 2020-04-08 DIAGNOSIS — E782 Mixed hyperlipidemia: Secondary | ICD-10-CM | POA: Diagnosis not present

## 2020-04-08 DIAGNOSIS — Z0000 Encounter for general adult medical examination without abnormal findings: Secondary | ICD-10-CM | POA: Diagnosis not present

## 2020-04-08 DIAGNOSIS — R7301 Impaired fasting glucose: Secondary | ICD-10-CM | POA: Diagnosis not present

## 2020-04-08 DIAGNOSIS — R7303 Prediabetes: Secondary | ICD-10-CM | POA: Diagnosis not present

## 2020-04-08 DIAGNOSIS — Z23 Encounter for immunization: Secondary | ICD-10-CM | POA: Diagnosis not present

## 2020-04-10 DIAGNOSIS — Z6823 Body mass index (BMI) 23.0-23.9, adult: Secondary | ICD-10-CM | POA: Diagnosis not present

## 2020-04-10 DIAGNOSIS — Z23 Encounter for immunization: Secondary | ICD-10-CM | POA: Diagnosis not present

## 2020-04-10 DIAGNOSIS — E782 Mixed hyperlipidemia: Secondary | ICD-10-CM | POA: Diagnosis not present

## 2020-04-10 DIAGNOSIS — R7303 Prediabetes: Secondary | ICD-10-CM | POA: Diagnosis not present

## 2020-04-10 DIAGNOSIS — D649 Anemia, unspecified: Secondary | ICD-10-CM | POA: Diagnosis not present

## 2020-04-10 DIAGNOSIS — E875 Hyperkalemia: Secondary | ICD-10-CM | POA: Diagnosis not present

## 2020-04-11 DIAGNOSIS — Z08 Encounter for follow-up examination after completed treatment for malignant neoplasm: Secondary | ICD-10-CM | POA: Diagnosis not present

## 2020-04-11 DIAGNOSIS — Z85828 Personal history of other malignant neoplasm of skin: Secondary | ICD-10-CM | POA: Diagnosis not present

## 2020-05-21 DIAGNOSIS — R69 Illness, unspecified: Secondary | ICD-10-CM | POA: Diagnosis not present

## 2020-06-12 ENCOUNTER — Other Ambulatory Visit (INDEPENDENT_AMBULATORY_CARE_PROVIDER_SITE_OTHER): Payer: Self-pay | Admitting: Ophthalmology

## 2020-06-26 DIAGNOSIS — H401121 Primary open-angle glaucoma, left eye, mild stage: Secondary | ICD-10-CM | POA: Diagnosis not present

## 2020-06-26 DIAGNOSIS — Z961 Presence of intraocular lens: Secondary | ICD-10-CM | POA: Diagnosis not present

## 2020-06-26 DIAGNOSIS — H31013 Macula scars of posterior pole (postinflammatory) (post-traumatic), bilateral: Secondary | ICD-10-CM | POA: Diagnosis not present

## 2020-06-26 DIAGNOSIS — H401113 Primary open-angle glaucoma, right eye, severe stage: Secondary | ICD-10-CM | POA: Diagnosis not present

## 2020-06-26 DIAGNOSIS — Z9889 Other specified postprocedural states: Secondary | ICD-10-CM | POA: Diagnosis not present

## 2020-07-10 DIAGNOSIS — R69 Illness, unspecified: Secondary | ICD-10-CM | POA: Diagnosis not present

## 2020-08-01 ENCOUNTER — Other Ambulatory Visit (INDEPENDENT_AMBULATORY_CARE_PROVIDER_SITE_OTHER): Payer: Self-pay | Admitting: Ophthalmology

## 2020-09-25 ENCOUNTER — Other Ambulatory Visit (INDEPENDENT_AMBULATORY_CARE_PROVIDER_SITE_OTHER): Payer: Self-pay | Admitting: Ophthalmology

## 2020-10-03 DIAGNOSIS — Z23 Encounter for immunization: Secondary | ICD-10-CM | POA: Diagnosis not present

## 2020-10-03 DIAGNOSIS — E782 Mixed hyperlipidemia: Secondary | ICD-10-CM | POA: Diagnosis not present

## 2020-10-03 DIAGNOSIS — Z0000 Encounter for general adult medical examination without abnormal findings: Secondary | ICD-10-CM | POA: Diagnosis not present

## 2020-10-03 DIAGNOSIS — Z6823 Body mass index (BMI) 23.0-23.9, adult: Secondary | ICD-10-CM | POA: Diagnosis not present

## 2020-10-03 DIAGNOSIS — R7303 Prediabetes: Secondary | ICD-10-CM | POA: Diagnosis not present

## 2020-10-03 DIAGNOSIS — D649 Anemia, unspecified: Secondary | ICD-10-CM | POA: Diagnosis not present

## 2020-10-03 DIAGNOSIS — E875 Hyperkalemia: Secondary | ICD-10-CM | POA: Diagnosis not present

## 2020-10-03 DIAGNOSIS — R7301 Impaired fasting glucose: Secondary | ICD-10-CM | POA: Diagnosis not present

## 2020-10-08 DIAGNOSIS — E875 Hyperkalemia: Secondary | ICD-10-CM | POA: Diagnosis not present

## 2020-10-08 DIAGNOSIS — R7303 Prediabetes: Secondary | ICD-10-CM | POA: Diagnosis not present

## 2020-10-08 DIAGNOSIS — Z6823 Body mass index (BMI) 23.0-23.9, adult: Secondary | ICD-10-CM | POA: Diagnosis not present

## 2020-10-08 DIAGNOSIS — E782 Mixed hyperlipidemia: Secondary | ICD-10-CM | POA: Diagnosis not present

## 2020-10-08 DIAGNOSIS — D649 Anemia, unspecified: Secondary | ICD-10-CM | POA: Diagnosis not present

## 2020-10-08 DIAGNOSIS — I951 Orthostatic hypotension: Secondary | ICD-10-CM | POA: Diagnosis not present

## 2020-10-28 ENCOUNTER — Encounter (INDEPENDENT_AMBULATORY_CARE_PROVIDER_SITE_OTHER): Payer: Self-pay

## 2020-10-28 DIAGNOSIS — H401113 Primary open-angle glaucoma, right eye, severe stage: Secondary | ICD-10-CM | POA: Diagnosis not present

## 2020-10-28 DIAGNOSIS — Z9889 Other specified postprocedural states: Secondary | ICD-10-CM | POA: Diagnosis not present

## 2020-10-28 DIAGNOSIS — H31013 Macula scars of posterior pole (postinflammatory) (post-traumatic), bilateral: Secondary | ICD-10-CM | POA: Diagnosis not present

## 2020-10-28 DIAGNOSIS — Z961 Presence of intraocular lens: Secondary | ICD-10-CM | POA: Diagnosis not present

## 2020-10-28 DIAGNOSIS — H401121 Primary open-angle glaucoma, left eye, mild stage: Secondary | ICD-10-CM | POA: Diagnosis not present

## 2020-12-26 ENCOUNTER — Encounter (INDEPENDENT_AMBULATORY_CARE_PROVIDER_SITE_OTHER): Payer: Medicare HMO | Admitting: Ophthalmology

## 2020-12-31 ENCOUNTER — Encounter (INDEPENDENT_AMBULATORY_CARE_PROVIDER_SITE_OTHER): Payer: Medicare HMO | Admitting: Ophthalmology

## 2021-01-16 ENCOUNTER — Encounter (INDEPENDENT_AMBULATORY_CARE_PROVIDER_SITE_OTHER): Payer: Medicare HMO | Admitting: Ophthalmology

## 2021-02-13 ENCOUNTER — Encounter (INDEPENDENT_AMBULATORY_CARE_PROVIDER_SITE_OTHER): Payer: Self-pay | Admitting: Ophthalmology

## 2021-02-13 ENCOUNTER — Other Ambulatory Visit: Payer: Self-pay

## 2021-02-13 ENCOUNTER — Ambulatory Visit (INDEPENDENT_AMBULATORY_CARE_PROVIDER_SITE_OTHER): Payer: Medicare HMO | Admitting: Ophthalmology

## 2021-02-13 DIAGNOSIS — H59813 Chorioretinal scars after surgery for detachment, bilateral: Secondary | ICD-10-CM | POA: Diagnosis not present

## 2021-02-13 DIAGNOSIS — H401111 Primary open-angle glaucoma, right eye, mild stage: Secondary | ICD-10-CM

## 2021-02-13 NOTE — Assessment & Plan Note (Signed)
Follow-up with Duanne Guess of [facility] eye care

## 2021-02-13 NOTE — Assessment & Plan Note (Signed)
Bilateral, stable, no new holes tears retina test OU

## 2021-02-13 NOTE — Progress Notes (Signed)
02/13/2021     CHIEF COMPLAINT Patient presents for Retina Follow Up (1 year fu OU and fp/Pt states [place] OU stable since last visit. Pt denies FOL, floaters, or ocular pain OU. /Pt reports using Brimonidine BID OU, Timolol BID OU and Latanoprost QHS OU/)   HISTORY OF PRESENT ILLNESS: Adam [name] is a 80 y.o. male who presents to the clinic today for:   HPI     Retina Follow Up           Diagnosis: Other   Laterality: both eyes   Onset: 1 year ago   Severity: mild   Duration: 1 year   Course: stable   Comments: 1 year fu OU and fp Pt states VA OU stable since last visit. Pt denies FOL, floaters, or ocular pain OU.  Pt reports using Brimonidine BID OU, Timolol BID OU and Latanoprost QHS OU        Last edited by Kendra Opitz, COA on 02/13/2021  1:54 PM.      Referring physician: Celene Squibb, MD [place],  [place] [address]  HISTORICAL INFORMATION:   Selected notes from the MEDICAL RECORD NUMBER       CURRENT MEDICATIONS: Current Outpatient Medications (Ophthalmic Drugs)  Medication Sig   brimonidine (ALPHAGAN) 0.2 % ophthalmic solution INSTILL 1 DROP INTO RIGHT EYE TWICE DAILY   timolol (BETIMOL) 0.5 % ophthalmic solution Place 1 drop into the right eye daily.    timolol (TIMOPTIC) 0.5 % ophthalmic solution INSTILL 1 DROP INTO RIGHT EYE ONCE DAILY   No current facility-administered medications for this visit. (Ophthalmic Drugs)   Current Outpatient Medications (Other)  Medication Sig   aspirin 325 MG EC tablet Take 162.5 mg by mouth daily.   HYDROcodone-acetaminophen (NORCO) 5-325 MG tablet Take 1 tablet by mouth every 6 (six) hours as needed for moderate pain. (Patient not taking: Reported on 01/10/2016)   ibuprofen (ADVIL,MOTRIN) 200 MG tablet Take 200 mg by mouth every 8 (eight) hours as needed. For pain    MAGNESIUM PO Take 250 mg by mouth once a week.    niacin 500 MG tablet Take 500 mg by mouth once a week.   No current  facility-administered medications for this visit. (Other)      REVIEW OF SYSTEMS:    ALLERGIES No Known Allergies  PAST MEDICAL HISTORY Past Medical History:  Diagnosis Date   Borderline diabetic    DIET CONTROLLED   History of kidney stones    Hyperlipemia    Paratesticular mass    PONV (postoperative nausea and vomiting)    Stroke ([place])    Right Eye / VISUAL PROBLEM RESOLVED AFTER ENDARTERECTOMY   Past Surgical History:  Procedure Laterality Date   [place]ENDECTOMY  07/1972   [place]   [facility]OT[facility] ENDARTERECTOMY  10/03/10   [facility]   CATARACT EXTRACTION W/PHACO  01/26/2011   Procedure: CATARACT EXTRACTION PHACO AND INTRAOCULAR LENS PLACEMENT ([place]);  Surgeon: Williams Che;  Location: [facility];  Service: Ophthalmology;  Laterality: Right;  CDE:8.64   CATARACT EXTRACTION W/PHACO Left 02/12/2014   Procedure: CATARACT EXTRACTION PHACO AND INTRAOCULAR LENS PLACEMENT (IOC);  Surgeon: Williams Che, MD;  Location: [facility];  Service: Ophthalmology;  Laterality: Left;  CDE 6.25   CYSTOSCOPY  1970   [place]   EYE SURGERY  06/30/2010   detached retina, gsbo   EYE SURGERY Left Nov 26, 2014   Detached Retina   HYDROCELE EXCISION Left 09/30/2015  Procedure: HYDROCELECTOMY ADULT;  Surgeon: Cleon Gustin, MD;  [facility];  Service: Urology;  Laterality: Left;   IRRIGATION AND DEBRIDEMENT SEBACEOUS CYST  2001, 2007   [place], [place], in [place] office   SKIN CANCER EXCISION  2010   pre cancerous tissue:nose, scalp, DR. Hall's office   TESTICULAR EXPLORATION Left 09/30/2015   Procedure: INGUINAL PARATESTICULAR  MASS REMOVAL;  Surgeon: Cleon Gustin, MD;  [facility];  Service: Urology;  Laterality: Left;   YAG LASER APPLICATION Right [id number]   Procedure: YAG LASER APPLICATION;  Surgeon: Williams Che, MD;  Location: [facility];  Service: Ophthalmology;  Laterality: Right;    FAMILY HISTORY Family History  Problem Relation Age of Onset   Hypertension Father    Heart disease Father         After age 53   Anesthesia problems Neg Hx    Hypotension Neg Hx    Malignant hyperthermia Neg Hx    Pseudochol deficiency Neg Hx     SOCIAL HISTORY Social History   Tobacco Use   Smoking status: Never   Smokeless tobacco: Never  Substance Use Topics   Alcohol use: No   Drug use: No         OPHTHALMIC EXAM:  Base Eye Exam     Visual Acuity (ETDRS)       Right Left   Dist [place] 20/200 Near Eye 20/25 Distance Eye   Dist ph [place] 20/30 -1          Tonometry (Tonopen, 2:00 PM)       Right Left   Pressure 10 11         Pupils       Pupils Dark Light Shape React APD   Right PERRL 4 3 Round Brisk None   Left PERRL 4 3 Round Brisk None         Visual Fields (Counting fingers)       Left Right    Full Full         Extraocular Movement       Right Left    Full Full         Neuro/Psych     Oriented x3: Yes   Mood/Affect: Normal         Dilation     Both eyes: 1.0% Mydriacyl, 2.5% Phenylephrine @ 2:00 PM           Slit Lamp and Fundus Exam     External Exam       Right Left   External Normal Normal         Slit Lamp Exam       Right Left   Lids/Lashes Normal Normal   Conjunctiva/Sclera White and quiet White and quiet   Cornea Clear Clear   Anterior Chamber Deep and quiet Deep and quiet   Iris Round and reactive Round and reactive   Lens Posterior chamber intraocular lens Posterior chamber intraocular lens   Anterior Vitreous clear clear         Fundus Exam       Right Left   Posterior Vitreous Clear vitrectomized Clear vitrectomized   Disc 3+ Optic disc atrophy, 3+ Pallor 3+ Optic disc atrophy, 3+ Pallor   C/D Ratio 0.85 0.75   Macula Normal Normal   Vessels Normal Normal   Periphery Good scleral buckle OD, retina attached.  Vitreous clear.   Good scleral buckle OD, retina attached.  Vitreous clear.  IMAGING AND PROCEDURES  Imaging and Procedures for 02/13/21  Color Fundus Photography Optos  - OU - Both Eyes       Right Eye Progression has worsened. Disc findings include increased cup to disc ratio, pallor. Macula : normal observations. Vessels : normal observations.   Left Eye Progression has been stable. Disc findings include increased cup to disc ratio. Macula : normal observations. Vessels : normal observations.   Notes OD, color photo documentation of increased cup-to-disc ratio, some disc pallor as well.  Uncontrolled intraocular pressure this suggest low-tension glaucoma and and its possible attendant proximal vascular deficits    OU retina attached, clear media  AG followed by Dr. 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 Primary open angle glaucoma of right eye, mild stage  H40.1111       1.  No new retinal findings, eye stable follow-up here in 2 years or as needed  2.  3.  Ophthalmic Meds Ordered this visit:  No orders of the defined types were placed in this encounter.      Return in about 2 years (around 02/14/2023) for DILATE OU, COLOR FP, OCT.  There are no Patient Instructions on file for this visit.   Explained the diagnoses, plan, and follow up with the patient and they expressed understanding.  Patient expressed understanding of the importance of proper follow up care.   Clent Demark Kaliope Quinonez M.D. Diseases & Surgery of the Retina and Vitreous Retina & Diabetic [place] 02/13/21     Abbreviations: M myopia (nearsighted); A astigmatism; H hyperopia (farsighted); P presbyopia; Mrx spectacle prescription;  CTL contact lenses; OD right eye; OS left eye; OU both eyes   XT exotropia; ET esotropia; PEK punctate epithelial keratitis; PEE punctate epithelial erosions; DES dry eye syndrome; MGD meibomian gland dysfunction; ATs artificial tears; PFAT's preservative free artificial tears; [place] nuclear sclerotic cataract; PSC posterior subcapsular cataract; ERM epi-retinal membrane; PVD posterior vitreous detachment; RD retinal detachment; DM diabetes mellitus; DR diabetic retinopathy; NPDR non-proliferative diabetic retinopathy; PDR proliferative diabetic retinopathy; CSME clinically significant macular edema; DME diabetic macular edema; dbh dot blot hemorrhages; CWS cotton wool spot; POAG primary open angle glaucoma; C/D cup-to-disc ratio; HVF humphrey visual field; GVF goldmann visual field; OCT optical coherence tomography; IOP intraocular pressure; BRVO Branch retinal vein occlusion; CRVO central retinal vein occlusion; CRAO central retinal artery occlusion; BRAO branch retinal artery occlusion; RT retinal tear; SB scleral buckle; PPV pars plana vitrectomy; VH Vitreous hemorrhage; PRP panretinal laser photocoagulation; IVK intravitreal kenalog; VMT vitreomacular traction; MH Macular hole;  NVD neovascularization of the disc; NVE neovascularization elsewhere; AREDS age related eye disease study; ARMD age related macular degeneration; POAG primary open angle glaucoma; EBMD epithelial/anterior basement membrane dystrophy; ACIOL anterior chamber intraocular lens; IOL intraocular lens; PCIOL posterior chamber intraocular lens; Phaco/IOL phacoemulsification with intraocular lens placement; [place] photorefractive keratectomy; LASIK laser assisted in situ keratomileusis; HTN hypertension; DM diabetes mellitus; COPD chronic obstructive pulmonary disease

## 2021-03-26 DIAGNOSIS — H31013 Macula scars of posterior pole (postinflammatory) (post-traumatic), bilateral: Secondary | ICD-10-CM | POA: Diagnosis not present

## 2021-03-26 DIAGNOSIS — Z961 Presence of intraocular lens: Secondary | ICD-10-CM | POA: Diagnosis not present

## 2021-03-26 DIAGNOSIS — H401121 Primary open-angle glaucoma, left eye, mild stage: Secondary | ICD-10-CM | POA: Diagnosis not present

## 2021-03-26 DIAGNOSIS — H401113 Primary open-angle glaucoma, right eye, severe stage: Secondary | ICD-10-CM | POA: Diagnosis not present

## 2021-03-26 DIAGNOSIS — Z9889 Other specified postprocedural states: Secondary | ICD-10-CM | POA: Diagnosis not present

## 2021-04-09 DIAGNOSIS — R7301 Impaired fasting glucose: Secondary | ICD-10-CM | POA: Diagnosis not present

## 2021-04-09 DIAGNOSIS — D509 Iron deficiency anemia, unspecified: Secondary | ICD-10-CM | POA: Diagnosis not present

## 2021-04-09 DIAGNOSIS — E782 Mixed hyperlipidemia: Secondary | ICD-10-CM | POA: Diagnosis not present

## 2021-04-15 DIAGNOSIS — E782 Mixed hyperlipidemia: Secondary | ICD-10-CM | POA: Diagnosis not present

## 2021-04-15 DIAGNOSIS — Z23 Encounter for immunization: Secondary | ICD-10-CM | POA: Diagnosis not present

## 2021-04-15 DIAGNOSIS — R7301 Impaired fasting glucose: Secondary | ICD-10-CM | POA: Diagnosis not present

## 2021-04-15 DIAGNOSIS — D509 Iron deficiency anemia, unspecified: Secondary | ICD-10-CM | POA: Diagnosis not present

## 2021-04-15 DIAGNOSIS — Z0001 Encounter for general adult medical examination with abnormal findings: Secondary | ICD-10-CM | POA: Diagnosis not present

## 2021-06-04 ENCOUNTER — Encounter (HOSPITAL_COMMUNITY): Payer: Self-pay

## 2021-06-04 ENCOUNTER — Emergency Department (HOSPITAL_COMMUNITY): Payer: Medicare HMO

## 2021-06-04 ENCOUNTER — Other Ambulatory Visit: Payer: Self-pay

## 2021-06-04 ENCOUNTER — Inpatient Hospital Stay (HOSPITAL_COMMUNITY)
Admission: EM | Admit: 2021-06-04 | Discharge: 2021-06-15 | DRG: 234 | Disposition: A | Discharge status: Home / Self Care | Payer: Medicare HMO | Attending: Thoracic Surgery (Cardiothoracic Vascular Surgery) | Admitting: Thoracic Surgery (Cardiothoracic Vascular Surgery)

## 2021-06-04 DIAGNOSIS — I480 Paroxysmal atrial fibrillation: Secondary | ICD-10-CM | POA: Diagnosis not present

## 2021-06-04 DIAGNOSIS — Z87442 Personal history of urinary calculi: Secondary | ICD-10-CM | POA: Diagnosis not present

## 2021-06-04 DIAGNOSIS — J9811 Atelectasis: Secondary | ICD-10-CM | POA: Diagnosis not present

## 2021-06-04 DIAGNOSIS — I241 Dressler's syndrome: Secondary | ICD-10-CM | POA: Diagnosis not present

## 2021-06-04 DIAGNOSIS — Z951 Presence of aortocoronary bypass graft: Secondary | ICD-10-CM

## 2021-06-04 DIAGNOSIS — Z7982 Long term (current) use of aspirin: Secondary | ICD-10-CM | POA: Diagnosis not present

## 2021-06-04 DIAGNOSIS — I25110 Atherosclerotic heart disease of native coronary artery with unstable angina pectoris: Secondary | ICD-10-CM | POA: Diagnosis not present

## 2021-06-04 DIAGNOSIS — D62 Acute posthemorrhagic anemia: Secondary | ICD-10-CM | POA: Diagnosis not present

## 2021-06-04 DIAGNOSIS — Z01810 Encounter for preprocedural cardiovascular examination: Secondary | ICD-10-CM

## 2021-06-04 DIAGNOSIS — H409 Unspecified glaucoma: Secondary | ICD-10-CM | POA: Diagnosis not present

## 2021-06-04 DIAGNOSIS — E119 Type 2 diabetes mellitus without complications: Secondary | ICD-10-CM | POA: Diagnosis not present

## 2021-06-04 DIAGNOSIS — I4891 Unspecified atrial fibrillation: Secondary | ICD-10-CM | POA: Diagnosis not present

## 2021-06-04 DIAGNOSIS — N281 Cyst of kidney, acquired: Secondary | ICD-10-CM | POA: Diagnosis not present

## 2021-06-04 DIAGNOSIS — R918 Other nonspecific abnormal finding of lung field: Secondary | ICD-10-CM | POA: Diagnosis not present

## 2021-06-04 DIAGNOSIS — J984 Other disorders of lung: Secondary | ICD-10-CM | POA: Diagnosis not present

## 2021-06-04 DIAGNOSIS — R079 Chest pain, unspecified: Secondary | ICD-10-CM | POA: Diagnosis not present

## 2021-06-04 DIAGNOSIS — R9431 Abnormal electrocardiogram [ECG] [EKG]: Secondary | ICD-10-CM | POA: Diagnosis not present

## 2021-06-04 DIAGNOSIS — I10 Essential (primary) hypertension: Secondary | ICD-10-CM | POA: Diagnosis not present

## 2021-06-04 DIAGNOSIS — Z8673 Personal history of transient ischemic attack (TIA), and cerebral infarction without residual deficits: Secondary | ICD-10-CM | POA: Diagnosis not present

## 2021-06-04 DIAGNOSIS — N200 Calculus of kidney: Secondary | ICD-10-CM | POA: Diagnosis not present

## 2021-06-04 DIAGNOSIS — I471 Supraventricular tachycardia: Secondary | ICD-10-CM | POA: Diagnosis not present

## 2021-06-04 DIAGNOSIS — R0602 Shortness of breath: Secondary | ICD-10-CM | POA: Diagnosis not present

## 2021-06-04 DIAGNOSIS — E785 Hyperlipidemia, unspecified: Secondary | ICD-10-CM | POA: Diagnosis not present

## 2021-06-04 DIAGNOSIS — S3991XA Unspecified injury of abdomen, initial encounter: Secondary | ICD-10-CM | POA: Diagnosis not present

## 2021-06-04 DIAGNOSIS — J811 Chronic pulmonary edema: Secondary | ICD-10-CM | POA: Diagnosis not present

## 2021-06-04 DIAGNOSIS — J939 Pneumothorax, unspecified: Secondary | ICD-10-CM | POA: Diagnosis not present

## 2021-06-04 DIAGNOSIS — J8410 Pulmonary fibrosis, unspecified: Secondary | ICD-10-CM | POA: Diagnosis not present

## 2021-06-04 DIAGNOSIS — I081 Rheumatic disorders of both mitral and tricuspid valves: Secondary | ICD-10-CM | POA: Diagnosis not present

## 2021-06-04 DIAGNOSIS — E782 Mixed hyperlipidemia: Secondary | ICD-10-CM | POA: Diagnosis not present

## 2021-06-04 DIAGNOSIS — Z01818 Encounter for other preprocedural examination: Secondary | ICD-10-CM | POA: Diagnosis not present

## 2021-06-04 DIAGNOSIS — I6522 Occlusion and stenosis of left carotid artery: Secondary | ICD-10-CM | POA: Diagnosis not present

## 2021-06-04 DIAGNOSIS — Z20822 Contact with and (suspected) exposure to covid-19: Secondary | ICD-10-CM | POA: Diagnosis not present

## 2021-06-04 DIAGNOSIS — S299XXA Unspecified injury of thorax, initial encounter: Secondary | ICD-10-CM | POA: Diagnosis not present

## 2021-06-04 DIAGNOSIS — J90 Pleural effusion, not elsewhere classified: Secondary | ICD-10-CM | POA: Diagnosis not present

## 2021-06-04 DIAGNOSIS — Z85828 Personal history of other malignant neoplasm of skin: Secondary | ICD-10-CM

## 2021-06-04 DIAGNOSIS — R112 Nausea with vomiting, unspecified: Secondary | ICD-10-CM | POA: Diagnosis not present

## 2021-06-04 DIAGNOSIS — Z8249 Family history of ischemic heart disease and other diseases of the circulatory system: Secondary | ICD-10-CM

## 2021-06-04 DIAGNOSIS — I214 Non-ST elevation (NSTEMI) myocardial infarction: Secondary | ICD-10-CM | POA: Diagnosis not present

## 2021-06-04 DIAGNOSIS — I2510 Atherosclerotic heart disease of native coronary artery without angina pectoris: Secondary | ICD-10-CM | POA: Diagnosis not present

## 2021-06-04 DIAGNOSIS — Z4682 Encounter for fitting and adjustment of non-vascular catheter: Secondary | ICD-10-CM | POA: Diagnosis not present

## 2021-06-04 DIAGNOSIS — Z79899 Other long term (current) drug therapy: Secondary | ICD-10-CM

## 2021-06-04 DIAGNOSIS — I701 Atherosclerosis of renal artery: Secondary | ICD-10-CM | POA: Diagnosis not present

## 2021-06-04 LAB — CBC
HCT: 39.9 % (ref 39.0–52.0)
Hemoglobin: 12.9 g/dL — ABNORMAL LOW (ref 13.0–17.0)
MCH: 32.1 pg (ref 26.0–34.0)
MCHC: 32.3 g/dL (ref 30.0–36.0)
MCV: 99.3 fL (ref 80.0–100.0)
Platelets: 197 10*3/uL (ref 150–400)
RBC: 4.02 MIL/uL — ABNORMAL LOW (ref 4.22–5.81)
RDW: 12 % (ref 11.5–15.5)
WBC: 8.7 10*3/uL (ref 4.0–10.5)
nRBC: 0 % (ref 0.0–0.2)

## 2021-06-04 LAB — TROPONIN I (HIGH SENSITIVITY)
Troponin I (High Sensitivity): 2952 ng/L (ref <18)
Troponin I (High Sensitivity): 11026 ng/L (ref <18)

## 2021-06-04 LAB — BASIC METABOLIC PANEL
Anion gap: 8 (ref 5–15)
BUN: 24 mg/dL — ABNORMAL HIGH (ref 8–23)
CO2: 28 mmol/L (ref 22–32)
Calcium: 9.1 mg/dL (ref 8.9–10.3)
Chloride: 101 mmol/L (ref 98–111)
Creatinine, Ser: 0.94 mg/dL (ref 0.61–1.24)
GFR, Estimated: >60 mL/min (ref >60)
Glucose, Bld: 167 mg/dL — ABNORMAL HIGH (ref 70–99)
Potassium: 4.1 mmol/L (ref 3.5–5.1)
Sodium: 137 mmol/L (ref 135–145)

## 2021-06-04 LAB — RESP PANEL BY RT-PCR (FLU A&B, COVID) ARPGX2
Influenza A by PCR: NEGATIVE
Influenza B by PCR: NEGATIVE
SARS Coronavirus 2 by RT PCR: NEGATIVE

## 2021-06-04 MED ORDER — HEPARIN BOLUS VIA INFUSION
4000.0000 [IU] | Freq: Once | INTRAVENOUS | Status: AC
  Administered 2021-06-04: 4000 [IU] via INTRAVENOUS

## 2021-06-04 MED ORDER — ONDANSETRON HCL 4 MG/2ML IJ SOLN
INTRAMUSCULAR | Status: AC
  Administered 2021-06-04: 4 mg via INTRAVENOUS
  Filled 2021-06-04: qty 2

## 2021-06-04 MED ORDER — NITROGLYCERIN IN D5W 200-5 MCG/ML-% IV SOLN
0.0000 ug/min | INTRAVENOUS | Status: DC
  Administered 2021-06-04: 20:00:00 5 ug/min via INTRAVENOUS
  Filled 2021-06-04: qty 250

## 2021-06-04 MED ORDER — MORPHINE SULFATE (PF) 4 MG/ML IV SOLN
4.0000 mg | Freq: Once | INTRAVENOUS | Status: AC
  Administered 2021-06-04: 4 mg via INTRAVENOUS
  Filled 2021-06-04: qty 1

## 2021-06-04 MED ORDER — HEPARIN (PORCINE) 25000 UT/250ML-% IV SOLN
1100.0000 [IU]/h | INTRAVENOUS | Status: DC
  Administered 2021-06-04 – 2021-06-05 (×2): 1100 [IU]/h via INTRAVENOUS
  Filled 2021-06-04 (×2): qty 250

## 2021-06-04 MED ORDER — ASPIRIN 81 MG PO CHEW
324.0000 mg | CHEWABLE_TABLET | Freq: Once | ORAL | Status: AC
  Administered 2021-06-04: 324 mg via ORAL
  Filled 2021-06-04: qty 4

## 2021-06-04 MED ORDER — IOHEXOL 350 MG/ML SOLN
100.0000 mL | Freq: Once | INTRAVENOUS | Status: AC | PRN
  Administered 2021-06-04: 100 mL via INTRAVENOUS

## 2021-06-04 MED ORDER — ONDANSETRON HCL 4 MG/2ML IJ SOLN
4.0000 mg | Freq: Once | INTRAMUSCULAR | Status: AC
  Filled 2021-06-04: qty 2

## 2021-06-04 MED ORDER — NITROGLYCERIN 0.4 MG SL SUBL: 0.4000 mg | SUBLINGUAL_TABLET | SUBLINGUAL | Status: DC | PRN

## 2021-06-04 NOTE — ED Notes (Signed)
Date and time results received: 06/04/21 2115 (use smartphrase ".now" to insert current time)  Test: troponin Critical Value: 11,026  Name of Provider Notified: Dr. Gilford Raid  Orders Received? Or Actions Taken?:  no/na

## 2021-06-04 NOTE — Progress Notes (Signed)
   Progress Note  Patient Name: Adam [name] Date of Encounter: 06/04/2021  Primary Cardiologist: New to [facility]  Received telephone call from Dr. Gilford Raid this evening while on call at [facility].  Adam [name] is a patient of Dr. Nevada Crane in [place], no prior history of cardiac disease, but chart indicating history of previous stroke, type 2 diabetes mellitus, and hyperlipidemia.  He presented to the ER at [facility] reporting recent onset chest discomfort, episode occurred yesterday, then again today after moving some wood.  Lab work shows a high-sensitivity troponin I of 2952.  Patient reports mild residual left arm discomfort.  He is otherwise hemodynamically stable per chart review of vital signs.  Chest x-ray reports no acute process, chest CTA also ordered by ER staff and pending.  I personally reviewed his ECG today which shows sinus rhythm with nonspecific ST segment abnormalities and frequent PVCs.  Patient with evidence of NSTEMI and recent onset episodic chest and left arm discomfort.  Plan is transfer to our cardiology service in the progressive care unit at [facility] pending bed availability.  If chest CTA is clear for dissection or other acute process that would be a contraindication to anticoagulation, would plan to start IV heparin.  Also initiate low-dose IV nitroglycerin given residual left arm discomfort.  Patient to be evaluated by cardiology fellow on-call overnight, and would anticipate plan for diagnostic cardiac catheterization during his hospital stay for assessment of coronary anatomy and revascularization options.  If there is any change in his clinical status of importance and/or difficulty with bed availability or transfer status, please recontact cardiology service on call for update.  Signed, Rozann Lesches, MD  06/04/2021, 7:45 PM

## 2021-06-04 NOTE — ED Notes (Signed)
Pt back from CT

## 2021-06-04 NOTE — ED Provider Notes (Signed)
[facility] Provider Note   CSN: [id number] Arrival date & time: 06/04/21  1647     History Chief Complaint  Patient presents with   Chest Pain    Adam [name] is a 80 y.o. male.  Pt presents to the ED today with cp.  Pt had some cp last night, but it went away.  This afternoon, he was moving some wood and had the pain come back worse than it was.  He said it went into his back and down his left arm.  He has no sob, but he did vomit prior to coming here.  Pt does not have a hx of CAD.        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 12/27/2019   Primary open angle glaucoma of right eye, mild stage 12/27/2019   Posterior vitreous detachment of right eye 12/27/2019   History of retinal detachment 12/27/2019   History of vitrectomy 12/27/2019   Primary open angle glaucoma of right eye, severe stage 12/27/2019   Occlusion and stenosis of carotid artery without mention of cerebral infarction 01/02/2014   Aftercare following surgery of the circulatory system, NEC 01/02/2014    Past Surgical History:  Procedure Laterality Date   APPENDECTOMY  07/1972   [place]   CAROTID ENDARTERECTOMY  10/03/10   [facility]   CATARACT EXTRACTION W/PHACO  01/26/2011   Procedure: CATARACT EXTRACTION PHACO AND INTRAOCULAR LENS PLACEMENT ([place]);  Surgeon: Williams Che;  Location: [facility];  Service: Ophthalmology;  Laterality: Right;  CDE:8.64   CATARACT EXTRACTION W/PHACO Left 02/12/2014   Procedure: CATARACT EXTRACTION PHACO AND INTRAOCULAR LENS PLACEMENT ([facility]);   Surgeon: Williams Che, MD;  Location: [facility];  Service: Ophthalmology;  Laterality: Left;  CDE 6.25   CYSTOSCOPY  1970   MD   EYE SURGERY  06/30/2010   detached retina, gsbo   EYE SURGERY Left Nov 26, 2014   Detached Retina   HYDROCELE EXCISION Left 09/30/2015   Procedure: HYDROCELECTOMY ADULT;  Surgeon: Cleon Gustin, MD;  Location: [facility];  Service: Urology;  Laterality: Left;   IRRIGATION AND DEBRIDEMENT SEBACEOUS CYST  2001, 2007   [place], [place], in MD office   SKIN CANCER EXCISION  2010   pre cancerous tissue:nose, scalp, DR. Hall's office   TESTICULAR EXPLORATION Left 09/30/2015   Procedure: INGUINAL PARATESTICULAR  MASS REMOVAL;  Surgeon: Cleon Gustin, MD;  Location: [facility];  Service: Urology;  Laterality: Left;   YAG LASER APPLICATION Right 10/14/1538   Procedure: YAG LASER APPLICATION;  Surgeon: Williams Che, MD;  Location: [facility];  Service: Ophthalmology;  Laterality: Right;       Family History  Problem Relation Age of Onset   Hypertension Father    Heart disease Father        After age 42   Anesthesia problems Neg Hx    Hypotension Neg Hx    Malignant hyperthermia Neg Hx    Pseudochol deficiency Neg Hx     Social History  Tobacco Use   Smoking status: Never   Smokeless tobacco: Never  Substance Use Topics   Alcohol use: No   Drug use: No    Home Medications Prior to Admission medications   Medication Sig Start Date End Date Taking? Authorizing Provider  aspirin EC 81 MG tablet Take 81 mg by mouth daily as needed (blood thinner). Swallow whole.   Yes [provider]  brimonidine (ALPHAGAN) 0.2 % ophthalmic solution INSTILL 1 DROP INTO RIGHT EYE TWICE DAILY 09/30/20  Yes Rankin, Clent Demark, MD  Cholecalciferol (VITAMIN D3 PO) Take 1 tablet by mouth daily.   Yes [provider]  Chromium 1 MG CAPS Take 1 mg by mouth daily.   Yes [provider]  ibuprofen (ADVIL,MOTRIN) 200 MG tablet Take 200 mg by mouth every 8 (eight) hours as  needed. For pain    Yes [provider]  latanoprost (XALATAN) 0.005 % ophthalmic solution Place 1 drop into both eyes at bedtime. 05/21/21  Yes [provider]  MAGNESIUM PO Take 250 mg by mouth once a week.    Yes [provider]  timolol (TIMOPTIC) 0.5 % ophthalmic solution INSTILL 1 DROP INTO RIGHT EYE ONCE DAILY Patient taking differently: Place 1 drop into the right eye 2 (two) times daily. 09/30/20  Yes Rankin, Clent Demark, MD  VANADIUM PO Take 1 tablet by mouth daily.   Yes [provider]  aspirin 325 MG EC tablet Take 162.5 mg by mouth daily. Patient not taking: Reported on 06/04/2021    [provider]  HYDROcodone-acetaminophen (NORCO) 5-325 MG tablet Take 1 tablet by mouth every 6 (six) hours as needed for moderate pain. Patient not taking: Reported on 01/10/2016 09/30/15   Cleon Gustin, MD  niacin 500 MG tablet Take 500 mg by mouth once a week. Patient not taking: Reported on 06/04/2021    [provider]  timolol (BETIMOL) 0.5 % ophthalmic solution Place 1 drop into the right eye daily.  Patient not taking: Reported on 06/04/2021    [provider]    Allergies    Patient has no known allergies.  Review of Systems   Review of Systems  Cardiovascular:  Positive for chest pain.  All other systems reviewed and are negative.  Physical Exam Updated Vital Signs BP 121/72   Pulse 60   Temp 97.7 F (36.5 C) (Oral)   Resp 16   Ht 5\' 10"  (1.778 m)   Wt 73 kg   SpO2 100%   BMI 23.09 kg/m   Physical Exam Vitals and nursing note reviewed.  Constitutional:      Appearance: He is well-developed.  HENT:     Head: Normocephalic and atraumatic.  Eyes:     Extraocular Movements: Extraocular movements intact.     Pupils: Pupils are equal, round, and reactive to light.  Cardiovascular:     Rate and Rhythm: Normal rate and regular rhythm.     Heart sounds: Normal heart sounds.  Pulmonary:     Effort: Pulmonary  effort is normal.     Breath sounds: Normal breath sounds.  Abdominal:     General: Bowel sounds are normal.     Palpations: Abdomen is soft.  Musculoskeletal:        General: Normal range of motion.     Cervical back: Normal range of motion and neck supple.  Skin:    General: Skin is warm.     Capillary Refill: Capillary refill takes less than 2 seconds.  Neurological:     General: No focal deficit present.     Mental Status: He is alert and oriented to person, place, and time.  Psychiatric:        Mood and Affect: Mood normal.        Behavior: Behavior normal.    ED Results / Procedures / Treatments   Labs (all labs ordered are listed, but only abnormal results are displayed) Labs Reviewed  BASIC METABOLIC PANEL - Abnormal; Notable for the following components:      Result Value   Glucose, Bld 167 (*)    BUN 24 (*)    All other components within normal limits  CBC - Abnormal; Notable for the following components:   RBC 4.02 (*)    Hemoglobin 12.9 (*)    All other components within normal limits  TROPONIN I (HIGH SENSITIVITY) - Abnormal; Notable for the following components:   Troponin I (High Sensitivity) 2,952 (*)    All other components within normal limits  RESP PANEL BY RT-PCR (FLU A&B, COVID) ARPGX2  HEPARIN LEVEL (UNFRACTIONATED)  CBC  TROPONIN I (HIGH SENSITIVITY)    EKG EKG Interpretation  Date/Time:  Wednesday June 04 2021 17:00:57 EST Ventricular Rate:  70 PR Interval:  170 QRS Duration: 96 QT Interval:  416 QTC Calculation: 449 R Axis:   83 Text Interpretation: Sinus rhythm with frequent Premature ventricular complexes ST depression, consider subendocardial injury Abnormal ECG PVCs are new Confirmed by Isla Pence [phone]) on 06/04/2021 6:23:38 PM  Radiology DG Chest Port 1 View  Result Date: 06/04/2021 CLINICAL DATA:  Short of breath, chest pain EXAM: PORTABLE CHEST 1 VIEW COMPARISON:  10/02/2010 FINDINGS: Single frontal view of the chest  demonstrates an unremarkable cardiac silhouette. Diffuse parenchymal lung scarring is noted without airspace disease, effusion, or pneumothorax. No acute bony abnormalities. IMPRESSION: 1. No acute intrathoracic process. Electronically Signed   By: Randa Ngo M.D.   On: 06/04/2021 17:38   CT Angio Chest/Abd/Pel for Dissection W and/or Wo Contrast  Result Date: 06/04/2021 CLINICAL DATA:  Chest and bilateral arm pain, lifting injury EXAM: CT ANGIOGRAPHY CHEST, ABDOMEN AND PELVIS TECHNIQUE: Non-contrast CT of the chest was initially obtained. Multidetector CT imaging through the chest, abdomen and pelvis was performed using the standard protocol during bolus administration of intravenous contrast. Multiplanar reconstructed images and MIPs were obtained and reviewed to evaluate the vascular anatomy. CONTRAST:  133mL OMNIPAQUE IOHEXOL 350 MG/ML SOLN COMPARISON:  06/04/2021 FINDINGS: CTA CHEST FINDINGS Cardiovascular: The thoracic aorta is unremarkable without aneurysm or dissection. Mild atherosclerosis of the aorta. The heart is unremarkable without pericardial effusion. Extensive atherosclerosis of the coronary vasculature greatest in the LAD and circumflex distributions. While not optimized for evaluation of the pulmonary vasculature, there is sufficient contrast enhancement to exclude pulmonary emboli. No filling defects. Mediastinum/Nodes: No enlarged mediastinal, hilar, or axillary lymph nodes. Thyroid gland, trachea, and esophagus demonstrate no significant findings. Lungs/Pleura: Diffuse subpleural scarring and fibrosis. No acute airspace disease, effusion, or pneumothorax. Central airways are patent. Musculoskeletal: No acute or destructive bony lesions. Reconstructed images demonstrate no additional findings. Review of the MIP images confirms the above findings. CTA ABDOMEN AND PELVIS FINDINGS VASCULAR Aorta: Normal caliber aorta without aneurysm, dissection, vasculitis or significant stenosis. Mild  atherosclerosis. Celiac: Patent without evidence of aneurysm, dissection, vasculitis or significant stenosis. Mild atherosclerosis. SMA: Patent without evidence of aneurysm, dissection, vasculitis or significant stenosis. Mild atherosclerosis. Renals: Both renal arteries are patent without evidence of aneurysm, dissection, vasculitis, fibromuscular dysplasia or significant  stenosis. There is moderate atherosclerosis at the origin of the bilateral renal arteries without critical stenosis. IMA: Patent without evidence of aneurysm, dissection, vasculitis or significant stenosis. Inflow: Patent without evidence of aneurysm, dissection, vasculitis or significant stenosis. Veins: No obvious venous abnormality within the limitations of this arterial phase study. Review of the MIP images confirms the above findings. NON-VASCULAR Hepatobiliary: No focal liver abnormality is seen. No gallstones, gallbladder wall thickening, or biliary dilatation. Pancreas: Unremarkable. No pancreatic ductal dilatation or surrounding inflammatory changes. Spleen: Normal in size without focal abnormality. Adrenals/Urinary Tract: There is a nonobstructing 11 mm calculus within the lower pole right kidney. Bilateral renal cortical thinning. Benign cyst upper pole right kidney. The adrenals and bladder are unremarkable. Stomach/Bowel: No bowel obstruction or ileus. Scattered diverticulosis throughout the colon without diverticulitis. No bowel wall thickening or inflammatory change. Lymphatic: No pathologic adenopathy. Reproductive: Prostate is unremarkable. Other: No free fluid or free gas.  No abdominal wall hernia. Musculoskeletal: No acute or destructive bony lesions. Right convex lumbar scoliosis and multilevel spondylosis. Reconstructed images demonstrate no additional findings. Review of the MIP images confirms the above findings. IMPRESSION: 1. No evidence of thoracoabdominal aortic aneurysm or dissection. Mild diffuse atherosclerosis. 2.  No evidence of pulmonary embolus. 3. Nonobstructing 11 mm right renal calculus. 4. Aortic Atherosclerosis (ICD10-I70.0). Coronary artery atherosclerosis. Electronically Signed   By: Randa Ngo M.D.   On: 06/04/2021 19:57    Procedures Procedures   Medications Ordered in ED Medications  nitroGLYCERIN (NITROSTAT) SL tablet 0.4 mg (has no administration in time range)  nitroGLYCERIN 50 mg in dextrose 5 % 250 mL (0.2 mg/mL) infusion (5 mcg/min Intravenous New Bag/Given 06/04/21 2005)  heparin bolus via infusion 4,000 Units (4,000 Units Intravenous Bolus from Bag 06/04/21 2040)    Followed by  heparin ADULT infusion 100 units/mL (25000 units/274mL) (1,100 Units/hr Intravenous New Bag/Given 06/04/21 2042)  aspirin chewable tablet 324 mg (324 mg Oral Given 06/04/21 1809)  ondansetron (ZOFRAN) injection 4 mg (4 mg Intravenous Given 06/04/21 2000)  morphine 4 MG/ML injection 4 mg (4 mg Intravenous Given 06/04/21 2000)  iohexol (OMNIPAQUE) 350 MG/ML injection 100 mL (100 mLs Intravenous Contrast Given 06/04/21 1931)    ED Course  I have reviewed the triage vital signs and the nursing notes.  Pertinent labs & imaging results that were available during my care of the patient were reviewed by me and considered in my medical decision making (see chart for details).    MDM Rules/Calculators/A&P                           Pt's CP is improved after nitro, but is still there.  Pt started on a nitro drip and given morphine.  Heparin drip started after CT neg for dissection.  Pt's troponin came back at 2952.    Pt d/w Dr. Domenic Polite (cards) who will accept for transfer to [facility].  CRITICAL CARE Performed by: Isla Pence   Total critical care time: 30 minutes  Critical care time was exclusive of separately billable procedures and treating other patients.  Critical care was necessary to treat or prevent imminent or life-threatening deterioration.  Critical care was time spent personally by me  on the following activities: development of treatment plan with patient and/or surrogate as well as nursing, discussions with consultants, evaluation of patient's response to treatment, examination of patient, obtaining history from patient or surrogate, ordering and performing treatments and interventions, ordering and review of laboratory studies,  ordering and review of radiographic studies, pulse oximetry and re-evaluation of patient's condition.  Final Clinical Impression(s) / ED Diagnoses Final diagnoses:  NSTEMI (non-ST elevated myocardial infarction) [facility])    Rx / [place] Orders ED Discharge Orders     None        Isla Pence, MD 06/04/21 2105

## 2021-06-04 NOTE — ED Triage Notes (Signed)
Experienced back and bilateral arm pain after exerting self lifting wood, vomited and now in constant pain. Reports aching in chest and back for multiple weeks. Denies SOB with episode today

## 2021-06-04 NOTE — Progress Notes (Signed)
ANTICOAGULATION CONSULT NOTE - Initial Consult  Pharmacy Consult for heparin Indication: chest pain/ACS  No Known Allergies  Patient Measurements: Height: 5\' 10"  (177.8 cm) Weight: 73 kg (160 lb 15 oz) IBW/kg (Calculated) : 73 Heparin Dosing Weight: 73kg  Vital Signs: Temp: 97.7 F (36.5 C) (11/23 1702) Temp Source: Oral (11/23 1702) BP: 156/70 (11/23 1951) Pulse Rate: 66 (11/23 1951)  Labs: Recent Labs    06/04/21 1812  HGB 12.9*  HCT 39.9  PLT 197  CREATININE 0.94  TROPONINIHS 2,952*    Estimated Creatinine Clearance: 66.9 mL/min (by C-G formula based on SCr of 0.94 mg/dL).   Medical History: Past Medical History:  Diagnosis Date   Borderline diabetic    DIET CONTROLLED   History of kidney stones    Hyperlipemia    Paratesticular mass    PONV (postoperative nausea and vomiting)    Stroke ([place])    Right Eye / VISUAL PROBLEM RESOLVED AFTER ENDARTERECTOMY    Medications:  (Not in a hospital admission)  Scheduled:    morphine injection  4 mg Intravenous Once   ondansetron (ZOFRAN) IV  4 mg Intravenous Once   Infusions:   nitroGLYCERIN      Assessment: Pt with a hx of CVA presented with new CP. Heparin has been ordered to r/o MI before transferring to [facility] for cath. CT didn't show dissection or PE  Scr<1 Hgb 12.9, plt wnl  Goal of Therapy:  Heparin level 0.3-0.7 units/ml Monitor platelets by anticoagulation protocol: Yes   Plan:  Heparin bolust 4000 units x1 Heparin infusion 1100 units/hr Check heparin level in AM then daily  Onnie Boer, PharmD, BCIDP, AAHIVP, CPP Infectious Disease Pharmacist 06/04/2021 7:59 PM

## 2021-06-05 DIAGNOSIS — I214 Non-ST elevation (NSTEMI) myocardial infarction: Secondary | ICD-10-CM

## 2021-06-05 LAB — CBC
HCT: 33.8 % — ABNORMAL LOW (ref 39.0–52.0)
HCT: 34.4 % — ABNORMAL LOW (ref 39.0–52.0)
Hemoglobin: 11.4 g/dL — ABNORMAL LOW (ref 13.0–17.0)
Hemoglobin: 11.4 g/dL — ABNORMAL LOW (ref 13.0–17.0)
MCH: 32 pg (ref 26.0–34.0)
MCH: 31.4 pg (ref 26.0–34.0)
MCHC: 33.7 g/dL (ref 30.0–36.0)
MCHC: 33.1 g/dL (ref 30.0–36.0)
MCV: 94.9 fL (ref 80.0–100.0)
MCV: 94.8 fL (ref 80.0–100.0)
Platelets: 178 10*3/uL (ref 150–400)
Platelets: 162 10*3/uL (ref 150–400)
RBC: 3.56 MIL/uL — ABNORMAL LOW (ref 4.22–5.81)
RBC: 3.63 MIL/uL — ABNORMAL LOW (ref 4.22–5.81)
RDW: 12 % (ref 11.5–15.5)
RDW: 12 % (ref 11.5–15.5)
WBC: 9.2 10*3/uL (ref 4.0–10.5)
WBC: 8.9 10*3/uL (ref 4.0–10.5)
nRBC: 0 % (ref 0.0–0.2)
nRBC: 0 % (ref 0.0–0.2)

## 2021-06-05 LAB — LIPID PANEL
Cholesterol: 150 mg/dL (ref 0–200)
HDL: 49 mg/dL (ref >40)
LDL Cholesterol: 91 mg/dL (ref 0–99)
Total CHOL/HDL Ratio: 3.1 RATIO
Triglycerides: 48 mg/dL (ref <150)
VLDL: 10 mg/dL (ref 0–40)

## 2021-06-05 LAB — BASIC METABOLIC PANEL
Anion gap: 5 (ref 5–15)
BUN: 18 mg/dL (ref 8–23)
CO2: 29 mmol/L (ref 22–32)
Calcium: 8.7 mg/dL — ABNORMAL LOW (ref 8.9–10.3)
Chloride: 100 mmol/L (ref 98–111)
Creatinine, Ser: 1 mg/dL (ref 0.61–1.24)
GFR, Estimated: >60 mL/min (ref >60)
Glucose, Bld: 197 mg/dL — ABNORMAL HIGH (ref 70–99)
Potassium: 3.8 mmol/L (ref 3.5–5.1)
Sodium: 134 mmol/L — ABNORMAL LOW (ref 135–145)

## 2021-06-05 LAB — TROPONIN I (HIGH SENSITIVITY)
Troponin I (High Sensitivity): >24000 ng/L (ref <18)
Troponin I (High Sensitivity): >24000 ng/L (ref <18)

## 2021-06-05 LAB — HEPARIN LEVEL (UNFRACTIONATED)
Heparin Unfractionated: 0.61 IU/mL (ref 0.30–0.70)
Heparin Unfractionated: 0.68 IU/mL (ref 0.30–0.70)

## 2021-06-05 LAB — TSH: TSH: 1.469 u[IU]/mL (ref 0.350–4.500)

## 2021-06-05 LAB — HEMOGLOBIN A1C
Hgb A1c MFr Bld: 6.1 % — ABNORMAL HIGH (ref 4.8–5.6)
Mean Plasma Glucose: 128.37 mg/dL

## 2021-06-05 LAB — T4, FREE: Free T4: 1.01 ng/dL (ref 0.61–1.12)

## 2021-06-05 MED ORDER — SODIUM CHLORIDE 0.9 % IV SOLN: 250.0000 mL | INTRAVENOUS | Status: DC | PRN

## 2021-06-05 MED ORDER — LATANOPROST 0.005 % OP SOLN
1.0000 [drp] | Freq: Every day | OPHTHALMIC | Status: DC
  Administered 2021-06-05 – 2021-06-14 (×11): 1 [drp] via OPHTHALMIC
  Filled 2021-06-05: qty 2.5

## 2021-06-05 MED ORDER — SODIUM CHLORIDE 0.9% FLUSH
3.0000 mL | Freq: Two times a day (BID) | INTRAVENOUS | Status: DC
  Administered 2021-06-05 – 2021-06-06 (×2): 3 mL via INTRAVENOUS

## 2021-06-05 MED ORDER — ATORVASTATIN CALCIUM 40 MG PO TABS
40.0000 mg | ORAL_TABLET | Freq: Every day | ORAL | Status: DC
  Administered 2021-06-05: 40 mg via ORAL
  Filled 2021-06-05: qty 1

## 2021-06-05 MED ORDER — ASPIRIN 81 MG PO CHEW
81.0000 mg | CHEWABLE_TABLET | ORAL | Status: AC
  Administered 2021-06-06: 81 mg via ORAL
  Filled 2021-06-05: qty 1

## 2021-06-05 MED ORDER — TIMOLOL MALEATE 0.5 % OP SOLN
1.0000 [drp] | Freq: Two times a day (BID) | OPHTHALMIC | Status: DC
  Administered 2021-06-05 – 2021-06-14 (×19): 1 [drp] via OPHTHALMIC
  Filled 2021-06-05 (×2): qty 5

## 2021-06-05 MED ORDER — ASPIRIN EC 81 MG PO TBEC
81.0000 mg | DELAYED_RELEASE_TABLET | Freq: Every day | ORAL | Status: DC
  Administered 2021-06-05 – 2021-06-08 (×3): 81 mg via ORAL
  Filled 2021-06-05 (×3): qty 1

## 2021-06-05 MED ORDER — BRIMONIDINE TARTRATE 0.2 % OP SOLN
1.0000 [drp] | Freq: Two times a day (BID) | OPHTHALMIC | Status: DC
  Administered 2021-06-05 – 2021-06-08 (×9): 1 [drp] via OPHTHALMIC
  Filled 2021-06-05: qty 5

## 2021-06-05 MED ORDER — ACETAMINOPHEN 325 MG PO TABS: 650.0000 mg | ORAL_TABLET | ORAL | Status: DC | PRN

## 2021-06-05 MED ORDER — SODIUM CHLORIDE 0.9 % IV SOLN: INTRAVENOUS | Status: DC

## 2021-06-05 MED ORDER — ZOLPIDEM TARTRATE 5 MG PO TABS: 5.0000 mg | ORAL_TABLET | Freq: Every evening | ORAL | Status: DC | PRN

## 2021-06-05 MED ORDER — SODIUM CHLORIDE 0.9% FLUSH: 3.0000 mL | INTRAVENOUS | Status: DC | PRN

## 2021-06-05 MED ORDER — ONDANSETRON HCL 4 MG/2ML IJ SOLN: 4.0000 mg | Freq: Four times a day (QID) | INTRAMUSCULAR | Status: DC | PRN

## 2021-06-05 NOTE — H&P (Signed)
Cardiology Admission History and Physical:   Patient ID: Adam [name] MRN: [id number]; DOB: 1940-11-24   Admission date: 06/04/2021  PCP:  Celene Squibb, MD   [facility] Providers Cardiologist:  None        Chief Complaint:  chest pain  Patient Profile:   Adam [name] is a 80 y.o. male with a PMHx of borderline diabetes, HLD, hx of CVA and nephrolithiasis who is transferred from [facility] for evaluation of chest pain.  History of Present Illness:   Adam [name] lives with his wife and remains physical active. Patient reports he was awaken last night due to mid-chest dull/achy pain without radiation (6/10 on severity) which resolved on its own when he woke up. During the daytime, he had 2-3 episodes exertional mid-chest dull/achy pain radiating to his lower back or arms which is worsened with exertion (lifting logs, walking around) and improved with rest. The pain varied in duration. He took pepto bismol and tylenol which did not alleviate his pain. He also reports associated symptoms including mild dyspnea and nausea. Reports Stable weights. Denies fever, chills, dizziness, syncope, lightheadedness, cough, heart palpitations, abdominal fullness, dysuria, diarrhea, pedal edema or any bleeding events.  Patient visits his PCP routinely. Denies history of smoking or drinking. Reports father might have some heart disease.    CXR no acute changes. CTA negative for PE or AA or AAD.  Past Medical History:  Diagnosis Date   Borderline diabetic    DIET CONTROLLED   History of kidney stones    Hyperlipemia    Paratesticular mass    PONV (postoperative nausea and vomiting)    Stroke ([place])    Right Eye / VISUAL PROBLEM RESOLVED AFTER ENDARTERECTOMY    Past Surgical History:  Procedure Laterality Date   [place]ENDECTOMY  07/1972   [place]   [facility]OTID ENDARTERECTOMY  10/03/10   [facility]   CATARACT EXTRACTION W/PHACO  01/26/2011   Procedure: CATARACT EXTRACTION PHACO AND INTRAOCULAR LENS  PLACEMENT ([place]);  Surgeon: Williams Che;  Location: [facility];  Service: Ophthalmology;  Laterality: Right;  CDE:8.64   CATARACT EXTRACTION W/PHACO Left 02/12/2014   Procedure: CATARACT EXTRACTION PHACO AND INTRAOCULAR LENS PLACEMENT ([facility]);  Surgeon: Williams Che, MD;  Location: [facility];  Service: Ophthalmology;  Laterality: Left;  CDE 6.25   CYSTOSCOPY  1970   MD   EYE SURGERY  06/30/2010   detached retina, gsbo   EYE SURGERY Left Nov 26, 2014   Detached Retina   HYDROCELE EXCISION Left 09/30/2015   Procedure: HYDROCELECTOMY ADULT;  Surgeon: Cleon Gustin, MD;  Location: [facility];  Service: Urology;  Laterality: Left;   IRRIGATION AND DEBRIDEMENT SEBACEOUS CYST  2001, 2007   [place], [place], in MD office   SKIN CANCER EXCISION  2010   pre cancerous tissue:nose, scalp, DR. Hall's office   TESTICULAR EXPLORATION Left 09/30/2015   Procedure: INGUINAL PARATESTICULAR  MASS REMOVAL;  Surgeon: Cleon Gustin, MD;  Location: [facility];  Service: Urology;  Laterality: Left;   YAG LASER APPLICATION Right 08/17/971   Procedure: YAG LASER APPLICATION;  Surgeon: Williams Che, MD;  Location: [facility];  Service: Ophthalmology;  Laterality: Right;     Medications Prior to Admission: Prior to Admission medications   Medication Sig Start Date End Date Taking? Authorizing Provider  aspirin EC 81 MG tablet Take 81 mg by mouth daily as needed (blood thinner). Swallow whole.   Yes [provider]  brimonidine (ALPHAGAN) 0.2 % ophthalmic  solution INSTILL 1 DROP INTO RIGHT EYE TWICE DAILY 09/30/20  Yes Rankin, Clent Demark, MD  Cholecalciferol (VITAMIN D3 PO) Take 1 tablet by mouth daily.   Yes [provider]  Chromium 1 MG CAPS Take 1 mg by mouth daily.   Yes [provider]  ibuprofen (ADVIL,MOTRIN) 200 MG tablet Take 200 mg by mouth every 8 (eight) hours as needed. For pain    Yes [provider]  latanoprost (XALATAN) 0.005 % ophthalmic solution Place 1 drop into both eyes at  bedtime. 05/21/21  Yes [provider]  MAGNESIUM PO Take 250 mg by mouth once a week.    Yes [provider]  timolol (TIMOPTIC) 0.5 % ophthalmic solution INSTILL 1 DROP INTO RIGHT EYE ONCE DAILY Patient taking differently: Place 1 drop into the right eye 2 (two) times daily. 09/30/20  Yes Rankin, Clent Demark, MD  VANADIUM PO Take 1 tablet by mouth daily.   Yes [provider]  aspirin 325 MG EC tablet Take 162.5 mg by mouth daily. Patient not taking: Reported on 06/04/2021    [provider]  HYDROcodone-acetaminophen (NORCO) 5-325 MG tablet Take 1 tablet by mouth every 6 (six) hours as needed for moderate pain. Patient not taking: Reported on 01/10/2016 09/30/15   Cleon Gustin, MD  niacin 500 MG tablet Take 500 mg by mouth once a week. Patient not taking: Reported on 06/04/2021    [provider]  timolol (BETIMOL) 0.5 % ophthalmic solution Place 1 drop into the right eye daily.  Patient not taking: Reported on 06/04/2021    [provider]     Allergies:   No Known Allergies  Social History:   Social History   Socioeconomic History   Marital status: Married    Spouse name: Not on file   Number of children: Not on file   Years of education: Not on file   Highest education level: Not on file  Occupational History   Not on file  Tobacco Use   Smoking status: Never   Smokeless tobacco: Never  Substance and Sexual Activity   Alcohol use: No   Drug use: No   Sexual activity: Not on file  Other Topics Concern   Not on file  Social History Narrative   Not on file   Social Determinants of Health   Financial Resource Strain: Not on file  Food Insecurity: Not on file  Transportation Needs: Not on file  Physical Activity: Not on file  Stress: Not on file  Social Connections: Not on file  Intimate Partner Violence: Not on file    Family History:   The patient's family history includes Heart disease in his father;  Hypertension in his father. There is no history of Anesthesia problems, Hypotension, Malignant hyperthermia, or Pseudochol deficiency.    ROS:  Please see the history of present illness.  All other ROS reviewed and negative.     Physical Exam/Data:   Vitals:   06/04/21 2220 06/04/21 2240 06/04/21 2350 06/04/21 2354  BP: (!) 113/56 138/75 133/72   Pulse: 79 65    Resp: 19 18 18    Temp: 98.1 F (36.7 C) 98.1 F (36.7 C) 98.8 F (37.1 C)   TempSrc: Oral Oral Tympanic   SpO2: 100% 99% 100%   Weight:    66 kg  Height:    5\' 10"  (1.778 m)    Intake/Output Summary (Last 24 hours) at 06/05/2021 0044 Last data filed at 06/05/2021 0000 Gross per  24 hour  Intake 321.58 ml  Output --  Net 321.58 ml   Last 3 Weights 06/04/2021 06/04/2021 07/20/2017  Weight (lbs) 145 lb 8 oz 160 lb 15 oz 162 lb 12.8 oz  Weight (kg) 65.998 kg 73 kg 73.846 kg     Body mass index is 20.88 kg/m.  General:  Well nourished, well developed, in no acute distress HEENT: normal Neck: no JVD Vascular: No carotid bruits; Distal pulses 2+ bilaterally   Cardiac:  normal S1, S2; RRR; no murmur  Lungs:  clear to auscultation bilaterally, no wheezing, rhonchi or rales  Abd: soft, nontender, no hepatomegaly  Ext: no edema Musculoskeletal:  No deformities, BUE and BLE strength normal and equal Skin: warm and dry  Neuro:  CNs 2-12 intact, no focal abnormalities noted Psych:  Normal affect    EKG:  The ECG that was done  was personally reviewed and demonstrates sinus rhythm  Relevant CV Studies:  Laboratory Data:  High Sensitivity Troponin:   Recent Labs  Lab 06/04/21 1812 06/04/21 2001  TROPONINIHS 2,952* 11,026*      Chemistry Recent Labs  Lab 06/04/21 1812  NA 137  K 4.1  CL 101  CO2 28  GLUCOSE 167*  BUN 24*  CREATININE 0.94  CALCIUM 9.1  GFRNONAA >60  ANIONGAP 8    No results for input(s): PROT, ALBUMIN, AST, ALT, ALKPHOS, BILITOT in the last 168 hours. Lipids No results for input(s):  CHOL, TRIG, HDL, LABVLDL, LDLCALC, CHOLHDL in the last 168 hours. Hematology Recent Labs  Lab 06/04/21 1812  WBC 8.7  RBC 4.02*  HGB 12.9*  HCT 39.9  MCV 99.3  MCH 32.1  MCHC 32.3  RDW 12.0  PLT 197   Thyroid No results for input(s): TSH, FREET4 in the last 168 hours. BNPNo results for input(s): BNP, PROBNP in the last 168 hours.  DDimer No results for input(s): DDIMER in the last 168 hours.   Radiology/Studies:  DG Chest Port 1 View  Result Date: 06/04/2021 CLINICAL DATA:  Short of breath, chest pain EXAM: PORTABLE CHEST 1 VIEW COMPARISON:  10/02/2010 FINDINGS: Single frontal view of the chest demonstrates an unremarkable cardiac silhouette. Diffuse parenchymal lung scarring is noted without airspace disease, effusion, or pneumothorax. No acute bony abnormalities. IMPRESSION: 1. No acute intrathoracic process. Electronically Signed   By: Randa Ngo M.D.   On: 06/04/2021 17:38   CT Angio Chest/Abd/Pel for Dissection W and/or Wo Contrast  Result Date: 06/04/2021 CLINICAL DATA:  Chest and bilateral arm pain, lifting injury EXAM: CT ANGIOGRAPHY CHEST, ABDOMEN AND PELVIS TECHNIQUE: Non-contrast CT of the chest was initially obtained. Multidetector CT imaging through the chest, abdomen and pelvis was performed using the standard protocol during bolus administration of intravenous contrast. Multiplanar reconstructed images and MIPs were obtained and reviewed to evaluate the vascular anatomy. CONTRAST:  168mL OMNIPAQUE IOHEXOL 350 MG/ML SOLN COMPARISON:  06/04/2021 FINDINGS: CTA CHEST FINDINGS Cardiovascular: The thoracic aorta is unremarkable without aneurysm or dissection. Mild atherosclerosis of the aorta. The heart is unremarkable without pericardial effusion. Extensive atherosclerosis of the coronary vasculature greatest in the LAD and circumflex distributions. While not optimized for evaluation of the pulmonary vasculature, there is sufficient contrast enhancement to exclude pulmonary  emboli. No filling defects. Mediastinum/Nodes: No enlarged mediastinal, hilar, or axillary lymph nodes. Thyroid gland, trachea, and esophagus demonstrate no significant findings. Lungs/Pleura: Diffuse subpleural scarring and fibrosis. No acute airspace disease, effusion, or pneumothorax. Central airways are patent. Musculoskeletal: No acute or destructive bony lesions. Reconstructed images  demonstrate no additional findings. Review of the MIP images confirms the above findings. CTA ABDOMEN AND PELVIS FINDINGS VASCULAR Aorta: Normal caliber aorta without aneurysm, dissection, vasculitis or significant stenosis. Mild atherosclerosis. Celiac: Patent without evidence of aneurysm, dissection, vasculitis or significant stenosis. Mild atherosclerosis. SMA: Patent without evidence of aneurysm, dissection, vasculitis or significant stenosis. Mild atherosclerosis. Renals: Both renal arteries are patent without evidence of aneurysm, dissection, vasculitis, fibromuscular dysplasia or significant stenosis. There is moderate atherosclerosis at the origin of the bilateral renal arteries without critical stenosis. IMA: Patent without evidence of aneurysm, dissection, vasculitis or significant stenosis. Inflow: Patent without evidence of aneurysm, dissection, vasculitis or significant stenosis. Veins: No obvious venous abnormality within the limitations of this arterial phase study. Review of the MIP images confirms the above findings. NON-VASCULAR Hepatobiliary: No focal liver abnormality is seen. No gallstones, gallbladder wall thickening, or biliary dilatation. Pancreas: Unremarkable. No pancreatic ductal dilatation or surrounding inflammatory changes. Spleen: Normal in size without focal abnormality. Adrenals/Urinary Tract: There is a nonobstructing 11 mm calculus within the lower pole right kidney. Bilateral renal cortical thinning. Benign cyst upper pole right kidney. The adrenals and bladder are unremarkable. Stomach/Bowel:  No bowel obstruction or ileus. Scattered diverticulosis throughout the colon without diverticulitis. No bowel wall thickening or inflammatory change. Lymphatic: No pathologic adenopathy. Reproductive: Prostate is unremarkable. Other: No free fluid or free gas.  No abdominal wall hernia. Musculoskeletal: No acute or destructive bony lesions. Right convex lumbar scoliosis and multilevel spondylosis. Reconstructed images demonstrate no additional findings. Review of the MIP images confirms the above findings. IMPRESSION: 1. No evidence of thoracoabdominal aortic aneurysm or dissection. Mild diffuse atherosclerosis. 2. No evidence of pulmonary embolus. 3. Nonobstructing 11 mm right renal calculus. 4. Aortic Atherosclerosis (ICD10-I70.0). Coronary artery atherosclerosis. Electronically Signed   By: Randa Ngo M.D.   On: 06/04/2021 19:57     Assessment and Plan:   #NSTEMI -atypical/typical angina, elevated troponin continues rising, EKG on presentation showed STD anterolateral leads. Currently chest pain free, on room air, no acute distress. -continue Telemetry -EKG prn -continue to trend troponin until peaks -continue ASA and high-intensity statin -continue heparin gtt and nitro gtt -patient's HR is ~60s, hold metoprolol initiation at this point -risk stratification with TSH, A1C and lipid panel -acquire a TTE am -NPO for now,   Risk Assessment/Risk Scores:    TIMI Risk Score for Unstable Angina or Non-ST Elevation MI:   The patient's TIMI risk score is 5, which indicates a 26% risk of all cause mortality, new or recurrent myocardial infarction or need for urgent revascularization in the next 14 days.   Severity of Illness: The appropriate patient status for this patient is INPATIENT. Inpatient status is judged to be reasonable and necessary in order to provide the required intensity of service to ensure the patient's safety. The patient's presenting symptoms, physical exam findings, and  initial radiographic and laboratory data in the context of their chronic comorbidities is felt to place them at high risk for further clinical deterioration. Furthermore, it is not anticipated that the patient will be medically stable for discharge from the hospital within 2 midnights of admission.   * I certify that at the point of admission it is my clinical judgment that the patient will require inpatient hospital care spanning beyond 2 midnights from the point of admission due to high intensity of service, high risk for further deterioration and high frequency of surveillance required.*   For questions or updates, please contact [place] Please consult [email or web address]  for contact info under     Signed, Laurice Record, MD  06/05/2021 12:44 AM

## 2021-06-05 NOTE — Progress Notes (Signed)
Progress Note  Patient Name: Adam [name] Date of Encounter: 06/05/2021  [facility] Cardiologist:   NEW    Subjective   Patient denies CP   Breathing is OK     Inpatient Medications    Scheduled Meds:  aspirin EC  81 mg Oral Daily   atorvastatin  40 mg Oral Daily   brimonidine  1 drop Right Eye BID   latanoprost  1 drop Both Eyes QHS   timolol  1 drop Right Eye BID   Continuous Infusions:  heparin 1,100 Units/hr (06/05/21 0000)   nitroGLYCERIN 5 mcg/min (06/05/21 0000)   PRN Meds: acetaminophen, nitroGLYCERIN, ondansetron (ZOFRAN) IV, zolpidem   Vital Signs    Vitals:   06/04/21 2220 06/04/21 2240 06/04/21 2350 06/04/21 2354  BP: (!) 113/56 138/75 133/72   Pulse: 79 65    Resp: 19 18 18    Temp: 98.1 F (36.7 C) 98.1 F (36.7 C) 98.8 F (37.1 C)   TempSrc: Oral Oral Tympanic   SpO2: 100% 99% 100%   Weight:    66 kg  Height:    5\' 10"  (1.778 m)    Intake/Output Summary (Last 24 hours) at 06/05/2021 0907 Last data filed at 06/05/2021 0000 Gross per 24 hour  Intake 321.58 ml  Output --  Net 321.58 ml   Last 3 Weights 06/04/2021 06/04/2021 07/20/2017  Weight (lbs) 145 lb 8 oz 160 lb 15 oz 162 lb 12.8 oz  Weight (kg) 65.998 kg 73 kg 73.846 kg      Telemetry     SR - Personally Reviewed  ECG     Pending  Personally Reviewed  Physical Exam   GEN: No acute distress.   Neck: No JVD Cardiac: RRR, no murmurs, Respiratory: Clear to auscultation bilaterally. GI: Soft, nontender, non-distended  MS: No edema; No deformity. Neuro:  Nonfocal  Psych: Normal affect   Labs    High Sensitivity Troponin:   Recent Labs  Lab 06/04/21 1812 06/04/21 2001 06/05/21 0100 06/05/21 0254  TROPONINIHS 2,952* 11,026* >24,000* >24,000*     Chemistry Recent Labs  Lab 06/04/21 1812 06/05/21 0254  NA 137 134*  K 4.1 3.8  CL 101 100  CO2 28 29  GLUCOSE 167* 197*  BUN 24* 18  CREATININE 0.94 1.00  CALCIUM 9.1 8.7*  GFRNONAA >60 >60  ANIONGAP 8 5     Lipids  Recent Labs  Lab 06/05/21 0100  CHOL 150  TRIG 48  HDL 49  LDLCALC 91  CHOLHDL 3.1    Hematology Recent Labs  Lab 06/04/21 1812 06/05/21 0254  WBC 8.7 9.2  RBC 4.02* 3.56*  HGB 12.9* 11.4*  HCT 39.9 33.8*  MCV 99.3 94.9  MCH 32.1 32.0  MCHC 32.3 33.7  RDW 12.0 12.0  PLT 197 178   Thyroid  Recent Labs  Lab 06/05/21 0100  TSH 1.469  FREET4 1.01    BNPNo results for input(s): BNP, PROBNP in the last 168 hours.  DDimer No results for input(s): DDIMER in the last 168 hours.   Radiology    DG Chest Port 1 View  Result Date: 06/04/2021 CLINICAL DATA:  Short of breath, chest pain EXAM: PORTABLE CHEST 1 VIEW COMPARISON:  10/02/2010 FINDINGS: Single frontal view of the chest demonstrates an unremarkable cardiac silhouette. Diffuse parenchymal lung scarring is noted without airspace disease, effusion, or pneumothorax. No acute bony abnormalities. IMPRESSION: 1. No acute intrathoracic process. Electronically Signed   By: Randa Ngo M.D.   On: 06/04/2021  17:38   CT Angio Chest/Abd/Pel for Dissection W and/or Wo Contrast  Result Date: 06/04/2021 CLINICAL DATA:  Chest and bilateral arm pain, lifting injury EXAM: CT ANGIOGRAPHY CHEST, ABDOMEN AND PELVIS TECHNIQUE: Non-contrast CT of the chest was initially obtained. Multidetector CT imaging through the chest, abdomen and pelvis was performed using the standard protocol during bolus administration of intravenous contrast. Multiplanar reconstructed images and MIPs were obtained and reviewed to evaluate the vascular anatomy. CONTRAST:  178mL OMNIPAQUE IOHEXOL 350 MG/ML SOLN COMPARISON:  06/04/2021 FINDINGS: CTA CHEST FINDINGS Cardiovascular: The thoracic aorta is unremarkable without aneurysm or dissection. Mild atherosclerosis of the aorta. The heart is unremarkable without pericardial effusion. Extensive atherosclerosis of the coronary vasculature greatest in the LAD and circumflex distributions. While not optimized for  evaluation of the pulmonary vasculature, there is sufficient contrast enhancement to exclude pulmonary emboli. No filling defects. Mediastinum/Nodes: No enlarged mediastinal, hilar, or axillary lymph nodes. Thyroid gland, trachea, and esophagus demonstrate no significant findings. Lungs/Pleura: Diffuse subpleural scarring and fibrosis. No acute airspace disease, effusion, or pneumothorax. Central airways are patent. Musculoskeletal: No acute or destructive bony lesions. Reconstructed images demonstrate no additional findings. Review of the MIP images confirms the above findings. CTA ABDOMEN AND PELVIS FINDINGS VASCULAR Aorta: Normal caliber aorta without aneurysm, dissection, vasculitis or significant stenosis. Mild atherosclerosis. Celiac: Patent without evidence of aneurysm, dissection, vasculitis or significant stenosis. Mild atherosclerosis. SMA: Patent without evidence of aneurysm, dissection, vasculitis or significant stenosis. Mild atherosclerosis. Renals: Both renal arteries are patent without evidence of aneurysm, dissection, vasculitis, fibromuscular dysplasia or significant stenosis. There is moderate atherosclerosis at the origin of the bilateral renal arteries without critical stenosis. IMA: Patent without evidence of aneurysm, dissection, vasculitis or significant stenosis. Inflow: Patent without evidence of aneurysm, dissection, vasculitis or significant stenosis. Veins: No obvious venous abnormality within the limitations of this arterial phase study. Review of the MIP images confirms the above findings. NON-VASCULAR Hepatobiliary: No focal liver abnormality is seen. No gallstones, gallbladder wall thickening, or biliary dilatation. Pancreas: Unremarkable. No pancreatic ductal dilatation or surrounding inflammatory changes. Spleen: Normal in size without focal abnormality. Adrenals/Urinary Tract: There is a nonobstructing 11 mm calculus within the lower pole right kidney. Bilateral renal cortical  thinning. Benign cyst upper pole right kidney. The adrenals and bladder are unremarkable. Stomach/Bowel: No bowel obstruction or ileus. Scattered diverticulosis throughout the colon without diverticulitis. No bowel wall thickening or inflammatory change. Lymphatic: No pathologic adenopathy. Reproductive: Prostate is unremarkable. Other: No free fluid or free gas.  No abdominal wall hernia. Musculoskeletal: No acute or destructive bony lesions. Right convex lumbar scoliosis and multilevel spondylosis. Reconstructed images demonstrate no additional findings. Review of the MIP images confirms the above findings. IMPRESSION: 1. No evidence of thoracoabdominal aortic aneurysm or dissection. Mild diffuse atherosclerosis. 2. No evidence of pulmonary embolus. 3. Nonobstructing 11 mm right renal calculus. 4. Aortic Atherosclerosis (ICD10-I70.0). Coronary artery atherosclerosis. Electronically Signed   By: Randa Ngo M.D.   On: 06/04/2021 19:57    Cardiac Studies   Echo ordered    Patient Profile     NAVARRE DIANA is a 80 y.o. male with a PMHx of borderline diabetes, HLD, hx of CVA and nephrolithiasis who is transferred from [facility] for evaluation of chest pain.  Assessment & Plan    1 NSTEMI  Patient had onset of CP at 4AM yesteday   R arm then chest +L arm   Went away   Came back   Son brought to ER  Troponin has not peaked yet, >24,000.    Would continue to track    Keep on heparin and NTG Echo ordered  Plan for cath tomorrow   Risks/ benefits described   Pt understands and agrees to proceed.    2  HL   It does not appear that he was on a statin prior to admit   On Lipitor now      Will need to be folowed  3  Hx CV dz  s/p R CEA 2012.    4   Hx glaucoma  For questions or updates, please contact [facility] Please consult [email or web address] for contact info under        Signed, Dorris Carnes, MD  06/05/2021, 9:07 AM

## 2021-06-05 NOTE — Progress Notes (Signed)
ANTICOAGULATION CONSULT NOTE  Pharmacy Consult for heparin Indication: chest pain/ACS  No Known Allergies  Patient Measurements: Height: 5\' 10"  (177.8 cm) Weight: 66 kg (145 lb 8 oz) IBW/kg (Calculated) : 73 Heparin Dosing Weight: 73kg  Vital Signs: Temp: 98.8 F (37.1 C) (11/23 2350) Temp Source: Tympanic (11/23 2350) BP: 133/72 (11/23 2350) Pulse Rate: 65 (11/23 2240)  Labs: Recent Labs    06/04/21 1812 06/04/21 2001 06/05/21 0100 06/05/21 0254  HGB 12.9*  --   --  11.4*  HCT 39.9  --   --  33.8*  PLT 197  --   --  178  CREATININE 0.94  --   --  1.00  TROPONINIHS 2,952* 11,026* >24,000* >24,000*     Estimated Creatinine Clearance: 56.8 mL/min (by C-G formula based on SCr of 1 mg/dL).   Assessment: Pt with a hx of CVA presented with new CP. Heparin has been ordered for NSTEMI. CT didn't show dissection or PE  Heparin level therapeutic (0.61) 11/24 0505 on infusion at 1100 units/hr - for some reason this lab is not crossing over into Epic and lab is unsure why. No issues with line or bleeding reported per RN.  Goal of Therapy:  Heparin level 0.3-0.7 units/ml Monitor platelets by anticoagulation protocol: Yes   Plan:  Continue heparin infusion 1100 units/hr Will f/u 6h confirmatory heparin level  Sherlon Handing, PharmD, BCPS Please see amion for complete clinical pharmacist phone list 06/05/2021 5:35 AM

## 2021-06-05 NOTE — Progress Notes (Signed)
Critical Lab value :Troponin greater than twenty-four thousand (24,000)

## 2021-06-05 NOTE — Progress Notes (Addendum)
ANTICOAGULATION CONSULT NOTE - Follow Up Consult  Pharmacy Consult for Heparin Indication: chest pain/ACS  No Known Allergies  Patient Measurements: Height: 5\' 10"  (177.8 cm) Weight: 66 kg (145 lb 8 oz) IBW/kg (Calculated) : 73 Heparin Dosing Weight: 66 kg  Vital Signs: Pulse Rate: 60 (11/24 1138)  Labs: Recent Labs    06/04/21 1812 06/04/21 2001 06/05/21 0100 06/05/21 0254 06/05/21 1215  HGB 12.9*  --   --  11.4*  --   HCT 39.9  --   --  33.8*  --   PLT 197  --   --  178  --   HEPARINUNFRC  --   --   --   --  0.68  CREATININE 0.94  --   --  1.00  --   TROPONINIHS 2,952* 11,026* >24,000* >24,000*  --     Estimated Creatinine Clearance: 56.8 mL/min (by C-G formula based on SCr of 1 mg/dL).   Medications:  Scheduled:   aspirin EC  81 mg Oral Daily   atorvastatin  40 mg Oral Daily   brimonidine  1 drop Right Eye BID   latanoprost  1 drop Both Eyes QHS   timolol  1 drop Right Eye BID   Infusions:   heparin 1,100 Units/hr (06/05/21 0000)   nitroGLYCERIN 5 mcg/min (06/05/21 0000)    Assessment: 80 yo M with a hx of CVA presented with new CP. Patient was not on anticoagulation PTA. Pharmacy consulted to dose heparin for NSTEMI. CT did not show dissection or PE.  Heparin level therapeutic (0.61) 11/24 0505 on infusion at 1100 units/hr - for some reason this lab is not crossing over into Epic and lab is unsure why.   Heparin level today therapeutic at 0.68, on 1100 units/hr. CBC stable. No line issues or signs/symptoms of bleeding noted per RN.  Plans noted for cath tomorrow.   Goal of Therapy:  Heparin level 0.3-0.7 units/ml Monitor platelets by anticoagulation protocol: Yes   Plan:  Continue heparin at 1100 units/hr Daily CBC and heparin level Monitor for signs/symptoms of bleeding    Vance Peper, PharmD PGY1 Pharmacy Resident Phone [phone] 06/05/2021 1:11 PM   Please check AMION for all [place] phone numbers After 10:00 PM, call [place]  [phone]

## 2021-06-06 ENCOUNTER — Inpatient Hospital Stay (HOSPITAL_COMMUNITY): Payer: Medicare HMO

## 2021-06-06 ENCOUNTER — Encounter (HOSPITAL_COMMUNITY)
Admission: EM | Disposition: A | Discharge status: Home / Self Care | Payer: Self-pay | Source: Home / Self Care | Attending: Thoracic Surgery (Cardiothoracic Vascular Surgery)

## 2021-06-06 ENCOUNTER — Encounter (HOSPITAL_COMMUNITY): Payer: Self-pay | Admitting: Cardiovascular Disease

## 2021-06-06 DIAGNOSIS — I214 Non-ST elevation (NSTEMI) myocardial infarction: Secondary | ICD-10-CM | POA: Diagnosis not present

## 2021-06-06 DIAGNOSIS — I2510 Atherosclerotic heart disease of native coronary artery without angina pectoris: Secondary | ICD-10-CM | POA: Diagnosis not present

## 2021-06-06 HISTORY — PX: LEFT HEART CATH AND CORONARY ANGIOGRAPHY: CATH118249

## 2021-06-06 LAB — CBC
HCT: 33.5 % — ABNORMAL LOW (ref 39.0–52.0)
Hemoglobin: 10.9 g/dL — ABNORMAL LOW (ref 13.0–17.0)
MCH: 31.5 pg (ref 26.0–34.0)
MCHC: 32.5 g/dL (ref 30.0–36.0)
MCV: 96.8 fL (ref 80.0–100.0)
Platelets: 157 10*3/uL (ref 150–400)
RBC: 3.46 MIL/uL — ABNORMAL LOW (ref 4.22–5.81)
RDW: 12.2 % (ref 11.5–15.5)
WBC: 7.4 10*3/uL (ref 4.0–10.5)
nRBC: 0 % (ref 0.0–0.2)

## 2021-06-06 LAB — HEPARIN LEVEL (UNFRACTIONATED): Heparin Unfractionated: 0.65 IU/mL (ref 0.30–0.70)

## 2021-06-06 SURGERY — LEFT HEART CATH AND CORONARY ANGIOGRAPHY
Anesthesia: LOCAL

## 2021-06-06 MED ORDER — SODIUM CHLORIDE 0.9 % IV SOLN: 250.0000 mL | INTRAVENOUS | Status: DC | PRN

## 2021-06-06 MED ORDER — SODIUM CHLORIDE 0.9% FLUSH
3.0000 mL | Freq: Two times a day (BID) | INTRAVENOUS | Status: DC
  Administered 2021-06-06 – 2021-06-08 (×5): 3 mL via INTRAVENOUS

## 2021-06-06 MED ORDER — VERAPAMIL HCL 2.5 MG/ML IV SOLN
INTRAVENOUS | Status: DC | PRN
  Administered 2021-06-06: 10 mL via INTRA_ARTERIAL

## 2021-06-06 MED ORDER — MIDAZOLAM HCL 2 MG/2ML IJ SOLN
INTRAMUSCULAR | Status: DC | PRN
  Administered 2021-06-06: 2 mg via INTRAVENOUS

## 2021-06-06 MED ORDER — HEPARIN (PORCINE) 25000 UT/250ML-% IV SOLN
1100.0000 [IU]/h | INTRAVENOUS | Status: DC
  Administered 2021-06-06: 22:00:00 1050 [IU]/h via INTRAVENOUS
  Administered 2021-06-07 – 2021-06-08 (×2): 1100 [IU]/h via INTRAVENOUS
  Filled 2021-06-06 (×3): qty 250

## 2021-06-06 MED ORDER — HEPARIN (PORCINE) IN NACL 1000-0.9 UT/500ML-% IV SOLN
INTRAVENOUS | Status: AC
  Filled 2021-06-06: qty 1000

## 2021-06-06 MED ORDER — ATORVASTATIN CALCIUM 80 MG PO TABS
80.0000 mg | ORAL_TABLET | Freq: Every day | ORAL | Status: DC
  Administered 2021-06-06 – 2021-06-08 (×3): 80 mg via ORAL
  Filled 2021-06-06 (×3): qty 1

## 2021-06-06 MED ORDER — LABETALOL HCL 5 MG/ML IV SOLN: 10.0000 mg | INTRAVENOUS | Status: AC | PRN

## 2021-06-06 MED ORDER — HYDRALAZINE HCL 20 MG/ML IJ SOLN: 10.0000 mg | INTRAMUSCULAR | Status: AC | PRN

## 2021-06-06 MED ORDER — LIDOCAINE HCL (PF) 1 % IJ SOLN
INTRAMUSCULAR | Status: AC
  Filled 2021-06-06: qty 30

## 2021-06-06 MED ORDER — IOHEXOL 350 MG/ML SOLN
INTRAVENOUS | Status: DC | PRN
  Administered 2021-06-06: 50 mL

## 2021-06-06 MED ORDER — MIDAZOLAM HCL 2 MG/2ML IJ SOLN
INTRAMUSCULAR | Status: AC
  Filled 2021-06-06: qty 2

## 2021-06-06 MED ORDER — VERAPAMIL HCL 2.5 MG/ML IV SOLN
INTRAVENOUS | Status: AC
  Filled 2021-06-06: qty 2

## 2021-06-06 MED ORDER — FENTANYL CITRATE (PF) 100 MCG/2ML IJ SOLN
INTRAMUSCULAR | Status: AC
  Filled 2021-06-06: qty 2

## 2021-06-06 MED ORDER — CARVEDILOL 3.125 MG PO TABS
3.1250 mg | ORAL_TABLET | Freq: Two times a day (BID) | ORAL | Status: DC
  Administered 2021-06-06 – 2021-06-08 (×6): 3.125 mg via ORAL
  Filled 2021-06-06 (×6): qty 1

## 2021-06-06 MED ORDER — SODIUM CHLORIDE 0.9 % WEIGHT BASED INFUSION
1.0000 mL/kg/h | INTRAVENOUS | Status: AC
  Administered 2021-06-06: 1 mL/kg/h via INTRAVENOUS

## 2021-06-06 MED ORDER — SODIUM CHLORIDE 0.9% FLUSH: 3.0000 mL | INTRAVENOUS | Status: DC | PRN

## 2021-06-06 MED ORDER — LIDOCAINE HCL (PF) 1 % IJ SOLN
INTRAMUSCULAR | Status: DC | PRN
  Administered 2021-06-06: 2 mL

## 2021-06-06 MED ORDER — HEPARIN SODIUM (PORCINE) 1000 UNIT/ML IJ SOLN
INTRAMUSCULAR | Status: DC | PRN
  Administered 2021-06-06: 4000 [IU] via INTRAVENOUS

## 2021-06-06 MED ORDER — FENTANYL CITRATE (PF) 100 MCG/2ML IJ SOLN
INTRAMUSCULAR | Status: DC | PRN
  Administered 2021-06-06: 50 ug via INTRAVENOUS

## 2021-06-06 MED ORDER — HEPARIN SODIUM (PORCINE) 1000 UNIT/ML IJ SOLN
INTRAMUSCULAR | Status: AC
  Filled 2021-06-06: qty 10

## 2021-06-06 MED ORDER — HEPARIN (PORCINE) IN NACL 1000-0.9 UT/500ML-% IV SOLN
INTRAVENOUS | Status: DC | PRN
  Administered 2021-06-06 (×2): 500 mL

## 2021-06-06 SURGICAL SUPPLY — 10 items
CATH 5FR JL3.5 JR4 ANG PIG MP (CATHETERS) ×3 IMPLANT
DEVICE RAD COMP TR BAND LRG (VASCULAR PRODUCTS) ×3 IMPLANT
GLIDESHEATH SLEND SS 6F .021 (SHEATH) ×3 IMPLANT
GUIDEWIRE INQWIRE 1.5J.035X260 (WIRE) ×1 IMPLANT
INQWIRE 1.5J .035X260CM (WIRE) ×3
KIT ENCORE 26 ADVANTAGE (KITS) IMPLANT
KIT HEART LEFT (KITS) ×3 IMPLANT
PACK CARDIAC CATHETERIZATION (CUSTOM PROCEDURE TRAY) ×3 IMPLANT
TRANSDUCER W/STOPCOCK (MISCELLANEOUS) ×3 IMPLANT
TUBING CIL FLEX 10 FLL-RA (TUBING) ×3 IMPLANT

## 2021-06-06 NOTE — Interval H&P Note (Signed)
Cath Lab Visit (complete for each Cath Lab visit)  Clinical Evaluation Leading to the Procedure:   ACS: Yes.    Non-ACS:    Anginal Classification: CCS IV  Anti-ischemic medical therapy: No Therapy  Non-Invasive Test Results: No non-invasive testing performed  Prior CABG: No previous CABG      History and Physical Interval Note:  06/06/2021 12:35 PM  Adam [name]  has presented today for surgery, with the diagnosis of NSTEMI.  The various methods of treatment have been discussed with the patient and family. After consideration of risks, benefits and other options for treatment, the patient has consented to  Procedure(s): LEFT HEART CATH AND CORONARY ANGIOGRAPHY (N/A) as a surgical intervention.  The patient's history has been reviewed, patient examined, no change in status, stable for surgery.  I have reviewed the patient's chart and labs.  Questions were answered to the patient's satisfaction.     Sherren Mocha

## 2021-06-06 NOTE — Progress Notes (Signed)
ANTICOAGULATION CONSULT NOTE - Follow Up Consult  Pharmacy Consult for Heparin Indication: chest pain/ACS  No Known Allergies  Patient Measurements: Height: 5\' 10"  (177.8 cm) Weight: 66 kg (145 lb 8 oz) IBW/kg (Calculated) : 73 Heparin Dosing Weight: 66 kg  Vital Signs: Temp: 98.5 F (36.9 C) (11/25 0535) Temp Source: Oral (11/25 0535) BP: 118/74 (11/25 0535) Pulse Rate: 87 (11/25 0535)  Labs: Recent Labs    06/04/21 1812 06/04/21 2001 06/05/21 0100 06/05/21 0254 06/05/21 0505 06/05/21 1215 06/06/21 0226  HGB 12.9*  --   --  11.4* 11.4*  --  10.9*  HCT 39.9  --   --  33.8* 34.4*  --  33.5*  PLT 197  --   --  178 162  --  157  HEPARINUNFRC  --   --   --   --  0.61 0.68 0.65  CREATININE 0.94  --   --  1.00  --   --   --   TROPONINIHS 2,952* 11,026* >24,000* >24,000*  --   --   --      Estimated Creatinine Clearance: 56.8 mL/min (by C-G formula based on SCr of 1 mg/dL).   Medications:  Scheduled:   aspirin EC  81 mg Oral Daily   atorvastatin  80 mg Oral Daily   brimonidine  1 drop Right Eye BID   carvedilol  3.125 mg Oral BID WC   latanoprost  1 drop Both Eyes QHS   sodium chloride flush  3 mL Intravenous Q12H   timolol  1 drop Right Eye BID   Infusions:   sodium chloride     sodium chloride 70 mL/hr at 06/06/21 0634   heparin 1,100 Units/hr (06/05/21 1606)   nitroGLYCERIN 5 mcg/min (06/05/21 0000)    Assessment: 80 yo M with a hx of CVA presented with new CP. Patient was not on anticoagulation PTA. Pharmacy consulted to dose heparin for NSTEMI. CT did not show dissection or PE.  Heparin level therapeutic (0.61) 11/24 0505 on infusion at 1100 units/hr - for some reason this lab is not crossing over into Epic and lab is unsure why.   Heparin level today remains therapeutic at 0.65, on 1100 units/hr. CBC stable. No line issues or signs/symptoms of bleeding noted per RN.  Planning cath today.   Goal of Therapy:  Heparin level 0.3-0.7 units/ml Monitor  platelets by anticoagulation protocol: Yes   Plan:  Continue heparin at 1100 units/hr Daily CBC and heparin level Monitor for signs/symptoms of bleeding  F/u after cath.  [place] Crane, Roylene Reason, BCCP Clinical Pharmacist  06/06/2021 8:03 AM   [facility] pharmacy phone numbers are listed on [email or web address]

## 2021-06-06 NOTE — Progress Notes (Signed)
ANTICOAGULATION CONSULT NOTE - Follow Up Consult  Pharmacy Consult for Heparin Indication: chest pain/ACS  No Known Allergies  Patient Measurements: Height: 5\' 10"  (177.8 cm) Weight: 66 kg (145 lb 8 oz) IBW/kg (Calculated) : 73 Heparin Dosing Weight: 66 kg  Vital Signs: Temp: 97.7 F (36.5 C) (11/25 1320) Temp Source: Oral (11/25 1320) BP: 109/62 (11/25 1320) Pulse Rate: 54 (11/25 1320)  Labs: Recent Labs    06/04/21 1812 06/04/21 2001 06/05/21 0100 06/05/21 0254 06/05/21 0505 06/05/21 1215 06/06/21 0226  HGB 12.9*  --   --  11.4* 11.4*  --  10.9*  HCT 39.9  --   --  33.8* 34.4*  --  33.5*  PLT 197  --   --  178 162  --  157  HEPARINUNFRC  --   --   --   --  0.61 0.68 0.65  CREATININE 0.94  --   --  1.00  --   --   --   TROPONINIHS 2,952* 11,026* >24,000* >24,000*  --   --   --      Estimated Creatinine Clearance: 56.8 mL/min (by C-G formula based on SCr of 1 mg/dL).   Medications:  Scheduled:   aspirin EC  81 mg Oral Daily   atorvastatin  80 mg Oral Daily   brimonidine  1 drop Right Eye BID   carvedilol  3.125 mg Oral BID WC   latanoprost  1 drop Both Eyes QHS   sodium chloride flush  3 mL Intravenous Q12H   timolol  1 drop Right Eye BID   Infusions:   sodium chloride     sodium chloride 1 mL/kg/hr (06/06/21 1342)   heparin 1,100 Units/hr (06/06/21 1146)   nitroGLYCERIN Stopped (06/06/21 1244)    Assessment: 80 yo M with a hx of CVA presented with new CP. Patient was not on anticoagulation PTA. Pharmacy consulted to dose heparin for NSTEMI. CT did not show dissection or PE.  Heparin level therapeutic (0.61) 11/24 0505 on infusion at 1100 units/hr - for some reason this lab is not crossing over into Epic and lab is unsure why.   Heparin level today remains therapeutic at 0.65, on 1100 units/hr. CBC stable. No line issues or signs/symptoms of bleeding noted per RN.  Cath today with multivessel dx > TCTS consult pending.  Pharmacy asked to resume  heparin 4 hrs after TR band removal.    Goal of Therapy:  Heparin level 0.3-0.7 units/ml Monitor platelets by anticoagulation protocol: Yes   Plan:  Resume IV heparin at 1050 units/hr 4 hrs after band off. Check heparin level 8 hrs after gtt resumes. Daily CBC and heparin level Monitor for signs/symptoms of bleeding  F/u TCTS plans.  [place] Crane, Roylene Reason, BCCP Clinical Pharmacist  06/06/2021 2:06 PM   [facility] pharmacy phone numbers are listed on [email or web address]

## 2021-06-06 NOTE — H&P (View-Only) (Signed)
Progress Note  Patient Name: Adam [name] Date of Encounter: 06/06/2021  [facility] Cardiologist: New  Subjective   Patient denies chest pain or SOB. Cath discussed.   Inpatient Medications    Scheduled Meds:  aspirin EC  81 mg Oral Daily   atorvastatin  40 mg Oral Daily   brimonidine  1 drop Right Eye BID   latanoprost  1 drop Both Eyes QHS   sodium chloride flush  3 mL Intravenous Q12H   timolol  1 drop Right Eye BID   Continuous Infusions:  sodium chloride     sodium chloride 70 mL/hr at 06/06/21 0634   heparin 1,100 Units/hr (06/05/21 1606)   nitroGLYCERIN 5 mcg/min (06/05/21 0000)   PRN Meds: sodium chloride, acetaminophen, nitroGLYCERIN, ondansetron (ZOFRAN) IV, sodium chloride flush, zolpidem   Vital Signs    Vitals:   06/05/21 1138 06/05/21 1537 06/05/21 2058 06/06/21 0535  BP:  (!) 98/39 (!) 120/55 118/74  Pulse: 60 61 80 87  Resp:  18 18 15   Temp:  98.6 F (37 C) 98.3 F (36.8 C) 98.5 F (36.9 C)  TempSrc:  Oral Oral Oral  SpO2: 99% 100% 98% 98%  Weight:      Height:        Intake/Output Summary (Last 24 hours) at 06/06/2021 0745 Last data filed at 06/05/2021 1700 Gross per 24 hour  Intake 482.87 ml  Output --  Net 482.87 ml   Last 3 Weights 06/04/2021 06/04/2021 07/20/2017  Weight (lbs) 145 lb 8 oz 160 lb 15 oz 162 lb 12.8 oz  Weight (kg) 65.998 kg 73 kg 73.846 kg      Telemetry    NSR HR 60s, PVCs - Personally Reviewed  ECG    NSR, 67bpm, TWI aVF, III - Personally Reviewed  Physical Exam   GEN: No acute distress.   Neck: No JVD Cardiac: RRR, no murmurs, rubs, or gallops.  Respiratory: Clear to auscultation bilaterally. GI: Soft, nontender, non-distended  MS: No edema; No deformity. Neuro:  Nonfocal  Psych: Normal affect   Labs    High Sensitivity Troponin:   Recent Labs  Lab 06/04/21 1812 06/04/21 2001 06/05/21 0100 06/05/21 0254  TROPONINIHS 2,952* 11,026* >24,000* >24,000*     Chemistry Recent Labs  Lab  06/04/21 1812 06/05/21 0254  NA 137 134*  K 4.1 3.8  CL 101 100  CO2 28 29  GLUCOSE 167* 197*  BUN 24* 18  CREATININE 0.94 1.00  CALCIUM 9.1 8.7*  GFRNONAA >60 >60  ANIONGAP 8 5    Lipids  Recent Labs  Lab 06/05/21 0100  CHOL 150  TRIG 48  HDL 49  LDLCALC 91  CHOLHDL 3.1    Hematology Recent Labs  Lab 06/05/21 0254 06/05/21 0505 06/06/21 0226  WBC 9.2 8.9 7.4  RBC 3.56* 3.63* 3.46*  HGB 11.4* 11.4* 10.9*  HCT 33.8* 34.4* 33.5*  MCV 94.9 94.8 96.8  MCH 32.0 31.4 31.5  MCHC 33.7 33.1 32.5  RDW 12.0 12.0 12.2  PLT 178 162 157   Thyroid  Recent Labs  Lab 06/05/21 0100  TSH 1.469  FREET4 1.01    BNPNo results for input(s): BNP, PROBNP in the last 168 hours.  DDimer No results for input(s): DDIMER in the last 168 hours.   Radiology    DG Chest Port 1 View  Result Date: 06/04/2021 CLINICAL DATA:  Short of breath, chest pain EXAM: PORTABLE CHEST 1 VIEW COMPARISON:  10/02/2010 FINDINGS: Single frontal view of the  chest demonstrates an unremarkable cardiac silhouette. Diffuse parenchymal lung scarring is noted without airspace disease, effusion, or pneumothorax. No acute bony abnormalities. IMPRESSION: 1. No acute intrathoracic process. Electronically Signed   By: Randa Ngo M.D.   On: 06/04/2021 17:38   CT Angio Chest/Abd/Pel for Dissection W and/or Wo Contrast  Result Date: 06/04/2021 CLINICAL DATA:  Chest and bilateral arm pain, lifting injury EXAM: CT ANGIOGRAPHY CHEST, ABDOMEN AND PELVIS TECHNIQUE: Non-contrast CT of the chest was initially obtained. Multidetector CT imaging through the chest, abdomen and pelvis was performed using the standard protocol during bolus administration of intravenous contrast. Multiplanar reconstructed images and MIPs were obtained and reviewed to evaluate the vascular anatomy. CONTRAST:  164mL OMNIPAQUE IOHEXOL 350 MG/ML SOLN COMPARISON:  06/04/2021 FINDINGS: CTA CHEST FINDINGS Cardiovascular: The thoracic aorta is unremarkable  without aneurysm or dissection. Mild atherosclerosis of the aorta. The heart is unremarkable without pericardial effusion. Extensive atherosclerosis of the coronary vasculature greatest in the LAD and circumflex distributions. While not optimized for evaluation of the pulmonary vasculature, there is sufficient contrast enhancement to exclude pulmonary emboli. No filling defects. Mediastinum/Nodes: No enlarged mediastinal, hilar, or axillary lymph nodes. Thyroid gland, trachea, and esophagus demonstrate no significant findings. Lungs/Pleura: Diffuse subpleural scarring and fibrosis. No acute airspace disease, effusion, or pneumothorax. Central airways are patent. Musculoskeletal: No acute or destructive bony lesions. Reconstructed images demonstrate no additional findings. Review of the MIP images confirms the above findings. CTA ABDOMEN AND PELVIS FINDINGS VASCULAR Aorta: Normal caliber aorta without aneurysm, dissection, vasculitis or significant stenosis. Mild atherosclerosis. Celiac: Patent without evidence of aneurysm, dissection, vasculitis or significant stenosis. Mild atherosclerosis. SMA: Patent without evidence of aneurysm, dissection, vasculitis or significant stenosis. Mild atherosclerosis. Renals: Both renal arteries are patent without evidence of aneurysm, dissection, vasculitis, fibromuscular dysplasia or significant stenosis. There is moderate atherosclerosis at the origin of the bilateral renal arteries without critical stenosis. IMA: Patent without evidence of aneurysm, dissection, vasculitis or significant stenosis. Inflow: Patent without evidence of aneurysm, dissection, vasculitis or significant stenosis. Veins: No obvious venous abnormality within the limitations of this arterial phase study. Review of the MIP images confirms the above findings. NON-VASCULAR Hepatobiliary: No focal liver abnormality is seen. No gallstones, gallbladder wall thickening, or biliary dilatation. Pancreas:  Unremarkable. No pancreatic ductal dilatation or surrounding inflammatory changes. Spleen: Normal in size without focal abnormality. Adrenals/Urinary Tract: There is a nonobstructing 11 mm calculus within the lower pole right kidney. Bilateral renal cortical thinning. Benign cyst upper pole right kidney. The adrenals and bladder are unremarkable. Stomach/Bowel: No bowel obstruction or ileus. Scattered diverticulosis throughout the colon without diverticulitis. No bowel wall thickening or inflammatory change. Lymphatic: No pathologic adenopathy. Reproductive: Prostate is unremarkable. Other: No free fluid or free gas.  No abdominal wall hernia. Musculoskeletal: No acute or destructive bony lesions. Right convex lumbar scoliosis and multilevel spondylosis. Reconstructed images demonstrate no additional findings. Review of the MIP images confirms the above findings. IMPRESSION: 1. No evidence of thoracoabdominal aortic aneurysm or dissection. Mild diffuse atherosclerosis. 2. No evidence of pulmonary embolus. 3. Nonobstructing 11 mm right renal calculus. 4. Aortic Atherosclerosis (ICD10-I70.0). Coronary artery atherosclerosis. Electronically Signed   By: Randa Ngo M.D.   On: 06/04/2021 19:57    Cardiac Studies   Echo ordered  Patient Profile     80 y.o. male with pmh of borderline diabetes, HLD, h/o CVA, nephrolithiasis who was transferred from [facility] for chest pain.   Assessment & Plan    NSTEMI - presented with chest pain radiating  to arm transferred to [facility] from [place] >24,000 - continue IV heparin - Nitro gtt - Echo ordered - continue ASA, statin - BB not added for low HR>>may tolerate low dose Coreg - No further chest pain - plan for cath today Risks and benefits of cardiac catheterization have been discussed with the patient.  These include bleeding, infection, kidney damage, stroke, heart attack, death.  The patient understands these risks and is willing to proceed.   HLD -  LDL 91, goal less than 70 - continue statin>>will increase to Lipitor 80mg  daily  H/o CVA s/p RCEA 2012 - continue ASA  Prediabetes - A1C 6.1 - needs to follow with PCP as OP  For questions or updates, please contact [facility] Please consult [email or web address] for contact info under        Signed, Cadence Ninfa Meeker, PA-C  06/06/2021, 7:45 AM     I have personally seen and examined this patient. I agree with the assessment and plan as outlined above.  Mr. Milliman is a 80 yo male with DM, HLD transferred to [facility] with a NSTEMI, troponin over 24,000. No ischemic changes on EKG this am. No chest pain this am.  Cardiac cath is indicated. Plans for cath later this am.  Labs reviewed and ok for cath.  Continue IV heparin.  NPO for cath  I have reviewed the risks, indications, and alternatives to cardiac catheterization, possible angioplasty, and stenting with the patient. Risks include but are not limited to bleeding, infection, vascular injury, stroke, myocardial infection, arrhythmia, kidney injury, radiation-related injury in the case of prolonged fluoroscopy use, emergency cardiac surgery, and death. The patient understands the risks of serious complication is 1-2 in [id number] with diagnostic cardiac cath and 1-2% or less with angioplasty/stenting.   Lauree Chandler 06/06/2021 11:16 AM

## 2021-06-06 NOTE — Progress Notes (Addendum)
Progress Note  Patient Name: Adam [name] Date of Encounter: 06/06/2021  [facility] Cardiologist: New  Subjective   Patient denies chest pain or SOB. Cath discussed.   Inpatient Medications    Scheduled Meds:  aspirin EC  81 mg Oral Daily   atorvastatin  40 mg Oral Daily   brimonidine  1 drop Right Eye BID   latanoprost  1 drop Both Eyes QHS   sodium chloride flush  3 mL Intravenous Q12H   timolol  1 drop Right Eye BID   Continuous Infusions:  sodium chloride     sodium chloride 70 mL/hr at 06/06/21 0634   heparin 1,100 Units/hr (06/05/21 1606)   nitroGLYCERIN 5 mcg/min (06/05/21 0000)   PRN Meds: sodium chloride, acetaminophen, nitroGLYCERIN, ondansetron (ZOFRAN) IV, sodium chloride flush, zolpidem   Vital Signs    Vitals:   06/05/21 1138 06/05/21 1537 06/05/21 2058 06/06/21 0535  BP:  (!) 98/39 (!) 120/55 118/74  Pulse: 60 61 80 87  Resp:  18 18 15   Temp:  98.6 F (37 C) 98.3 F (36.8 C) 98.5 F (36.9 C)  TempSrc:  Oral Oral Oral  SpO2: 99% 100% 98% 98%  Weight:      Height:        Intake/Output Summary (Last 24 hours) at 06/06/2021 0745 Last data filed at 06/05/2021 1700 Gross per 24 hour  Intake 482.87 ml  Output --  Net 482.87 ml   Last 3 Weights 06/04/2021 06/04/2021 07/20/2017  Weight (lbs) 145 lb 8 oz 160 lb 15 oz 162 lb 12.8 oz  Weight (kg) 65.998 kg 73 kg 73.846 kg      Telemetry    NSR HR 60s, PVCs - Personally Reviewed  ECG    NSR, 67bpm, TWI aVF, III - Personally Reviewed  Physical Exam   GEN: No acute distress.   Neck: No JVD Cardiac: RRR, no murmurs, rubs, or gallops.  Respiratory: Clear to auscultation bilaterally. GI: Soft, nontender, non-distended  MS: No edema; No deformity. Neuro:  Nonfocal  Psych: Normal affect   Labs    High Sensitivity Troponin:   Recent Labs  Lab 06/04/21 1812 06/04/21 2001 06/05/21 0100 06/05/21 0254  TROPONINIHS 2,952* 11,026* >24,000* >24,000*     Chemistry Recent Labs  Lab  06/04/21 1812 06/05/21 0254  NA 137 134*  K 4.1 3.8  CL 101 100  CO2 28 29  GLUCOSE 167* 197*  BUN 24* 18  CREATININE 0.94 1.00  CALCIUM 9.1 8.7*  GFRNONAA >60 >60  ANIONGAP 8 5    Lipids  Recent Labs  Lab 06/05/21 0100  CHOL 150  TRIG 48  HDL 49  LDLCALC 91  CHOLHDL 3.1    Hematology Recent Labs  Lab 06/05/21 0254 06/05/21 0505 06/06/21 0226  WBC 9.2 8.9 7.4  RBC 3.56* 3.63* 3.46*  HGB 11.4* 11.4* 10.9*  HCT 33.8* 34.4* 33.5*  MCV 94.9 94.8 96.8  MCH 32.0 31.4 31.5  MCHC 33.7 33.1 32.5  RDW 12.0 12.0 12.2  PLT 178 162 157   Thyroid  Recent Labs  Lab 06/05/21 0100  TSH 1.469  FREET4 1.01    BNPNo results for input(s): BNP, PROBNP in the last 168 hours.  DDimer No results for input(s): DDIMER in the last 168 hours.   Radiology    DG Chest Port 1 View  Result Date: 06/04/2021 CLINICAL DATA:  Short of breath, chest pain EXAM: PORTABLE CHEST 1 VIEW COMPARISON:  10/02/2010 FINDINGS: Single frontal view of the  chest demonstrates an unremarkable cardiac silhouette. Diffuse parenchymal lung scarring is noted without airspace disease, effusion, or pneumothorax. No acute bony abnormalities. IMPRESSION: 1. No acute intrathoracic process. Electronically Signed   By: Randa Ngo M.D.   On: 06/04/2021 17:38   CT Angio Chest/Abd/Pel for Dissection W and/or Wo Contrast  Result Date: 06/04/2021 CLINICAL DATA:  Chest and bilateral arm pain, lifting injury EXAM: CT ANGIOGRAPHY CHEST, ABDOMEN AND PELVIS TECHNIQUE: Non-contrast CT of the chest was initially obtained. Multidetector CT imaging through the chest, abdomen and pelvis was performed using the standard protocol during bolus administration of intravenous contrast. Multiplanar reconstructed images and MIPs were obtained and reviewed to evaluate the vascular anatomy. CONTRAST:  178mL OMNIPAQUE IOHEXOL 350 MG/ML SOLN COMPARISON:  06/04/2021 FINDINGS: CTA CHEST FINDINGS Cardiovascular: The thoracic aorta is unremarkable  without aneurysm or dissection. Mild atherosclerosis of the aorta. The heart is unremarkable without pericardial effusion. Extensive atherosclerosis of the coronary vasculature greatest in the LAD and circumflex distributions. While not optimized for evaluation of the pulmonary vasculature, there is sufficient contrast enhancement to exclude pulmonary emboli. No filling defects. Mediastinum/Nodes: No enlarged mediastinal, hilar, or axillary lymph nodes. Thyroid gland, trachea, and esophagus demonstrate no significant findings. Lungs/Pleura: Diffuse subpleural scarring and fibrosis. No acute airspace disease, effusion, or pneumothorax. Central airways are patent. Musculoskeletal: No acute or destructive bony lesions. Reconstructed images demonstrate no additional findings. Review of the MIP images confirms the above findings. CTA ABDOMEN AND PELVIS FINDINGS VASCULAR Aorta: Normal caliber aorta without aneurysm, dissection, vasculitis or significant stenosis. Mild atherosclerosis. Celiac: Patent without evidence of aneurysm, dissection, vasculitis or significant stenosis. Mild atherosclerosis. SMA: Patent without evidence of aneurysm, dissection, vasculitis or significant stenosis. Mild atherosclerosis. Renals: Both renal arteries are patent without evidence of aneurysm, dissection, vasculitis, fibromuscular dysplasia or significant stenosis. There is moderate atherosclerosis at the origin of the bilateral renal arteries without critical stenosis. IMA: Patent without evidence of aneurysm, dissection, vasculitis or significant stenosis. Inflow: Patent without evidence of aneurysm, dissection, vasculitis or significant stenosis. Veins: No obvious venous abnormality within the limitations of this arterial phase study. Review of the MIP images confirms the above findings. NON-VASCULAR Hepatobiliary: No focal liver abnormality is seen. No gallstones, gallbladder wall thickening, or biliary dilatation. Pancreas:  Unremarkable. No pancreatic ductal dilatation or surrounding inflammatory changes. Spleen: Normal in size without focal abnormality. Adrenals/Urinary Tract: There is a nonobstructing 11 mm calculus within the lower pole right kidney. Bilateral renal cortical thinning. Benign cyst upper pole right kidney. The adrenals and bladder are unremarkable. Stomach/Bowel: No bowel obstruction or ileus. Scattered diverticulosis throughout the colon without diverticulitis. No bowel wall thickening or inflammatory change. Lymphatic: No pathologic adenopathy. Reproductive: Prostate is unremarkable. Other: No free fluid or free gas.  No abdominal wall hernia. Musculoskeletal: No acute or destructive bony lesions. Right convex lumbar scoliosis and multilevel spondylosis. Reconstructed images demonstrate no additional findings. Review of the MIP images confirms the above findings. IMPRESSION: 1. No evidence of thoracoabdominal aortic aneurysm or dissection. Mild diffuse atherosclerosis. 2. No evidence of pulmonary embolus. 3. Nonobstructing 11 mm right renal calculus. 4. Aortic Atherosclerosis (ICD10-I70.0). Coronary artery atherosclerosis. Electronically Signed   By: Randa Ngo M.D.   On: 06/04/2021 19:57    Cardiac Studies   Echo ordered  Patient Profile     80 y.o. male with pmh of borderline diabetes, HLD, h/o CVA, nephrolithiasis who was transferred from [facility] for chest pain.   Assessment & Plan    NSTEMI - presented with chest pain radiating  to arm transferred to [facility] from [place] >24,000 - continue IV heparin - Nitro gtt - Echo ordered - continue ASA, statin - BB not added for low HR>>may tolerate low dose Coreg - No further chest pain - plan for cath today Risks and benefits of cardiac catheterization have been discussed with the patient.  These include bleeding, infection, kidney damage, stroke, heart attack, death.  The patient understands these risks and is willing to proceed.   HLD -  LDL 91, goal less than 70 - continue statin>>will increase to Lipitor 80mg  daily  H/o CVA s/p RCEA 2012 - continue ASA  Prediabetes - A1C 6.1 - needs to follow with PCP as OP  For questions or updates, please contact [facility] Please consult [email or web address] for contact info under        Signed, Cadence Ninfa Meeker, PA-C  06/06/2021, 7:45 AM     I have personally seen and examined this patient. I agree with the assessment and plan as outlined above.  Mr. Suder is a 80 yo male with DM, HLD transferred to [facility] with a NSTEMI, troponin over 24,000. No ischemic changes on EKG this am. No chest pain this am.  Cardiac cath is indicated. Plans for cath later this am.  Labs reviewed and ok for cath.  Continue IV heparin.  NPO for cath  I have reviewed the risks, indications, and alternatives to cardiac catheterization, possible angioplasty, and stenting with the patient. Risks include but are not limited to bleeding, infection, vascular injury, stroke, myocardial infection, arrhythmia, kidney injury, radiation-related injury in the case of prolonged fluoroscopy use, emergency cardiac surgery, and death. The patient understands the risks of serious complication is 1-2 in 2035 with diagnostic cardiac cath and 1-2% or less with angioplasty/stenting.   Lauree Chandler 06/06/2021 11:16 AM

## 2021-06-06 NOTE — Progress Notes (Signed)
TR band removed at 1815; dressing applied. Site is a level 0. Pharmacy aware.

## 2021-06-07 ENCOUNTER — Inpatient Hospital Stay (HOSPITAL_COMMUNITY): Payer: Medicare HMO

## 2021-06-07 DIAGNOSIS — I2510 Atherosclerotic heart disease of native coronary artery without angina pectoris: Secondary | ICD-10-CM | POA: Diagnosis not present

## 2021-06-07 DIAGNOSIS — I214 Non-ST elevation (NSTEMI) myocardial infarction: Secondary | ICD-10-CM | POA: Diagnosis not present

## 2021-06-07 DIAGNOSIS — I25110 Atherosclerotic heart disease of native coronary artery with unstable angina pectoris: Secondary | ICD-10-CM

## 2021-06-07 LAB — ECHOCARDIOGRAM COMPLETE
AR max vel: 2.17 cm2
AV Area VTI: 2.3 cm2
AV Area mean vel: 2.13 cm2
AV Mean grad: 2 mmHg
AV Peak grad: 4.2 mmHg
Ao pk vel: 1.03 m/s
Area-P 1/2: 3.42 cm2
Height: 70 in
S' Lateral: 3.3 cm
Weight: 2328 oz

## 2021-06-07 LAB — CBC
HCT: 37.4 % — ABNORMAL LOW (ref 39.0–52.0)
Hemoglobin: 12.2 g/dL — ABNORMAL LOW (ref 13.0–17.0)
MCH: 31.4 pg (ref 26.0–34.0)
MCHC: 32.6 g/dL (ref 30.0–36.0)
MCV: 96.1 fL (ref 80.0–100.0)
Platelets: 188 10*3/uL (ref 150–400)
RBC: 3.89 MIL/uL — ABNORMAL LOW (ref 4.22–5.81)
RDW: 12.1 % (ref 11.5–15.5)
WBC: 6.4 10*3/uL (ref 4.0–10.5)
nRBC: 0 % (ref 0.0–0.2)

## 2021-06-07 LAB — BASIC METABOLIC PANEL
Anion gap: 8 (ref 5–15)
BUN: 14 mg/dL (ref 8–23)
CO2: 24 mmol/L (ref 22–32)
Calcium: 9.2 mg/dL (ref 8.9–10.3)
Chloride: 106 mmol/L (ref 98–111)
Creatinine, Ser: 0.89 mg/dL (ref 0.61–1.24)
GFR, Estimated: >60 mL/min (ref >60)
Glucose, Bld: 118 mg/dL — ABNORMAL HIGH (ref 70–99)
Potassium: 3.9 mmol/L (ref 3.5–5.1)
Sodium: 138 mmol/L (ref 135–145)

## 2021-06-07 LAB — HEPARIN LEVEL (UNFRACTIONATED)
Heparin Unfractionated: 0.33 IU/mL (ref 0.30–0.70)
Heparin Unfractionated: 0.48 IU/mL (ref 0.30–0.70)

## 2021-06-07 MED ORDER — PERFLUTREN LIPID MICROSPHERE
1.0000 mL | INTRAVENOUS | Status: AC | PRN
Start: 2021-06-07 — End: 2021-06-07
  Administered 2021-06-07: 2 mL via INTRAVENOUS
  Filled 2021-06-07: qty 10

## 2021-06-07 MED ORDER — MAGNESIUM HYDROXIDE 400 MG/5ML PO SUSP
30.0000 mL | Freq: Every day | ORAL | Status: DC | PRN
  Administered 2021-06-07: 30 mL via ORAL
  Filled 2021-06-07: qty 30

## 2021-06-07 NOTE — Progress Notes (Signed)
Cardiology Progress Note  Patient ID: Adam [name] MRN: [id number] DOB: 04/05/1941 Date of Encounter: 06/07/2021  Primary Cardiologist: None  Subjective   Chief Complaint: None.   HPI: Left heart cath with three-vessel CAD.  CT surgery consult pending.  Denies chest pain or trouble breathing.  ROS:  All other ROS reviewed and negative. Pertinent positives noted in the HPI.     Inpatient Medications  Scheduled Meds:  aspirin EC  81 mg Oral Daily   atorvastatin  80 mg Oral Daily   brimonidine  1 drop Right Eye BID   carvedilol  3.125 mg Oral BID WC   latanoprost  1 drop Both Eyes QHS   sodium chloride flush  3 mL Intravenous Q12H   timolol  1 drop Right Eye BID   Continuous Infusions:  sodium chloride     heparin 1,100 Units/hr (06/07/21 0849)   nitroGLYCERIN Stopped (06/06/21 1244)   PRN Meds: sodium chloride, acetaminophen, nitroGLYCERIN, ondansetron (ZOFRAN) IV, sodium chloride flush, zolpidem   Vital Signs   Vitals:   06/06/21 1830 06/06/21 2000 06/07/21 0535 06/07/21 0842  BP: 132/67 114/67 (!) 146/82 135/74  Pulse: 71  77 80  Resp:  17 18 18   Temp:  98 F (36.7 C) 97.7 F (36.5 C)   TempSrc:  Oral Oral   SpO2:  98% 99%   Weight:      Height:        Intake/Output Summary (Last 24 hours) at 06/07/2021 0858 Last data filed at 06/07/2021 0849 Gross per 24 hour  Intake 2328.2 ml  Output 100 ml  Net 2228.2 ml   Last 3 Weights 06/04/2021 06/04/2021 07/20/2017  Weight (lbs) 145 lb 8 oz 160 lb 15 oz 162 lb 12.8 oz  Weight (kg) 65.998 kg 73 kg 73.846 kg      Telemetry  Overnight telemetry shows sinus rhythm in the 80s, which I personally reviewed.   ECG  The most recent ECG shows sinus rhythm heart rate 65, nonspecific ST-T changes, which I personally reviewed.   Physical Exam   Vitals:   06/06/21 1830 06/06/21 2000 06/07/21 0535 06/07/21 0842  BP: 132/67 114/67 (!) 146/82 135/74  Pulse: 71  77 80  Resp:  17 18 18   Temp:  98 F (36.7 C) 97.7 F  (36.5 C)   TempSrc:  Oral Oral   SpO2:  98% 99%   Weight:      Height:        Intake/Output Summary (Last 24 hours) at 06/07/2021 0858 Last data filed at 06/07/2021 0849 Gross per 24 hour  Intake 2328.2 ml  Output 100 ml  Net 2228.2 ml    Last 3 Weights 06/04/2021 06/04/2021 07/20/2017  Weight (lbs) 145 lb 8 oz 160 lb 15 oz 162 lb 12.8 oz  Weight (kg) 65.998 kg 73 kg 73.846 kg    Body mass index is 20.88 kg/m.  General: Well nourished, well developed, in no acute distress Head: Atraumatic, normal size  Eyes: PEERLA, EOMI  Neck: Supple, no JVD Endocrine: No thryomegaly Cardiac: Normal S1, S2; RRR; no murmurs, rubs, or gallops Lungs: Clear to auscultation bilaterally, no wheezing, rhonchi or rales  Abd: Soft, nontender, no hepatomegaly  Ext: No edema, pulses 2+, right radial cath site clean and dry, 2+ pulse Musculoskeletal: No deformities, BUE and BLE strength normal and equal Skin: Warm and dry, no rashes   Neuro: Alert and oriented to person, place, time, and situation, CNII-XII grossly intact, no focal deficits  Psych:  Normal mood and affect   Labs  High Sensitivity Troponin:   Recent Labs  Lab 06/04/21 1812 06/04/21 2001 06/05/21 0100 06/05/21 0254  TROPONINIHS 2,952* 11,026* >24,000* >24,000*     Cardiac EnzymesNo results for input(s): TROPONINI in the last 168 hours. No results for input(s): TROPIPOC in the last 168 hours.  Chemistry Recent Labs  Lab 06/04/21 1812 06/05/21 0254  NA 137 134*  K 4.1 3.8  CL 101 100  CO2 28 29  GLUCOSE 167* 197*  BUN 24* 18  CREATININE 0.94 1.00  CALCIUM 9.1 8.7*  GFRNONAA >60 >60  ANIONGAP 8 5    Hematology Recent Labs  Lab 06/05/21 0505 06/06/21 0226 06/07/21 0629  WBC 8.9 7.4 6.4  RBC 3.63* 3.46* 3.89*  HGB 11.4* 10.9* 12.2*  HCT 34.4* 33.5* 37.4*  MCV 94.8 96.8 96.1  MCH 31.4 31.5 31.4  MCHC 33.1 32.5 32.6  RDW 12.0 12.2 12.1  PLT 162 157 188   BNPNo results for input(s): BNP, PROBNP in the last 168  hours.  DDimer No results for input(s): DDIMER in the last 168 hours.   Radiology  CARDIAC CATHETERIZATION  Result Date: 06/06/2021   Dist LM lesion is 30% stenosed.   Ost LAD lesion is 70% stenosed.   Prox LAD to Mid LAD lesion is 90% stenosed.   Mid LAD to Dist LAD lesion is 90% stenosed.   Prox Cx to Mid Cx lesion is 100% stenosed.   Ramus lesion is 95% stenosed.   Lat Ramus lesion is 90% stenosed.   Prox RCA lesion is 95% stenosed.   Mid RCA lesion is 60% stenosed.   There is mild left ventricular systolic dysfunction.   LV end diastolic pressure is normal.   The left ventricular ejection fraction is 50-55% by visual estimate. 1.  Mild distal left main stenosis 2.  Severe multisegment LAD stenoses with 70 to 75% ostial stenosis, 95% proximal stenosis, and 95% mid vessel stenosis 3.  Severe bifurcation stenosis involving the intermediate branch with 90 to 95% lesions in both sub-branches 4.  Total occlusion of the mid circumflex at a small first OM branch. 5.  Severe proximal stenosis of the RCA and moderate stenosis of the mid RCA and an area of tortuosity 6.  Mild segmental LV dysfunction with inferior wall hypokinesis, LVEF estimated at approximately 50 to 55%. Recommendations: T CTS consultation for consideration of multivessel CABG.  If the patient is not a candidate for CABG, could consider multivessel PCI as an alternative. Anatomy not favorable for PCI with ostial LAD stenosis, bifurcation disease of the ramus, and large extent of obstructive disease.    Cardiac Studies  LHC 06/06/2021 1.  Mild distal left main stenosis 2.  Severe multisegment LAD stenoses with 70 to 75% ostial stenosis, 95% proximal stenosis, and 95% mid vessel stenosis 3.  Severe bifurcation stenosis involving the intermediate branch with 90 to 95% lesions in both sub-branches 4.  Total occlusion of the mid circumflex at a small first OM branch. 5.  Severe proximal stenosis of the RCA and moderate stenosis of the mid RCA  and an area of tortuosity 6.  Mild segmental LV dysfunction with inferior wall hypokinesis, LVEF estimated at approximately 50 to 55%.   Recommendations: T CTS consultation for consideration of multivessel CABG.  If the patient is not a candidate for CABG, could consider multivessel PCI as an alternative. Anatomy not favorable for PCI with ostial LAD stenosis, bifurcation disease of the ramus, and large extent  of obstructive disease.   Patient Profile  ORTON CAPELL is a 80 y.o. male with hyperlipidemia, history of stroke who was admitted on 06/05/2021 with non-STEMI.  Left heart catheterization with three-vessel CAD.  Assessment & Plan   #NSTEMI #3v CAD -Admitted with non-STEMI.  High sensitive troponin greater than 24,000.  This suggest very large myocardial infarction.  -Left heart catheterization yesterday shows ostial LAD disease up to 75%, severe bifurcation disease, total occlusion of the circumflex, severe proximal RCA disease -Anatomy is not favorable for PCI.  Awaiting CABG consultation. -Denies chest pain or trouble breathing.  Echo is pending. -Continue aspirin and heparin drip.  He is on high intensity statin.  He is also on a beta-blocker.  Further titration of medical therapy pending echocardiogram. -TSH normal.  A1c 6.1.  Hemoglobin is stable.  Most recent LDL cholesterol 91.  No signs of congestive heart failure.  FEN -no IVF -Code: Full -DVT PPx: Heparin drip -Diet: Heart healthy  For questions or updates, please contact [place] Please consult [email or web address] for contact info under   Time Spent with Patient: I have spent a total of 35 minutes with patient reviewing hospital notes, telemetry, EKGs, labs and examining the patient as well as establishing an assessment and plan that was discussed with the patient.  > 50% of time was spent in direct patient care.    Signed, Addison Naegeli. Audie Box, MD, [place]  06/07/2021 8:58 AM

## 2021-06-07 NOTE — H&P (View-Only) (Signed)
[address]       [place] [address]             [phone]      Cardiothoracic Surgery Consultation  Reason for Consult: Severe multivessel coronary disease Referring Physician: Dr. Sherren Mocha  Adam [name] is an 80 y.o. male.  HPI:   The patient is a 80 year old gentleman with a history of hyperlipidemia, diet-controlled diabetes, prior stroke symptoms in the right eye with right carotid endarterectomy and resolution of his symptoms, he reports being asymptomatic until he woke up this past Wednesday with aching pain in his back that he rated a 6/10 in severity.  He said that he got up and stretched and the pain went away and he went back to bed.  The following day he was lifting logs from his front porch and carrying them inside with his arms over his head.  He developed the same back pain as well as pain in both upper extremities which improved with rest.  He also developed some mild shortness of breath and nausea and presented to [facility] for evaluation.  His initial high-sensitivity troponin was 2952 and increased to greater than 24,000.  He ruled in for a NSTEMI.  He underwent cardiac catheterization yesterday which showed severe multivessel coronary disease with left ventricular ejection fraction of 50 to 55%.  His disease not felt to be ideal for PCI with a high-grade ostial LAD stenosis, bifurcation disease of the ramus branch, and multivessel coronary obstruction.  A 2D echo today showed an ejection fraction of 55 to 60% with no regional wall motion abnormality.  There is mild mitral valve regurgitation.  There is no aortic stenosis or insufficiency.  He has had no further chest or back discomfort since admission.  The patient is married and lives at home with his wife who has stage IV breast cancer.  He provides most of her care.  He has a son and daughter who live close by and are helping him out.  He is a lifelong non-smoker and does not drink  alcohol.  Past Medical History:  Diagnosis Date   Borderline diabetic    DIET CONTROLLED   History of kidney stones    Hyperlipemia    Paratesticular mass    PONV (postoperative nausea and vomiting)    Stroke ([place])    Right Eye / VISUAL PROBLEM RESOLVED AFTER ENDARTERECTOMY    Past Surgical History:  Procedure Laterality Date   APPENDECTOMY  07/1972   [place]   CAROTID ENDARTERECTOMY  10/03/10   [facility]   CATARACT EXTRACTION W/PHACO  01/26/2011   Procedure: CATARACT EXTRACTION PHACO AND INTRAOCULAR LENS PLACEMENT ([place]);  Surgeon: Williams Che;  Location: [facility];  Service: Ophthalmology;  Laterality: Right;  CDE:8.64   CATARACT EXTRACTION W/PHACO Left 02/12/2014   Procedure: CATARACT EXTRACTION PHACO AND INTRAOCULAR LENS PLACEMENT ([facility]);  Surgeon: Williams Che, MD;  Location: [facility];  Service: Ophthalmology;  Laterality: Left;  CDE 6.25   CYSTOSCOPY  1970   [place]   EYE SURGERY  06/30/2010   detached retina, [facility]   EYE SURGERY Left Nov 26, 2014   Detached Retina   HYDROCELE EXCISION Left 09/30/2015   Procedure: HYDROCELECTOMY ADULT;  Surgeon: Cleon Gustin, MD;  Location: [facility];  Service: Urology;  Laterality: Left;   IRRIGATION AND DEBRIDEMENT SEBACEOUS CYST  2001, 2007   [place], [place], in [place] office   LEFT HEART CATH AND CORONARY  ANGIOGRAPHY N/A 06/06/2021   Procedure: LEFT HEART CATH AND CORONARY ANGIOGRAPHY;  Surgeon: Sherren Mocha, MD;  Location: [facility];  Service: Cardiovascular;  Laterality: N/A;   SKIN CANCER EXCISION  2010   pre cancerous tissue:nose, scalp, DR. Hall's office   TESTICULAR EXPLORATION Left 09/30/2015   Procedure: INGUINAL PARATESTICULAR  MASS REMOVAL;  Surgeon: Cleon Gustin, MD;  Location: [facility];  Service: Urology;  Laterality: Left;   YAG LASER APPLICATION Right 0/03/3234   Procedure: YAG LASER APPLICATION;  Surgeon: Williams Che, MD;  Location: [facility];  Service: Ophthalmology;  Laterality: Right;    Family History  Problem  Relation Age of Onset   Hypertension Father    Heart disease Father        After age 57   Anesthesia problems Neg Hx    Hypotension Neg Hx    Malignant hyperthermia Neg Hx    Pseudochol deficiency Neg Hx     Social History:  reports that he has never smoked. He has never used smokeless tobacco. He reports that he does not drink alcohol and does not use drugs.  Allergies: No Known Allergies  Medications: I have reviewed the patient's current medications. Prior to Admission:  Medications Prior to Admission  Medication Sig Dispense Refill Last Dose   aspirin EC 81 MG tablet Take 81 mg by mouth daily as needed (blood thinner). Swallow whole.   Past Week   brimonidine (ALPHAGAN) 0.2 % ophthalmic solution INSTILL 1 DROP INTO RIGHT EYE TWICE DAILY 5 mL 0 06/04/2021   Cholecalciferol (VITAMIN D3 PO) Take 1 tablet by mouth daily.   06/03/2021   Chromium 1 MG CAPS Take 1 mg by mouth daily.   06/03/2021   ibuprofen (ADVIL,MOTRIN) 200 MG tablet Take 200 mg by mouth every 8 (eight) hours as needed. For pain    unknown   latanoprost (XALATAN) 0.005 % ophthalmic solution Place 1 drop into both eyes at bedtime.   06/03/2021   MAGNESIUM PO Take 250 mg by mouth once a week.    Past Week   timolol (TIMOPTIC) 0.5 % ophthalmic solution INSTILL 1 DROP INTO RIGHT EYE ONCE DAILY (Patient taking differently: Place 1 drop into the right eye 2 (two) times daily.) 5 mL 0 06/03/2021   VANADIUM PO Take 1 tablet by mouth daily.   06/03/2021   aspirin 325 MG EC tablet Take 162.5 mg by mouth daily. (Patient not taking: Reported on 06/04/2021)   Not Taking   HYDROcodone-acetaminophen (NORCO) 5-325 MG tablet Take 1 tablet by mouth every 6 (six) hours as needed for moderate pain. (Patient not taking: Reported on 01/10/2016) 30 tablet 0 Not Taking   niacin 500 MG tablet Take 500 mg by mouth once a week. (Patient not taking: Reported on 06/04/2021)   Not Taking   timolol (BETIMOL) 0.5 % ophthalmic solution Place 1 drop into  the right eye daily.  (Patient not taking: Reported on 06/04/2021)   Not Taking   Scheduled:  aspirin EC  81 mg Oral Daily   atorvastatin  80 mg Oral Daily   brimonidine  1 drop Right Eye BID   carvedilol  3.125 mg Oral BID WC   latanoprost  1 drop Both Eyes QHS   sodium chloride flush  3 mL Intravenous Q12H   timolol  1 drop Right Eye BID   Continuous:  sodium chloride     heparin 1,100 Units/hr (06/07/21 1602)   TDD:UKGURK chloride, acetaminophen, nitroGLYCERIN, ondansetron (ZOFRAN) IV, perflutren  lipid microspheres (DEFINITY) IV suspension, sodium chloride flush, zolpidem Anti-infectives (From admission, onward)    None       Results for orders placed or performed during the hospital encounter of 06/04/21 (from the past 48 hour(s))  Heparin level (unfractionated)     Status: None   Collection Time: 06/06/21  2:26 AM  Result Value Ref Range   Heparin Unfractionated 0.65 0.30 - 0.70 IU/mL    Comment: (NOTE) The clinical reportable range upper limit is being lowered to >1.10 to align with the FDA approved guidance for the current laboratory assay.  If heparin results are below expected values, and patient dosage has  been confirmed, suggest follow up testing of antithrombin III levels. Performed at [facility], [place] [address], [place], [place] [address]   CBC     Status: Abnormal   Collection Time: 06/06/21  2:26 AM  Result Value Ref Range   WBC 7.4 4.0 - 10.5 K/uL   RBC 3.46 (L) 4.22 - 5.81 MIL/uL   Hemoglobin 10.9 (L) 13.0 - 17.0 g/dL   HCT 33.5 (L) 39.0 - 52.0 %   MCV 96.8 80.0 - 100.0 fL   MCH 31.5 26.0 - 34.0 pg   MCHC 32.5 30.0 - 36.0 g/dL   RDW 12.2 11.5 - 15.5 %   Platelets 157 150 - 400 K/uL   nRBC 0.0 0.0 - 0.2 %    Comment: Performed at [facility], [place] [address] [place], [place] [address]  CBC     Status: Abnormal   Collection Time: 06/07/21  6:29 AM  Result Value Ref Range   WBC 6.4 4.0 - 10.5 K/uL   RBC 3.89 (L) 4.22 - 5.81 MIL/uL    Hemoglobin 12.2 (L) 13.0 - 17.0 g/dL   HCT 37.4 (L) 39.0 - 52.0 %   MCV 96.1 80.0 - 100.0 fL   MCH 31.4 26.0 - 34.0 pg   MCHC 32.6 30.0 - 36.0 g/dL   RDW 12.1 11.5 - 15.5 %   Platelets 188 150 - 400 K/uL   nRBC 0.0 0.0 - 0.2 %    Comment: Performed at [facility], [place] [address], [place], [place] [address]  Heparin level (unfractionated)     Status: None   Collection Time: 06/07/21  6:29 AM  Result Value Ref Range   Heparin Unfractionated 0.33 0.30 - 0.70 IU/mL    Comment: (NOTE) The clinical reportable range upper limit is being lowered to >1.10 to align with the FDA approved guidance for the current laboratory assay.  If heparin results are below expected values, and patient dosage has  been confirmed, suggest follow up testing of antithrombin III levels. Performed at [facility], [place] [address], [place], [place] [address]   Basic metabolic panel     Status: Abnormal   Collection Time: 06/07/21  6:29 AM  Result Value Ref Range   Sodium 138 135 - 145 mmol/L   Potassium 3.9 3.5 - 5.1 mmol/L   Chloride 106 98 - 111 mmol/L   CO2 24 22 - 32 mmol/L   Glucose, Bld 118 (H) 70 - 99 mg/dL    Comment: Glucose reference range applies only to samples taken after fasting for at least 8 hours.   BUN 14 8 - 23 mg/dL   Creatinine, Ser 0.89 0.61 - 1.24 mg/dL   Calcium 9.2 8.9 - 10.3 mg/dL   GFR, Estimated >60 >60 mL/min    Comment: (NOTE) Calculated using  the CKD-EPI Creatinine Equation (2021)    Anion gap 8 5 - 15    Comment: Performed at [facility], [place] [address] [place],  [address]    CARDIAC CATHETERIZATION  Result Date: 06/06/2021   Dist LM lesion is 30% stenosed.   Ost LAD lesion is 70% stenosed.   Prox LAD to Mid LAD lesion is 90% stenosed.   Mid LAD to Dist LAD lesion is 90% stenosed.   Prox Cx to Mid Cx lesion is 100% stenosed.   Ramus lesion is 95% stenosed.   Lat Ramus lesion is 90% stenosed.   Prox RCA lesion is 95% stenosed.   Mid RCA  lesion is 60% stenosed.   There is mild left ventricular systolic dysfunction.   LV end diastolic pressure is normal.   The left ventricular ejection fraction is 50-55% by visual estimate. 1.  Mild distal left main stenosis 2.  Severe multisegment LAD stenoses with 70 to 75% ostial stenosis, 95% proximal stenosis, and 95% mid vessel stenosis 3.  Severe bifurcation stenosis involving the intermediate branch with 90 to 95% lesions in both sub-branches 4.  Total occlusion of the mid circumflex at a small first OM branch. 5.  Severe proximal stenosis of the RCA and moderate stenosis of the mid RCA and an area of tortuosity 6.  Mild segmental LV dysfunction with inferior wall hypokinesis, LVEF estimated at approximately 50 to 55%. Recommendations: T CTS consultation for consideration of multivessel CABG.  If the patient is not a candidate for CABG, could consider multivessel PCI as an alternative. Anatomy not favorable for PCI with ostial LAD stenosis, bifurcation disease of the ramus, and large extent of obstructive disease.   ECHOCARDIOGRAM COMPLETE  Result Date: 06/07/2021    ECHOCARDIOGRAM REPORT   Patient Name:   Adam [name] [place] Date of Exam: 06/07/2021 Medical Rec #:  [id number]      Height:       70.0 in Accession #:    [id number]     Weight:       145.5 lb Date of Birth:  08/22/1940      BSA:          1.823 m Patient Age:    75 years       BP:           123/87 mmHg Patient Gender: M              HR:           55 bpm. Exam Location:  Inpatient Procedure: 2D Echo, Cardiac Doppler, Color Doppler and Intracardiac            Opacification Agent Indications:    CAD  History:        Patient has no prior history of Echocardiogram examinations.                 CAD.  Sonographer:    Merrie Roof RDCS Referring Phys: [phone] FAN YE IMPRESSIONS  1. Left ventricular ejection fraction, by estimation, is 55 to 60%. The left ventricle has normal function. The left ventricle has no regional wall motion abnormalities. Left  ventricular diastolic parameters were normal.  2. Right ventricular systolic function is normal. The right ventricular size is normal.  3. The mitral valve is normal in structure. Mild mitral valve regurgitation.  4. The aortic valve is tricuspid. Aortic valve regurgitation is not visualized. No aortic stenosis is present.  5. The inferior vena cava is normal in size  with greater than 50% respiratory variability, suggesting right atrial pressure of 3 mmHg. FINDINGS  Left Ventricle: Left ventricular ejection fraction, by estimation, is 55 to 60%. The left ventricle has normal function. The left ventricle has no regional wall motion abnormalities. Definity contrast agent was given IV to delineate the left ventricular  endocardial borders. The left ventricular internal cavity size was normal in size. There is no left ventricular hypertrophy. Left ventricular diastolic parameters were normal. Right Ventricle: The right ventricular size is normal. No increase in right ventricular wall thickness. Right ventricular systolic function is normal. Left Atrium: Left atrial size was normal in size. Right Atrium: Right atrial size was normal in size. Pericardium: There is no evidence of pericardial effusion. Mitral Valve: The mitral valve is normal in structure. Mild mitral valve regurgitation. Tricuspid Valve: The tricuspid valve is normal in structure. Tricuspid valve regurgitation is trivial. Aortic Valve: The aortic valve is tricuspid. Aortic valve regurgitation is not visualized. No aortic stenosis is present. Aortic valve mean gradient measures 2.0 mmHg. Aortic valve peak gradient measures 4.2 mmHg. Aortic valve area, by VTI measures 2.30 cm. Pulmonic Valve: The pulmonic valve was not well visualized. Pulmonic valve regurgitation is trivial. Aorta: The aortic root and ascending aorta are structurally normal, with no evidence of dilitation. Venous: The inferior vena cava is normal in size with greater than 50% respiratory  variability, suggesting right atrial pressure of 3 mmHg. IAS/Shunts: The interatrial septum was not well visualized.  LEFT VENTRICLE PLAX 2D LVIDd:         4.40 cm   Diastology LVIDs:         3.30 cm   LV e' medial:    6.42 cm/s LV PW:         1.00 cm   LV E/e' medial:  9.9 LV IVS:        0.80 cm   LV e' lateral:   10.70 cm/s LVOT diam:     2.00 cm   LV E/e' lateral: 5.9 LV SV:         48 LV SV Index:   26 LVOT Area:     3.14 cm  RIGHT VENTRICLE          IVC RV Basal diam:  3.80 cm  IVC diam: 1.80 cm LEFT ATRIUM             Index        RIGHT ATRIUM           Index LA diam:        3.80 cm 2.08 cm/m   RA Area:     17.80 cm LA Vol (A2C):   54.6 ml 29.95 ml/m  RA Volume:   51.10 ml  28.03 ml/m LA Vol (A4C):   41.9 ml 22.98 ml/m LA Biplane Vol: 51.7 ml 28.35 ml/m  AORTIC VALVE AV Area (Vmax):    2.17 cm AV Area (Vmean):   2.13 cm AV Area (VTI):     2.30 cm AV Vmax:           103.00 cm/s AV Vmean:          65.000 cm/s AV VTI:            0.209 m AV Peak Grad:      4.2 mmHg AV Mean Grad:      2.0 mmHg LVOT Vmax:         71.00 cm/s LVOT Vmean:        44.000 cm/s LVOT  VTI:          0.153 m LVOT/AV VTI ratio: 0.73  AORTA Ao Root diam: 3.50 cm Ao Asc diam:  3.00 cm MITRAL VALVE MV Area (PHT): 3.42 cm    SHUNTS MV Decel Time: 222 msec    Systemic VTI:  0.15 m MV E velocity: 63.60 cm/s  Systemic Diam: 2.00 cm MV A velocity: 62.20 cm/s MV E/A ratio:  1.02 Oswaldo Milian MD Electronically signed by Oswaldo Milian MD Signature Date/Time: 06/07/2021/3:45:08 PM    Final     Review of Systems  Constitutional:  Negative for activity change and fatigue.  HENT: Negative.    Eyes: Negative.   Respiratory:  Positive for shortness of breath. Negative for chest tightness.   Cardiovascular:  Negative for chest pain, palpitations and leg swelling.  Gastrointestinal:  Positive for nausea.  Endocrine: Negative.   Genitourinary: Negative.   Musculoskeletal:  Positive for back pain.  Skin: Negative.    Allergic/Immunologic: Negative.   Neurological:  Negative for dizziness and syncope.  Hematological: Negative.   Psychiatric/Behavioral: Negative.    Blood pressure 136/69, pulse 63, temperature 97.7 F (36.5 C), temperature source Oral, resp. rate 18, height 5\' 10"  (1.778 m), weight 66 kg, SpO2 98 %. Physical Exam Constitutional:      Appearance: He is well-developed.     Comments: Thin elderly gentleman in no distress  HENT:     Head: Normocephalic and atraumatic.  Eyes:     Extraocular Movements: Extraocular movements intact.     Conjunctiva/sclera: Conjunctivae normal.     Pupils: Pupils are equal, round, and reactive to light.  Neck:     Vascular: No carotid bruit.  Cardiovascular:     Rate and Rhythm: Normal rate and regular rhythm.     Pulses: Normal pulses.     Heart sounds: Normal heart sounds. No murmur heard. Pulmonary:     Effort: Pulmonary effort is normal.     Breath sounds: Normal breath sounds.  Abdominal:     General: Abdomen is flat. Bowel sounds are normal. There is no distension.     Palpations: Abdomen is soft.     Tenderness: There is no abdominal tenderness.  Musculoskeletal:        General: No swelling. Normal range of motion.     Cervical back: Normal range of motion and neck supple.  Skin:    General: Skin is warm and dry.  Neurological:     General: No focal deficit present.     Mental Status: He is alert and oriented to person, place, and time.  Psychiatric:        Mood and Affect: Mood normal.        Behavior: Behavior normal.   Physicians  Panel Physicians Referring Physician Case Authorizing Physician  Sherren Mocha, MD (Primary)     Procedures  LEFT HEART CATH AND CORONARY ANGIOGRAPHY   Conclusion      Dist LM lesion is 30% stenosed.   Ost LAD lesion is 70% stenosed.   Prox LAD to Mid LAD lesion is 90% stenosed.   Mid LAD to Dist LAD lesion is 90% stenosed.   Prox Cx to Mid Cx lesion is 100% stenosed.   Ramus lesion is 95%  stenosed.   Lat Ramus lesion is 90% stenosed.   Prox RCA lesion is 95% stenosed.   Mid RCA lesion is 60% stenosed.   There is mild left ventricular systolic dysfunction.   LV end diastolic pressure is normal.  The left ventricular ejection fraction is 50-55% by visual estimate.   1.  Mild distal left main stenosis 2.  Severe multisegment LAD stenoses with 70 to 75% ostial stenosis, 95% proximal stenosis, and 95% mid vessel stenosis 3.  Severe bifurcation stenosis involving the intermediate branch with 90 to 95% lesions in both sub-branches 4.  Total occlusion of the mid circumflex at a small first OM branch. 5.  Severe proximal stenosis of the RCA and moderate stenosis of the mid RCA and an area of tortuosity 6.  Mild segmental LV dysfunction with inferior wall hypokinesis, LVEF estimated at approximately 50 to 55%.   Recommendations: T CTS consultation for consideration of multivessel CABG.  If the patient is not a candidate for CABG, could consider multivessel PCI as an alternative. Anatomy not favorable for PCI with ostial LAD stenosis, bifurcation disease of the ramus, and large extent of obstructive disease.    Procedural Details  Technical Details INDICATION: NSTEMI.  No past cardiac history.  The patient presents with severe chest pain and is found to have a troponin greater than 24,000.  He is referred for cardiac catheterization and possible PCI.  The patient has been chest pain-free since hospitalized on IV nitroglycerin and heparin.  PROCEDURAL DETAILS: The right wrist is prepped, draped, and anesthetized with 1% lidocaine. Using the modified Seldinger technique, a 5/6 French Slender sheath is introduced into the right radial artery. 3 mg of verapamil is administered through the sheath, weight-based unfractionated heparin was administered intravenously. Standard Judkins catheters are used for selective coronary angiography. LV pressure is recorded and an aortic valve pullback  gradient is measured.  Left ventriculography is performed with hand-injection.  Catheter exchanges are performed over an exchange length guidewire. There are no immediate procedural complications. A TR band is used for radial hemostasis at the completion of the procedure.  The patient was transferred to the post catheterization recovery area for further monitoring.      Estimated blood loss <50 mL.   During this procedure medications were administered to achieve and maintain moderate conscious sedation while the patient's heart rate, blood pressure, and oxygen saturation were continuously monitored and I was present face-to-face 100% of this time.   Medications (Filter: Administrations occurring from 1159 to 1312 on 06/06/21)  important  Continuous medications are totaled by the amount administered until 06/06/21 1312.   Heparin (Porcine) in NaCl 1000-0.9 UT/500ML-% SOLN (mL) Total volume:  1,000 mL Date/Time Rate/Dose/Volume Action   06/06/21 1207 500 mL Given   1207 500 mL Given    midazolam (VERSED) injection (mg) Total dose:  2 mg Date/Time Rate/Dose/Volume Action   06/06/21 1233 2 mg Given    fentaNYL (SUBLIMAZE) injection (mcg) Total dose:  50 mcg Date/Time Rate/Dose/Volume Action   06/06/21 1234 50 mcg Given    lidocaine (PF) (XYLOCAINE) 1 % injection (mL) Total volume:  2 mL Date/Time Rate/Dose/Volume Action   06/06/21 1242 2 mL Given    Radial Cocktail/Verapamil only (mL) Total volume:  10 mL Date/Time Rate/Dose/Volume Action   06/06/21 1243 10 mL Given    heparin sodium (porcine) injection (Units) Total dose:  4,000 Units Date/Time Rate/Dose/Volume Action   06/06/21 1245 4,000 Units Given    iohexol (OMNIPAQUE) 350 MG/ML injection (mL) Total volume:  50 mL Date/Time Rate/Dose/Volume Action   06/06/21 1300 50 mL Given    acetaminophen (TYLENOL) tablet 650 mg (mg) Total dose:  Cannot be calculated* Dosing weight:  66 *Administration dose not  documented Date/Time Rate/Dose/Volume Action  06/06/21 1159 *Not included in total MAR Hold    aspirin EC tablet 81 mg (mg) Total dose:  Cannot be calculated* Dosing weight:  66 *Administration dose not documented Date/Time Rate/Dose/Volume Action   06/06/21 1159 *Not included in total MAR Hold    atorvastatin (LIPITOR) tablet 80 mg (mg) Total dose:  Cannot be calculated* Dosing weight:  66 *Administration dose not documented Date/Time Rate/Dose/Volume Action   06/06/21 1159 *Not included in total MAR Hold    brimonidine (ALPHAGAN) 0.2 % ophthalmic solution 1 drop (drop) Total dose:  Cannot be calculated* *Administration dose not documented Date/Time Rate/Dose/Volume Action   06/06/21 1159 *Not included in total MAR Hold    carvedilol (COREG) tablet 3.125 mg (mg) Total dose:  Cannot be calculated* Dosing weight:  66 *Administration dose not documented Date/Time Rate/Dose/Volume Action   06/06/21 1159 *Not included in total MAR Hold    latanoprost (XALATAN) 0.005 % ophthalmic solution 1 drop (drop) Total dose:  Cannot be calculated* Dosing weight:  66 *Administration dose not documented Date/Time Rate/Dose/Volume Action   06/06/21 1159 *Not included in total MAR Hold    nitroGLYCERIN (NITROSTAT) SL tablet 0.4 mg (mg) Total dose:  Cannot be calculated* *Administration dose not documented Date/Time Rate/Dose/Volume Action   06/06/21 1159 *Not included in total MAR Hold    ondansetron (ZOFRAN) injection 4 mg (mg) Total dose:  Cannot be calculated* Dosing weight:  66 *Administration dose not documented Date/Time Rate/Dose/Volume Action   06/06/21 1159 *Not included in total MAR Hold    timolol (TIMOPTIC) 0.5 % ophthalmic solution 1 drop (drop) Total dose:  Cannot be calculated* *Administration dose not documented Date/Time Rate/Dose/Volume Action   06/06/21 1159 *Not included in total MAR Hold    zolpidem (AMBIEN) tablet 5 mg (mg) Total dose:  Cannot be calculated*  Dosing weight:  66 *Administration dose not documented Date/Time Rate/Dose/Volume Action   06/06/21 1159 *Not included in total MAR Hold    nitroGLYCERIN 50 mg in dextrose 5 % 250 mL (0.2 mg/mL) infusion (mcg/min) Total dose:  Cannot be calculated* Dosing weight:  73 *Administration dose not documented Date/Time Rate/Dose/Volume Action   06/06/21 1159 *Not included in total MAR Hold   1244  Stopped    Sedation Time  Sedation Time Physician-1: 23 minutes 20 seconds Contrast  Medication Name Total Dose  iohexol (OMNIPAQUE) 350 MG/ML injection 50 mL   Radiation/Fluoro  Fluoro time: 2.5 (min) DAP: 10358 (mGycm2) Cumulative Air Kerma: 433 (mGy) Complications  Complications documented before study signed (06/06/2021  2:95 PM)   No complications were associated with this study.  Documented by Garnette Scheuermann, RT - 06/06/2021 12:58 PM     Coronary Findings  Diagnostic Dominance: Right Left Main  Dist LM lesion is 30% stenosed.    Left Anterior Descending  Ost LAD lesion is 70% stenosed.  Prox LAD to Mid LAD lesion is 90% stenosed. The lesion is calcified.  Mid LAD to Dist LAD lesion is 90% stenosed.    Ramus Intermedius  Ramus lesion is 95% stenosed.    Lateral Ramus Intermedius  Lat Ramus lesion is 90% stenosed.    Left Circumflex  Prox Cx to Mid Cx lesion is 100% stenosed.    Right Coronary Artery  Prox RCA lesion is 95% stenosed. The lesion is eccentric. The lesion is calcified.  Mid RCA lesion is 60% stenosed.    Intervention   No interventions have been documented.   Wall Motion     The following segments are hypokinetic:  mid inferior and basilar inferior. The following segments are normal: mid anterior, basilar anterior, apical anterior and apical inferior.           Left Heart  Left Ventricle The left ventricular size is normal. There is mild left ventricular systolic dysfunction. LV end diastolic pressure is normal. The left ventricular  ejection fraction is 50-55% by visual estimate. There are LV function abnormalities due to segmental dysfunction.   Implants     No implant documentation for this case.   [employer] Images   Show images for CARDIAC CATHETERIZATION Images on [employer] Term Storage   Show images for Lyrick, [place] to Procedure Log  Procedure Log    Hemo Data  Flowsheet Row Most Recent Value  AO Systolic Pressure 99 mmHg  AO Diastolic Pressure 44 mmHg  AO Mean 66 mmHg  LV Systolic Pressure 308 mmHg  LV Diastolic Pressure 1 mmHg  LV EDP 10 mmHg  AOp Systolic Pressure 657 mmHg  AOp Diastolic Pressure 46 mmHg  AOp Mean Pressure 68 mmHg  LVp Systolic Pressure 846 mmHg  LVp Diastolic Pressure 0 mmHg  LVp EDP Pressure 11 mmHg       ECHOCARDIOGRAM REPORT         Patient Name:   Adam [name] Date of Exam: 06/07/2021  Medical Rec #:  [id number]      Height:       70.0 in  Accession #:    [id number]     Weight:       145.5 lb  Date of Birth:  15-Aug-1940      BSA:          1.823 m  Patient Age:    19 years       BP:           123/87 mmHg  Patient Gender: M              HR:           55 bpm.  Exam Location:  Inpatient   Procedure: 2D Echo, Cardiac Doppler, Color Doppler and Intracardiac             Opacification Agent   Indications:    CAD     History:        Patient has no prior history of Echocardiogram  examinations.                  CAD.     Sonographer:    Merrie Roof RDCS  Referring Phys: [phone] FAN YE   IMPRESSIONS     1. Left ventricular ejection fraction, by estimation, is 55 to 60%. The  left ventricle has normal function. The left ventricle has no regional  wall motion abnormalities. Left ventricular diastolic parameters were  normal.   2. Right ventricular systolic function is normal. The right ventricular  size is normal.   3. The mitral valve is normal in structure. Mild mitral valve  regurgitation.   4. The aortic valve is tricuspid. Aortic valve regurgitation is not   visualized. No aortic stenosis is present.   5. The inferior vena cava is normal in size with greater than 50%  respiratory variability, suggesting right atrial pressure of 3 mmHg.   FINDINGS   Left Ventricle: Left ventricular ejection fraction, by estimation, is 55  to 60%. The left ventricle has normal function. The left ventricle has no  regional wall motion abnormalities. Definity contrast agent was given IV  to delineate  the left ventricular   endocardial borders. The left ventricular internal cavity size was normal  in size. There is no left ventricular hypertrophy. Left ventricular  diastolic parameters were normal.   Right Ventricle: The right ventricular size is normal. No increase in  right ventricular wall thickness. Right ventricular systolic function is  normal.   Left Atrium: Left atrial size was normal in size.   Right Atrium: Right atrial size was normal in size.   Pericardium: There is no evidence of pericardial effusion.   Mitral Valve: The mitral valve is normal in structure. Mild mitral valve  regurgitation.   Tricuspid Valve: The tricuspid valve is normal in structure. Tricuspid  valve regurgitation is trivial.   Aortic Valve: The aortic valve is tricuspid. Aortic valve regurgitation is  not visualized. No aortic stenosis is present. Aortic valve mean gradient  measures 2.0 mmHg. Aortic valve peak gradient measures 4.2 mmHg. Aortic  valve area, by VTI measures 2.30  cm.   Pulmonic Valve: The pulmonic valve was not well visualized. Pulmonic valve  regurgitation is trivial.   Aorta: The aortic root and ascending aorta are structurally normal, with  no evidence of dilitation.   Venous: The inferior vena cava is normal in size with greater than 50%  respiratory variability, suggesting right atrial pressure of 3 mmHg.   IAS/Shunts: The interatrial septum was not well visualized.      LEFT VENTRICLE  PLAX 2D  LVIDd:         4.40 cm   Diastology   LVIDs:         3.30 cm   LV e' medial:    6.42 cm/s  LV PW:         1.00 cm   LV E/e' medial:  9.9  LV IVS:        0.80 cm   LV e' lateral:   10.70 cm/s  LVOT diam:     2.00 cm   LV E/e' lateral: 5.9  LV SV:         48  LV SV Index:   26  LVOT Area:     3.14 cm      RIGHT VENTRICLE          IVC  RV Basal diam:  3.80 cm  IVC diam: 1.80 cm   LEFT ATRIUM             Index        RIGHT ATRIUM           Index  LA diam:        3.80 cm 2.08 cm/m   RA Area:     17.80 cm  LA Vol (A2C):   54.6 ml 29.95 ml/m  RA Volume:   51.10 ml  28.03 ml/m  LA Vol (A4C):   41.9 ml 22.98 ml/m  LA Biplane Vol: 51.7 ml 28.35 ml/m   AORTIC VALVE  AV Area (Vmax):    2.17 cm  AV Area (Vmean):   2.13 cm  AV Area (VTI):     2.30 cm  AV Vmax:           103.00 cm/s  AV Vmean:          65.000 cm/s  AV VTI:            0.209 m  AV Peak Grad:      4.2 mmHg  AV Mean Grad:      2.0 mmHg  LVOT Vmax:  71.00 cm/s  LVOT Vmean:        44.000 cm/s  LVOT VTI:          0.153 m  LVOT/AV VTI ratio: 0.73     AORTA  Ao Root diam: 3.50 cm  Ao Asc diam:  3.00 cm   MITRAL VALVE  MV Area (PHT): 3.42 cm    SHUNTS  MV Decel Time: 222 msec    Systemic VTI:  0.15 m  MV E velocity: 63.60 cm/s  Systemic Diam: 2.00 cm  MV A velocity: 62.20 cm/s  MV E/A ratio:  1.02   Oswaldo Milian MD  Electronically signed by Oswaldo Milian MD  Signature Date/Time: 06/07/2021/3:45:08 PM         Final     Assessment/Plan:  This 80 year old gentleman has severe three-vessel coronary disease with high-grade multisegment LAD stenoses, severe bifurcation stenosis involving the ramus branch with 90 to 95% lesions in both subbranches, and a severe proximal RCA stenosis with moderate RCA stenosis and an area of tortuosity.  He presented with a sizable NSTEMI although his left ventricular function is well-preserved.  I agree that coronary artery bypass graft surgery is the best treatment for his coronary disease.  He is  42 but fairly healthy overall and still active.  He has never smoked and has normal kidney function.  I had a long discussion with him and his son conferenced in on telephone about surgical treatment, benefits and risks.  They are going to think about it.  I told him that Dr. Roxan Hockey could potentially do his surgery on Monday but I would not be able to do his surgery next week.  Gaye Pollack 06/07/2021, 4:58 PM

## 2021-06-07 NOTE — Consult Note (Signed)
[address]       [place] [address]             [phone]      Cardiothoracic Surgery Consultation  Reason for Consult: Severe multivessel coronary disease Referring Physician: Dr. Sherren Mocha  NIKITA HUMBLE is an 80 y.o. male.  HPI:   The patient is a 80 year old gentleman with a history of hyperlipidemia, diet-controlled diabetes, prior stroke symptoms in the right eye with right carotid endarterectomy and resolution of his symptoms, he reports being asymptomatic until he woke up this past Wednesday with aching pain in his back that he rated a 6/10 in severity.  He said that he got up and stretched and the pain went away and he went back to bed.  The following day he was lifting logs from his front porch and carrying them inside with his arms over his head.  He developed the same back pain as well as pain in both upper extremities which improved with rest.  He also developed some mild shortness of breath and nausea and presented to [facility] for evaluation.  His initial high-sensitivity troponin was 2952 and increased to greater than 24,000.  He ruled in for a NSTEMI.  He underwent cardiac catheterization yesterday which showed severe multivessel coronary disease with left ventricular ejection fraction of 50 to 55%.  His disease not felt to be ideal for PCI with a high-grade ostial LAD stenosis, bifurcation disease of the ramus branch, and multivessel coronary obstruction.  A 2D echo today showed an ejection fraction of 55 to 60% with no regional wall motion abnormality.  There is mild mitral valve regurgitation.  There is no aortic stenosis or insufficiency.  He has had no further chest or back discomfort since admission.  The patient is married and lives at home with his wife who has stage IV breast cancer.  He provides most of her care.  He has a son and daughter who live close by and are helping him out.  He is a lifelong non-smoker and does not drink  alcohol.  Past Medical History:  Diagnosis Date   Borderline diabetic    DIET CONTROLLED   History of kidney stones    Hyperlipemia    Paratesticular mass    PONV (postoperative nausea and vomiting)    Stroke ([place])    Right Eye / VISUAL PROBLEM RESOLVED AFTER ENDARTERECTOMY    Past Surgical History:  Procedure Laterality Date   APPENDECTOMY  07/1972   [place]   CAROTID ENDARTERECTOMY  10/03/10   [facility]   CATARACT EXTRACTION W/PHACO  01/26/2011   Procedure: CATARACT EXTRACTION PHACO AND INTRAOCULAR LENS PLACEMENT ([place]);  Surgeon: Williams Che;  Location: [facility];  Service: Ophthalmology;  Laterality: Right;  CDE:8.64   CATARACT EXTRACTION W/PHACO Left 02/12/2014   Procedure: CATARACT EXTRACTION PHACO AND INTRAOCULAR LENS PLACEMENT ([facility]);  Surgeon: Williams Che, MD;  Location: [facility];  Service: Ophthalmology;  Laterality: Left;  CDE 6.25   CYSTOSCOPY  1970   [place]   EYE SURGERY  06/30/2010   detached retina, [facility]   EYE SURGERY Left Nov 26, 2014   Detached Retina   HYDROCELE EXCISION Left 09/30/2015   Procedure: HYDROCELECTOMY ADULT;  Surgeon: Cleon Gustin, MD;  Location: [facility];  Service: Urology;  Laterality: Left;   IRRIGATION AND DEBRIDEMENT SEBACEOUS CYST  2001, 2007   [place], [place], in MD office   LEFT HEART CATH AND CORONARY  ANGIOGRAPHY N/A 06/06/2021   Procedure: LEFT HEART CATH AND CORONARY ANGIOGRAPHY;  Surgeon: Sherren Mocha, MD;  Location: [facility];  Service: Cardiovascular;  Laterality: N/A;   SKIN CANCER EXCISION  2010   pre cancerous tissue:nose, scalp, DR. Hall's office   TESTICULAR EXPLORATION Left 09/30/2015   Procedure: INGUINAL PARATESTICULAR  MASS REMOVAL;  Surgeon: Cleon Gustin, MD;  Location: [facility];  Service: Urology;  Laterality: Left;   YAG LASER APPLICATION Right 03/22/3715   Procedure: YAG LASER APPLICATION;  Surgeon: Williams Che, MD;  Location: [facility];  Service: Ophthalmology;  Laterality: Right;    Family History  Problem  Relation Age of Onset   Hypertension Father    Heart disease Father        After age 51   Anesthesia problems Neg Hx    Hypotension Neg Hx    Malignant hyperthermia Neg Hx    Pseudochol deficiency Neg Hx     Social History:  reports that he has never smoked. He has never used smokeless tobacco. He reports that he does not drink alcohol and does not use drugs.  Allergies: No Known Allergies  Medications: I have reviewed the patient's current medications. Prior to Admission:  Medications Prior to Admission  Medication Sig Dispense Refill Last Dose   aspirin EC 81 MG tablet Take 81 mg by mouth daily as needed (blood thinner). Swallow whole.   Past Week   brimonidine (ALPHAGAN) 0.2 % ophthalmic solution INSTILL 1 DROP INTO RIGHT EYE TWICE DAILY 5 mL 0 06/04/2021   Cholecalciferol (VITAMIN D3 PO) Take 1 tablet by mouth daily.   06/03/2021   Chromium 1 MG CAPS Take 1 mg by mouth daily.   06/03/2021   ibuprofen (ADVIL,MOTRIN) 200 MG tablet Take 200 mg by mouth every 8 (eight) hours as needed. For pain    unknown   latanoprost (XALATAN) 0.005 % ophthalmic solution Place 1 drop into both eyes at bedtime.   06/03/2021   MAGNESIUM PO Take 250 mg by mouth once a week.    Past Week   timolol (TIMOPTIC) 0.5 % ophthalmic solution INSTILL 1 DROP INTO RIGHT EYE ONCE DAILY (Patient taking differently: Place 1 drop into the right eye 2 (two) times daily.) 5 mL 0 06/03/2021   VANADIUM PO Take 1 tablet by mouth daily.   06/03/2021   aspirin 325 MG EC tablet Take 162.5 mg by mouth daily. (Patient not taking: Reported on 06/04/2021)   Not Taking   HYDROcodone-acetaminophen (NORCO) 5-325 MG tablet Take 1 tablet by mouth every 6 (six) hours as needed for moderate pain. (Patient not taking: Reported on 01/10/2016) 30 tablet 0 Not Taking   niacin 500 MG tablet Take 500 mg by mouth once a week. (Patient not taking: Reported on 06/04/2021)   Not Taking   timolol (BETIMOL) 0.5 % ophthalmic solution Place 1 drop into  the right eye daily.  (Patient not taking: Reported on 06/04/2021)   Not Taking   Scheduled:  aspirin EC  81 mg Oral Daily   atorvastatin  80 mg Oral Daily   brimonidine  1 drop Right Eye BID   carvedilol  3.125 mg Oral BID WC   latanoprost  1 drop Both Eyes QHS   sodium chloride flush  3 mL Intravenous Q12H   timolol  1 drop Right Eye BID   Continuous:  sodium chloride     heparin 1,100 Units/hr (06/07/21 1602)   RCV:ELFYBO chloride, acetaminophen, nitroGLYCERIN, ondansetron (ZOFRAN) IV, perflutren  lipid microspheres (DEFINITY) IV suspension, sodium chloride flush, zolpidem Anti-infectives (From admission, onward)    None       Results for orders placed or performed during the hospital encounter of 06/04/21 (from the past 48 hour(s))  Heparin level (unfractionated)     Status: None   Collection Time: 06/06/21  2:26 AM  Result Value Ref Range   Heparin Unfractionated 0.65 0.30 - 0.70 IU/mL    Comment: (NOTE) The clinical reportable range upper limit is being lowered to >1.10 to align with the FDA approved guidance for the current laboratory assay.  If heparin results are below expected values, and patient dosage has  been confirmed, suggest follow up testing of antithrombin III levels. Performed at [facility], [place] [address], [place], [place] [address]   CBC     Status: Abnormal   Collection Time: 06/06/21  2:26 AM  Result Value Ref Range   WBC 7.4 4.0 - 10.5 K/uL   RBC 3.46 (L) 4.22 - 5.81 MIL/uL   Hemoglobin 10.9 (L) 13.0 - 17.0 g/dL   HCT 33.5 (L) 39.0 - 52.0 %   MCV 96.8 80.0 - 100.0 fL   MCH 31.5 26.0 - 34.0 pg   MCHC 32.5 30.0 - 36.0 g/dL   RDW 12.2 11.5 - 15.5 %   Platelets 157 150 - 400 K/uL   nRBC 0.0 0.0 - 0.2 %    Comment: Performed at [facility], [place] [address] [place], [place] [address]  CBC     Status: Abnormal   Collection Time: 06/07/21  6:29 AM  Result Value Ref Range   WBC 6.4 4.0 - 10.5 K/uL   RBC 3.89 (L) 4.22 - 5.81 MIL/uL    Hemoglobin 12.2 (L) 13.0 - 17.0 g/dL   HCT 37.4 (L) 39.0 - 52.0 %   MCV 96.1 80.0 - 100.0 fL   MCH 31.4 26.0 - 34.0 pg   MCHC 32.6 30.0 - 36.0 g/dL   RDW 12.1 11.5 - 15.5 %   Platelets 188 150 - 400 K/uL   nRBC 0.0 0.0 - 0.2 %    Comment: Performed at [facility], [place] [address], [place], [place] [address]  Heparin level (unfractionated)     Status: None   Collection Time: 06/07/21  6:29 AM  Result Value Ref Range   Heparin Unfractionated 0.33 0.30 - 0.70 IU/mL    Comment: (NOTE) The clinical reportable range upper limit is being lowered to >1.10 to align with the FDA approved guidance for the current laboratory assay.  If heparin results are below expected values, and patient dosage has  been confirmed, suggest follow up testing of antithrombin III levels. Performed at [facility], [place] [address] [place], [place] [address]   Basic metabolic panel     Status: Abnormal   Collection Time: 06/07/21  6:29 AM  Result Value Ref Range   Sodium 138 135 - 145 mmol/L   Potassium 3.9 3.5 - 5.1 mmol/L   Chloride 106 98 - 111 mmol/L   CO2 24 22 - 32 mmol/L   Glucose, Bld 118 (H) 70 - 99 mg/dL    Comment: Glucose reference range applies only to samples taken after fasting for at least 8 hours.   BUN 14 8 - 23 mg/dL   Creatinine, Ser 0.89 0.61 - 1.24 mg/dL   Calcium 9.2 8.9 - 10.3 mg/dL   GFR, Estimated >60 >60 mL/min    Comment: (NOTE) Calculated using  the CKD-EPI Creatinine Equation (2021)    Anion gap 8 5 - 15    Comment: Performed at [facility], [place] [address], [place], [place] [address]    CARDIAC CATHETERIZATION  Result Date: 06/06/2021   Dist LM lesion is 30% stenosed.   Ost LAD lesion is 70% stenosed.   Prox LAD to Mid LAD lesion is 90% stenosed.   Mid LAD to Dist LAD lesion is 90% stenosed.   Prox Cx to Mid Cx lesion is 100% stenosed.   Ramus lesion is 95% stenosed.   Lat Ramus lesion is 90% stenosed.   Prox RCA lesion is 95% stenosed.   Mid RCA  lesion is 60% stenosed.   There is mild left ventricular systolic dysfunction.   LV end diastolic pressure is normal.   The left ventricular ejection fraction is 50-55% by visual estimate. 1.  Mild distal left main stenosis 2.  Severe multisegment LAD stenoses with 70 to 75% ostial stenosis, 95% proximal stenosis, and 95% mid vessel stenosis 3.  Severe bifurcation stenosis involving the intermediate branch with 90 to 95% lesions in both sub-branches 4.  Total occlusion of the mid circumflex at a small first OM branch. 5.  Severe proximal stenosis of the RCA and moderate stenosis of the mid RCA and an area of tortuosity 6.  Mild segmental LV dysfunction with inferior wall hypokinesis, LVEF estimated at approximately 50 to 55%. Recommendations: T CTS consultation for consideration of multivessel CABG.  If the patient is not a candidate for CABG, could consider multivessel PCI as an alternative. Anatomy not favorable for PCI with ostial LAD stenosis, bifurcation disease of the ramus, and large extent of obstructive disease.   ECHOCARDIOGRAM COMPLETE  Result Date: 06/07/2021    ECHOCARDIOGRAM REPORT   Patient Name:   HERALD VALLIN Caamano Date of Exam: 06/07/2021 Medical Rec #:  [id number]      Height:       70.0 in Accession #:    [id number]     Weight:       145.5 lb Date of Birth:  September 17, 1940      BSA:          1.823 m Patient Age:    89 years       BP:           123/87 mmHg Patient Gender: M              HR:           55 bpm. Exam Location:  Inpatient Procedure: 2D Echo, Cardiac Doppler, Color Doppler and Intracardiac            Opacification Agent Indications:    CAD  History:        Patient has no prior history of Echocardiogram examinations.                 CAD.  Sonographer:    Merrie Roof RDCS Referring Phys: [phone] FAN YE IMPRESSIONS  1. Left ventricular ejection fraction, by estimation, is 55 to 60%. The left ventricle has normal function. The left ventricle has no regional wall motion abnormalities. Left  ventricular diastolic parameters were normal.  2. Right ventricular systolic function is normal. The right ventricular size is normal.  3. The mitral valve is normal in structure. Mild mitral valve regurgitation.  4. The aortic valve is tricuspid. Aortic valve regurgitation is not visualized. No aortic stenosis is present.  5. The inferior vena cava is normal in size  with greater than 50% respiratory variability, suggesting right atrial pressure of 3 mmHg. FINDINGS  Left Ventricle: Left ventricular ejection fraction, by estimation, is 55 to 60%. The left ventricle has normal function. The left ventricle has no regional wall motion abnormalities. Definity contrast agent was given IV to delineate the left ventricular  endocardial borders. The left ventricular internal cavity size was normal in size. There is no left ventricular hypertrophy. Left ventricular diastolic parameters were normal. Right Ventricle: The right ventricular size is normal. No increase in right ventricular wall thickness. Right ventricular systolic function is normal. Left Atrium: Left atrial size was normal in size. Right Atrium: Right atrial size was normal in size. Pericardium: There is no evidence of pericardial effusion. Mitral Valve: The mitral valve is normal in structure. Mild mitral valve regurgitation. Tricuspid Valve: The tricuspid valve is normal in structure. Tricuspid valve regurgitation is trivial. Aortic Valve: The aortic valve is tricuspid. Aortic valve regurgitation is not visualized. No aortic stenosis is present. Aortic valve mean gradient measures 2.0 mmHg. Aortic valve peak gradient measures 4.2 mmHg. Aortic valve area, by VTI measures 2.30 cm. Pulmonic Valve: The pulmonic valve was not well visualized. Pulmonic valve regurgitation is trivial. Aorta: The aortic root and ascending aorta are structurally normal, with no evidence of dilitation. Venous: The inferior vena cava is normal in size with greater than 50% respiratory  variability, suggesting right atrial pressure of 3 mmHg. IAS/Shunts: The interatrial septum was not well visualized.  LEFT VENTRICLE PLAX 2D LVIDd:         4.40 cm   Diastology LVIDs:         3.30 cm   LV e' medial:    6.42 cm/s LV PW:         1.00 cm   LV E/e' medial:  9.9 LV IVS:        0.80 cm   LV e' lateral:   10.70 cm/s LVOT diam:     2.00 cm   LV E/e' lateral: 5.9 LV SV:         48 LV SV Index:   26 LVOT Area:     3.14 cm  RIGHT VENTRICLE          IVC RV Basal diam:  3.80 cm  IVC diam: 1.80 cm LEFT ATRIUM             Index        RIGHT ATRIUM           Index LA diam:        3.80 cm 2.08 cm/m   RA Area:     17.80 cm LA Vol (A2C):   54.6 ml 29.95 ml/m  RA Volume:   51.10 ml  28.03 ml/m LA Vol (A4C):   41.9 ml 22.98 ml/m LA Biplane Vol: 51.7 ml 28.35 ml/m  AORTIC VALVE AV Area (Vmax):    2.17 cm AV Area (Vmean):   2.13 cm AV Area (VTI):     2.30 cm AV Vmax:           103.00 cm/s AV Vmean:          65.000 cm/s AV VTI:            0.209 m AV Peak Grad:      4.2 mmHg AV Mean Grad:      2.0 mmHg LVOT Vmax:         71.00 cm/s LVOT Vmean:        44.000 cm/s LVOT  VTI:          0.153 m LVOT/AV VTI ratio: 0.73  AORTA Ao Root diam: 3.50 cm Ao Asc diam:  3.00 cm MITRAL VALVE MV Area (PHT): 3.42 cm    SHUNTS MV Decel Time: 222 msec    Systemic VTI:  0.15 m MV E velocity: 63.60 cm/s  Systemic Diam: 2.00 cm MV A velocity: 62.20 cm/s MV E/A ratio:  1.02 Oswaldo Milian MD Electronically signed by Oswaldo Milian MD Signature Date/Time: 06/07/2021/3:45:08 PM    Final     Review of Systems  Constitutional:  Negative for activity change and fatigue.  HENT: Negative.    Eyes: Negative.   Respiratory:  Positive for shortness of breath. Negative for chest tightness.   Cardiovascular:  Negative for chest pain, palpitations and leg swelling.  Gastrointestinal:  Positive for nausea.  Endocrine: Negative.   Genitourinary: Negative.   Musculoskeletal:  Positive for back pain.  Skin: Negative.    Allergic/Immunologic: Negative.   Neurological:  Negative for dizziness and syncope.  Hematological: Negative.   Psychiatric/Behavioral: Negative.    Blood pressure 136/69, pulse 63, temperature 97.7 F (36.5 C), temperature source Oral, resp. rate 18, height 5\' 10"  (1.778 m), weight 66 kg, SpO2 98 %. Physical Exam Constitutional:      Appearance: He is well-developed.     Comments: Thin elderly gentleman in no distress  HENT:     Head: Normocephalic and atraumatic.  Eyes:     Extraocular Movements: Extraocular movements intact.     Conjunctiva/sclera: Conjunctivae normal.     Pupils: Pupils are equal, round, and reactive to light.  Neck:     Vascular: No carotid bruit.  Cardiovascular:     Rate and Rhythm: Normal rate and regular rhythm.     Pulses: Normal pulses.     Heart sounds: Normal heart sounds. No murmur heard. Pulmonary:     Effort: Pulmonary effort is normal.     Breath sounds: Normal breath sounds.  Abdominal:     General: Abdomen is flat. Bowel sounds are normal. There is no distension.     Palpations: Abdomen is soft.     Tenderness: There is no abdominal tenderness.  Musculoskeletal:        General: No swelling. Normal range of motion.     Cervical back: Normal range of motion and neck supple.  Skin:    General: Skin is warm and dry.  Neurological:     General: No focal deficit present.     Mental Status: He is alert and oriented to person, place, and time.  Psychiatric:        Mood and Affect: Mood normal.        Behavior: Behavior normal.   Physicians  Panel Physicians Referring Physician Case Authorizing Physician  Sherren Mocha, MD (Primary)     Procedures  LEFT HEART CATH AND CORONARY ANGIOGRAPHY   Conclusion      Dist LM lesion is 30% stenosed.   Ost LAD lesion is 70% stenosed.   Prox LAD to Mid LAD lesion is 90% stenosed.   Mid LAD to Dist LAD lesion is 90% stenosed.   Prox Cx to Mid Cx lesion is 100% stenosed.   Ramus lesion is 95%  stenosed.   Lat Ramus lesion is 90% stenosed.   Prox RCA lesion is 95% stenosed.   Mid RCA lesion is 60% stenosed.   There is mild left ventricular systolic dysfunction.   LV end diastolic pressure is normal.  The left ventricular ejection fraction is 50-55% by visual estimate.   1.  Mild distal left main stenosis 2.  Severe multisegment LAD stenoses with 70 to 75% ostial stenosis, 95% proximal stenosis, and 95% mid vessel stenosis 3.  Severe bifurcation stenosis involving the intermediate branch with 90 to 95% lesions in both sub-branches 4.  Total occlusion of the mid circumflex at a small first OM branch. 5.  Severe proximal stenosis of the RCA and moderate stenosis of the mid RCA and an area of tortuosity 6.  Mild segmental LV dysfunction with inferior wall hypokinesis, LVEF estimated at approximately 50 to 55%.   Recommendations: T CTS consultation for consideration of multivessel CABG.  If the patient is not a candidate for CABG, could consider multivessel PCI as an alternative. Anatomy not favorable for PCI with ostial LAD stenosis, bifurcation disease of the ramus, and large extent of obstructive disease.    Procedural Details  Technical Details INDICATION: NSTEMI.  No past cardiac history.  The patient presents with severe chest pain and is found to have a troponin greater than 24,000.  He is referred for cardiac catheterization and possible PCI.  The patient has been chest pain-free since hospitalized on IV nitroglycerin and heparin.  PROCEDURAL DETAILS: The right wrist is prepped, draped, and anesthetized with 1% lidocaine. Using the modified Seldinger technique, a 5/6 French Slender sheath is introduced into the right radial artery. 3 mg of verapamil is administered through the sheath, weight-based unfractionated heparin was administered intravenously. Standard Judkins catheters are used for selective coronary angiography. LV pressure is recorded and an aortic valve pullback  gradient is measured.  Left ventriculography is performed with hand-injection.  Catheter exchanges are performed over an exchange length guidewire. There are no immediate procedural complications. A TR band is used for radial hemostasis at the completion of the procedure.  The patient was transferred to the post catheterization recovery area for further monitoring.      Estimated blood loss <50 mL.   During this procedure medications were administered to achieve and maintain moderate conscious sedation while the patient's heart rate, blood pressure, and oxygen saturation were continuously monitored and I was present face-to-face 100% of this time.   Medications (Filter: Administrations occurring from 1159 to 1312 on 06/06/21)  important  Continuous medications are totaled by the amount administered until 06/06/21 1312.   Heparin (Porcine) in NaCl 1000-0.9 UT/500ML-% SOLN (mL) Total volume:  1,000 mL Date/Time Rate/Dose/Volume Action   06/06/21 1207 500 mL Given   1207 500 mL Given    midazolam (VERSED) injection (mg) Total dose:  2 mg Date/Time Rate/Dose/Volume Action   06/06/21 1233 2 mg Given    fentaNYL (SUBLIMAZE) injection (mcg) Total dose:  50 mcg Date/Time Rate/Dose/Volume Action   06/06/21 1234 50 mcg Given    lidocaine (PF) (XYLOCAINE) 1 % injection (mL) Total volume:  2 mL Date/Time Rate/Dose/Volume Action   06/06/21 1242 2 mL Given    Radial Cocktail/Verapamil only (mL) Total volume:  10 mL Date/Time Rate/Dose/Volume Action   06/06/21 1243 10 mL Given    heparin sodium (porcine) injection (Units) Total dose:  4,000 Units Date/Time Rate/Dose/Volume Action   06/06/21 1245 4,000 Units Given    iohexol (OMNIPAQUE) 350 MG/ML injection (mL) Total volume:  50 mL Date/Time Rate/Dose/Volume Action   06/06/21 1300 50 mL Given    acetaminophen (TYLENOL) tablet 650 mg (mg) Total dose:  Cannot be calculated* Dosing weight:  66 *Administration dose not  documented Date/Time Rate/Dose/Volume Action  06/06/21 1159 *Not included in total MAR Hold    aspirin EC tablet 81 mg (mg) Total dose:  Cannot be calculated* Dosing weight:  66 *Administration dose not documented Date/Time Rate/Dose/Volume Action   06/06/21 1159 *Not included in total MAR Hold    atorvastatin (LIPITOR) tablet 80 mg (mg) Total dose:  Cannot be calculated* Dosing weight:  66 *Administration dose not documented Date/Time Rate/Dose/Volume Action   06/06/21 1159 *Not included in total MAR Hold    brimonidine (ALPHAGAN) 0.2 % ophthalmic solution 1 drop (drop) Total dose:  Cannot be calculated* *Administration dose not documented Date/Time Rate/Dose/Volume Action   06/06/21 1159 *Not included in total MAR Hold    carvedilol (COREG) tablet 3.125 mg (mg) Total dose:  Cannot be calculated* Dosing weight:  66 *Administration dose not documented Date/Time Rate/Dose/Volume Action   06/06/21 1159 *Not included in total MAR Hold    latanoprost (XALATAN) 0.005 % ophthalmic solution 1 drop (drop) Total dose:  Cannot be calculated* Dosing weight:  66 *Administration dose not documented Date/Time Rate/Dose/Volume Action   06/06/21 1159 *Not included in total MAR Hold    nitroGLYCERIN (NITROSTAT) SL tablet 0.4 mg (mg) Total dose:  Cannot be calculated* *Administration dose not documented Date/Time Rate/Dose/Volume Action   06/06/21 1159 *Not included in total MAR Hold    ondansetron (ZOFRAN) injection 4 mg (mg) Total dose:  Cannot be calculated* Dosing weight:  66 *Administration dose not documented Date/Time Rate/Dose/Volume Action   06/06/21 1159 *Not included in total MAR Hold    timolol (TIMOPTIC) 0.5 % ophthalmic solution 1 drop (drop) Total dose:  Cannot be calculated* *Administration dose not documented Date/Time Rate/Dose/Volume Action   06/06/21 1159 *Not included in total MAR Hold    zolpidem (AMBIEN) tablet 5 mg (mg) Total dose:  Cannot be calculated*  Dosing weight:  66 *Administration dose not documented Date/Time Rate/Dose/Volume Action   06/06/21 1159 *Not included in total MAR Hold    nitroGLYCERIN 50 mg in dextrose 5 % 250 mL (0.2 mg/mL) infusion (mcg/min) Total dose:  Cannot be calculated* Dosing weight:  73 *Administration dose not documented Date/Time Rate/Dose/Volume Action   06/06/21 1159 *Not included in total MAR Hold   1244  Stopped    Sedation Time  Sedation Time Physician-1: 23 minutes 20 seconds Contrast  Medication Name Total Dose  iohexol (OMNIPAQUE) 350 MG/ML injection 50 mL   Radiation/Fluoro  Fluoro time: 2.5 (min) DAP: 10358 (mGycm2) Cumulative Air Kerma: 697 (mGy) Complications  Complications documented before study signed (06/06/2021  9:48 PM)   No complications were associated with this study.  Documented by Garnette Scheuermann, RT - 06/06/2021 12:58 PM     Coronary Findings  Diagnostic Dominance: Right Left Main  Dist LM lesion is 30% stenosed.    Left Anterior Descending  Ost LAD lesion is 70% stenosed.  Prox LAD to Mid LAD lesion is 90% stenosed. The lesion is calcified.  Mid LAD to Dist LAD lesion is 90% stenosed.    Ramus Intermedius  Ramus lesion is 95% stenosed.    Lateral Ramus Intermedius  Lat Ramus lesion is 90% stenosed.    Left Circumflex  Prox Cx to Mid Cx lesion is 100% stenosed.    Right Coronary Artery  Prox RCA lesion is 95% stenosed. The lesion is eccentric. The lesion is calcified.  Mid RCA lesion is 60% stenosed.    Intervention   No interventions have been documented.   Wall Motion     The following segments are hypokinetic:  mid inferior and basilar inferior. The following segments are normal: mid anterior, basilar anterior, apical anterior and apical inferior.           Left Heart  Left Ventricle The left ventricular size is normal. There is mild left ventricular systolic dysfunction. LV end diastolic pressure is normal. The left ventricular  ejection fraction is 50-55% by visual estimate. There are LV function abnormalities due to segmental dysfunction.   Implants     No implant documentation for this case.   Syngo Images   Show images for CARDIAC CATHETERIZATION Images on Long Term Storage   Show images for [place], [place] to Procedure Log  Procedure Log    Hemo Data  Flowsheet Row Most Recent Value  AO Systolic Pressure 99 mmHg  AO Diastolic Pressure 44 mmHg  AO Mean 66 mmHg  LV Systolic Pressure 756 mmHg  LV Diastolic Pressure 1 mmHg  LV EDP 10 mmHg  AOp Systolic Pressure 433 mmHg  AOp Diastolic Pressure 46 mmHg  AOp Mean Pressure 68 mmHg  LVp Systolic Pressure 295 mmHg  LVp Diastolic Pressure 0 mmHg  LVp EDP Pressure 11 mmHg       ECHOCARDIOGRAM REPORT         Patient Name:   Adam [name] Date of Exam: 06/07/2021  Medical Rec #:  [id number]      Height:       70.0 in  Accession #:    [id number]     Weight:       145.5 lb  Date of Birth:  [birth year]      BSA:          1.823 m  Patient Age:    13 years       BP:           123/87 mmHg  Patient Gender: M              HR:           55 bpm.  Exam Location:  Inpatient   Procedure: 2D Echo, Cardiac Doppler, Color Doppler and Intracardiac             Opacification Agent   Indications:    CAD     History:        Patient has no prior history of Echocardiogram  examinations.                  CAD.     Sonographer:    Merrie Roof RDCS  Referring Phys: [phone] FAN YE   IMPRESSIONS     1. Left ventricular ejection fraction, by estimation, is 55 to 60%. The  left ventricle has normal function. The left ventricle has no regional  wall motion abnormalities. Left ventricular diastolic parameters were  normal.   2. Right ventricular systolic function is normal. The right ventricular  size is normal.   3. The mitral valve is normal in structure. Mild mitral valve  regurgitation.   4. The aortic valve is tricuspid. Aortic valve regurgitation is not   visualized. No aortic stenosis is present.   5. The inferior vena cava is normal in size with greater than 50%  respiratory variability, suggesting right atrial pressure of 3 mmHg.   FINDINGS   Left Ventricle: Left ventricular ejection fraction, by estimation, is 55  to 60%. The left ventricle has normal function. The left ventricle has no  regional wall motion abnormalities. Definity contrast agent was given IV  to delineate  the left ventricular   endocardial borders. The left ventricular internal cavity size was normal  in size. There is no left ventricular hypertrophy. Left ventricular  diastolic parameters were normal.   Right Ventricle: The right ventricular size is normal. No increase in  right ventricular wall thickness. Right ventricular systolic function is  normal.   Left Atrium: Left atrial size was normal in size.   Right Atrium: Right atrial size was normal in size.   Pericardium: There is no evidence of pericardial effusion.   Mitral Valve: The mitral valve is normal in structure. Mild mitral valve  regurgitation.   Tricuspid Valve: The tricuspid valve is normal in structure. Tricuspid  valve regurgitation is trivial.   Aortic Valve: The aortic valve is tricuspid. Aortic valve regurgitation is  not visualized. No aortic stenosis is present. Aortic valve mean gradient  measures 2.0 mmHg. Aortic valve peak gradient measures 4.2 mmHg. Aortic  valve area, by VTI measures 2.30  cm.   Pulmonic Valve: The pulmonic valve was not well visualized. Pulmonic valve  regurgitation is trivial.   Aorta: The aortic root and ascending aorta are structurally normal, with  no evidence of dilitation.   Venous: The inferior vena cava is normal in size with greater than 50%  respiratory variability, suggesting right atrial pressure of 3 mmHg.   IAS/Shunts: The interatrial septum was not well visualized.      LEFT VENTRICLE  PLAX 2D  LVIDd:         4.40 cm   Diastology   LVIDs:         3.30 cm   LV e' medial:    6.42 cm/s  LV PW:         1.00 cm   LV E/e' medial:  9.9  LV IVS:        0.80 cm   LV e' lateral:   10.70 cm/s  LVOT diam:     2.00 cm   LV E/e' lateral: 5.9  LV SV:         48  LV SV Index:   26  LVOT Area:     3.14 cm      RIGHT VENTRICLE          IVC  RV Basal diam:  3.80 cm  IVC diam: 1.80 cm   LEFT ATRIUM             Index        RIGHT ATRIUM           Index  LA diam:        3.80 cm 2.08 cm/m   RA Area:     17.80 cm  LA Vol (A2C):   54.6 ml 29.95 ml/m  RA Volume:   51.10 ml  28.03 ml/m  LA Vol (A4C):   41.9 ml 22.98 ml/m  LA Biplane Vol: 51.7 ml 28.35 ml/m   AORTIC VALVE  AV Area (Vmax):    2.17 cm  AV Area (Vmean):   2.13 cm  AV Area (VTI):     2.30 cm  AV Vmax:           103.00 cm/s  AV Vmean:          65.000 cm/s  AV VTI:            0.209 m  AV Peak Grad:      4.2 mmHg  AV Mean Grad:      2.0 mmHg  LVOT Vmax:  71.00 cm/s  LVOT Vmean:        44.000 cm/s  LVOT VTI:          0.153 m  LVOT/AV VTI ratio: 0.73     AORTA  Ao Root diam: 3.50 cm  Ao Asc diam:  3.00 cm   MITRAL VALVE  MV Area (PHT): 3.42 cm    SHUNTS  MV Decel Time: 222 msec    Systemic VTI:  0.15 m  MV E velocity: 63.60 cm/s  Systemic Diam: 2.00 cm  MV A velocity: 62.20 cm/s  MV E/A ratio:  1.02   Oswaldo Milian MD  Electronically signed by Oswaldo Milian MD  Signature Date/Time: 06/07/2021/3:45:08 PM         Final     Assessment/Plan:  This 80 year old gentleman has severe three-vessel coronary disease with high-grade multisegment LAD stenoses, severe bifurcation stenosis involving the ramus branch with 90 to 95% lesions in both subbranches, and a severe proximal RCA stenosis with moderate RCA stenosis and an area of tortuosity.  He presented with a sizable NSTEMI although his left ventricular function is well-preserved.  I agree that coronary artery bypass graft surgery is the best treatment for his coronary disease.  He is  25 but fairly healthy overall and still active.  He has never smoked and has normal kidney function.  I had a long discussion with him and his son conferenced in on telephone about surgical treatment, benefits and risks.  They are going to think about it.  I told him that Dr. Roxan Hockey could potentially do his surgery on Monday but I would not be able to do his surgery next week.  Gaye Pollack 06/07/2021, 4:58 PM

## 2021-06-07 NOTE — Progress Notes (Signed)
ANTICOAGULATION CONSULT NOTE - Follow Up Consult  Pharmacy Consult for Heparin  Indication: chest pain/ACS  No Known Allergies  Patient Measurements: Height: 5\' 10"  (177.8 cm) Weight: 66 kg (145 lb 8 oz) IBW/kg (Calculated) : 73 Heparin Dosing Weight: 66 kg  Vital Signs: Temp: 97.7 F (36.5 C) (11/26 0535) Temp Source: Oral (11/26 0535) BP: 146/82 (11/26 0535) Pulse Rate: 77 (11/26 0535)  Labs: Recent Labs    06/04/21 1812 06/04/21 2001 06/05/21 0100 06/05/21 0254 06/05/21 0254 06/05/21 0505 06/05/21 1215 06/06/21 0226 06/07/21 0629  HGB 12.9*  --   --  11.4*  --  11.4*  --  10.9* 12.2*  HCT 39.9  --   --  33.8*  --  34.4*  --  33.5* 37.4*  PLT 197  --   --  178  --  162  --  157 188  HEPARINUNFRC  --   --   --   --    < > 0.61 0.68 0.65 0.33  CREATININE 0.94  --   --  1.00  --   --   --   --   --   TROPONINIHS 2,952* 11,026* >24,000* >24,000*  --   --   --   --   --    < > = values in this interval not displayed.    Estimated Creatinine Clearance: 56.8 mL/min (by C-G formula based on SCr of 1 mg/dL).   Medications:  Scheduled:   aspirin EC  81 mg Oral Daily   atorvastatin  80 mg Oral Daily   brimonidine  1 drop Right Eye BID   carvedilol  3.125 mg Oral BID WC   latanoprost  1 drop Both Eyes QHS   sodium chloride flush  3 mL Intravenous Q12H   timolol  1 drop Right Eye BID   Infusions:   sodium chloride     heparin 1,050 Units/hr (06/06/21 2226)   nitroGLYCERIN Stopped (06/06/21 1244)    Assessment: 80 yo M with a hx of CVA presented with new CP. Patient was not on anticoagulation PTA. Pharmacy consulted to dose heparin for NSTEMI. CT did not show dissection or PE.  Cath on 11/25 with multivessel dx > TCTS consult pending. Pharmacy was asked to resume heparin 4 hrs after TR band removal. Heparin was resumed 4 hrs post TR band removal (11/25 @ 2226).  Heparin level this morning is therapeutic at 0.33, on 1050 units/hr. Hgb 12.2, plt 188. No lines issues  or signs/symptoms of bleeding noted per RN.  Goal of Therapy:  Heparin level 0.3-0.7 units/ml Monitor platelets by anticoagulation protocol: Yes   Plan:  Increase heparin to 1100 units/hr. Check ~8 hr heparin level.  Daily CBC, heparin level. Monitor for signs/symptoms of bleeding. F/u TCTS plans.   Vance Peper, PharmD PGY1 Pharmacy Resident Phone [phone] 06/07/2021 8:22 AM   Please check AMION for all [place] phone numbers After 10:00 PM, call [place] [phone]

## 2021-06-07 NOTE — Progress Notes (Addendum)
  Echocardiogram 2D Echocardiogram with contrast has been performed.  Merrie Roof F 06/07/2021, 3:16 PM

## 2021-06-07 NOTE — Progress Notes (Signed)
ANTICOAGULATION CONSULT NOTE - Follow Up Consult  Pharmacy Consult for Heparin  Indication: chest pain/ACS  No Known Allergies  Patient Measurements: Height: 5\' 10"  (177.8 cm) Weight: 66 kg (145 lb 8 oz) IBW/kg (Calculated) : 73 Heparin Dosing Weight: 66 kg  Vital Signs: Temp: 97.7 F (36.5 C) (11/26 1620) Temp Source: Oral (11/26 1620) BP: 136/69 (11/26 1620) Pulse Rate: 63 (11/26 1620)  Labs: Recent Labs    06/04/21 1812 06/04/21 2001 06/05/21 0100 06/05/21 0254 06/05/21 0505 06/05/21 1215 06/06/21 0226 06/07/21 0629 06/07/21 1624  HGB 12.9*  --   --  11.4* 11.4*  --  10.9* 12.2*  --   HCT 39.9  --   --  33.8* 34.4*  --  33.5* 37.4*  --   PLT 197  --   --  178 162  --  157 188  --   HEPARINUNFRC  --   --   --   --  0.61   < > 0.65 0.33 0.48  CREATININE 0.94  --   --  1.00  --   --   --  0.89  --   TROPONINIHS 2,952* 11,026* >24,000* >24,000*  --   --   --   --   --    < > = values in this interval not displayed.     Estimated Creatinine Clearance: 63.9 mL/min (by C-G formula based on SCr of 0.89 mg/dL).   Medications:  Scheduled:   aspirin EC  81 mg Oral Daily   atorvastatin  80 mg Oral Daily   brimonidine  1 drop Right Eye BID   carvedilol  3.125 mg Oral BID WC   latanoprost  1 drop Both Eyes QHS   sodium chloride flush  3 mL Intravenous Q12H   timolol  1 drop Right Eye BID   Infusions:   sodium chloride     heparin 1,100 Units/hr (06/07/21 1602)    Assessment: 80 yo M with a hx of CVA presented with new CP. Patient was not on anticoagulation PTA. Pharmacy consulted to dose heparin for NSTEMI. CT did not show dissection or PE.  Cath on 11/25 with multivessel dx > TCTS consult pending. Pharmacy was asked to resume heparin 4 hrs after TR band removal. Heparin was resumed 4 hrs post TR band removal (11/25 @ 2226).  Heparin level is therapeutic at 0.48, on heparin drip rate 1100 uts/hr. Hgb 12.2, plt 188.    Goal of Therapy:  Heparin level 0.3-0.7  units/ml Monitor platelets by anticoagulation protocol: Yes   Plan:  Continue heparin 1100 units/hr. Daily CBC, heparin level. Monitor for signs/symptoms of bleeding. F/u TCTS plans.    Bonnita Nasuti Pharm.D. CPP, BCPS Clinical Pharmacist [phone] 06/07/2021 5:51 PM    Please check AMION for all [place] phone numbers After 10:00 PM, call [place] [phone]

## 2021-06-08 ENCOUNTER — Inpatient Hospital Stay (HOSPITAL_COMMUNITY): Payer: Medicare HMO

## 2021-06-08 DIAGNOSIS — Z01810 Encounter for preprocedural cardiovascular examination: Secondary | ICD-10-CM | POA: Diagnosis not present

## 2021-06-08 DIAGNOSIS — I25110 Atherosclerotic heart disease of native coronary artery with unstable angina pectoris: Secondary | ICD-10-CM | POA: Diagnosis not present

## 2021-06-08 DIAGNOSIS — I6522 Occlusion and stenosis of left carotid artery: Secondary | ICD-10-CM

## 2021-06-08 DIAGNOSIS — I214 Non-ST elevation (NSTEMI) myocardial infarction: Secondary | ICD-10-CM | POA: Diagnosis not present

## 2021-06-08 LAB — BLOOD GAS, ARTERIAL
Acid-Base Excess: 3.2 mmol/L — ABNORMAL HIGH (ref 0.0–2.0)
Bicarbonate: 27.3 mmol/L (ref 20.0–28.0)
Drawn by: 336832
FIO2: 21
O2 Saturation: 97.4 %
Patient temperature: 37
pCO2 arterial: 42.4 mmHg (ref 32.0–48.0)
pH, Arterial: 7.425 (ref 7.350–7.450)
pO2, Arterial: 93.3 mmHg (ref 83.0–108.0)

## 2021-06-08 LAB — CBC
HCT: 34.8 % — ABNORMAL LOW (ref 39.0–52.0)
Hemoglobin: 11.4 g/dL — ABNORMAL LOW (ref 13.0–17.0)
MCH: 31.3 pg (ref 26.0–34.0)
MCHC: 32.8 g/dL (ref 30.0–36.0)
MCV: 95.6 fL (ref 80.0–100.0)
Platelets: 179 10*3/uL (ref 150–400)
RBC: 3.64 MIL/uL — ABNORMAL LOW (ref 4.22–5.81)
RDW: 12.1 % (ref 11.5–15.5)
WBC: 6.7 10*3/uL (ref 4.0–10.5)
nRBC: 0 % (ref 0.0–0.2)

## 2021-06-08 LAB — URINALYSIS, ROUTINE W REFLEX MICROSCOPIC
Bacteria, UA: NONE SEEN
Bilirubin Urine: NEGATIVE
Glucose, UA: NEGATIVE mg/dL
Ketones, ur: NEGATIVE mg/dL
Nitrite: NEGATIVE
Protein, ur: NEGATIVE mg/dL
Specific Gravity, Urine: 1.009 (ref 1.005–1.030)
pH: 7 (ref 5.0–8.0)

## 2021-06-08 LAB — SURGICAL PCR SCREEN
MRSA, PCR: NEGATIVE
Staphylococcus aureus: POSITIVE — AB

## 2021-06-08 LAB — PROTIME-INR
INR: 1.1 (ref 0.8–1.2)
Prothrombin Time: 14.4 seconds (ref 11.4–15.2)

## 2021-06-08 LAB — HEPARIN LEVEL (UNFRACTIONATED): Heparin Unfractionated: 0.51 IU/mL (ref 0.30–0.70)

## 2021-06-08 LAB — TYPE AND SCREEN
ABO/RH(D): O NEG
Antibody Screen: NEGATIVE

## 2021-06-08 LAB — APTT: aPTT: 96 seconds — ABNORMAL HIGH (ref 24–36)

## 2021-06-08 MED ORDER — EPINEPHRINE HCL 5 MG/250ML IV SOLN IN NS
0.0000 ug/min | INTRAVENOUS | Status: DC
  Filled 2021-06-08: qty 250

## 2021-06-08 MED ORDER — PHENYLEPHRINE HCL-NACL 20-0.9 MG/250ML-% IV SOLN
30.0000 ug/min | INTRAVENOUS | Status: AC
  Administered 2021-06-09: 08:00:00 20 ug/min via INTRAVENOUS
  Filled 2021-06-08: qty 250

## 2021-06-08 MED ORDER — HEPARIN 30,000 UNITS/1000 ML (OHS) CELLSAVER SOLUTION
Status: DC
  Filled 2021-06-08: qty 1000

## 2021-06-08 MED ORDER — NOREPINEPHRINE 4 MG/250ML-% IV SOLN
0.0000 ug/min | INTRAVENOUS | Status: DC
  Filled 2021-06-08: qty 250

## 2021-06-08 MED ORDER — CEFAZOLIN SODIUM-DEXTROSE 2-4 GM/100ML-% IV SOLN
2.0000 g | INTRAVENOUS | Status: DC
  Filled 2021-06-08: qty 100

## 2021-06-08 MED ORDER — TEMAZEPAM 15 MG PO CAPS: 15.0000 mg | ORAL_CAPSULE | Freq: Once | ORAL | Status: DC | PRN

## 2021-06-08 MED ORDER — CHLORHEXIDINE GLUCONATE 0.12 % MT SOLN
15.0000 mL | Freq: Once | OROMUCOSAL | Status: AC
  Administered 2021-06-09: 06:00:00 15 mL via OROMUCOSAL
  Filled 2021-06-08: qty 15

## 2021-06-08 MED ORDER — NITROGLYCERIN IN D5W 200-5 MCG/ML-% IV SOLN
2.0000 ug/min | INTRAVENOUS | Status: DC
Start: 2021-06-09 — End: 2021-06-09
  Filled 2021-06-08: qty 250

## 2021-06-08 MED ORDER — TRANEXAMIC ACID (OHS) BOLUS VIA INFUSION
15.0000 mg/kg | INTRAVENOUS | Status: AC
  Administered 2021-06-09: 09:00:00 990 mg via INTRAVENOUS
  Filled 2021-06-08: qty 990

## 2021-06-08 MED ORDER — TRANEXAMIC ACID (OHS) PUMP PRIME SOLUTION
2.0000 mg/kg | INTRAVENOUS | Status: DC
  Filled 2021-06-08: qty 1.32

## 2021-06-08 MED ORDER — PLASMA-LYTE A IV SOLN
INTRAVENOUS | Status: DC
  Filled 2021-06-08: qty 5

## 2021-06-08 MED ORDER — MAGNESIUM SULFATE 50 % IJ SOLN
40.0000 meq | INTRAMUSCULAR | Status: DC
  Filled 2021-06-08: qty 9.85

## 2021-06-08 MED ORDER — CHLORHEXIDINE GLUCONATE CLOTH 2 % EX PADS
6.0000 | MEDICATED_PAD | Freq: Once | CUTANEOUS | Status: AC
  Administered 2021-06-09: 06:00:00 6 via TOPICAL

## 2021-06-08 MED ORDER — POTASSIUM CHLORIDE 2 MEQ/ML IV SOLN
80.0000 meq | INTRAVENOUS | Status: DC
  Filled 2021-06-08: qty 40

## 2021-06-08 MED ORDER — MILRINONE LACTATE IN DEXTROSE 20-5 MG/100ML-% IV SOLN
0.3000 ug/kg/min | INTRAVENOUS | Status: DC
  Filled 2021-06-08: qty 100

## 2021-06-08 MED ORDER — METOPROLOL TARTRATE 12.5 MG HALF TABLET
12.5000 mg | ORAL_TABLET | Freq: Once | ORAL | Status: AC
  Administered 2021-06-09: 06:00:00 12.5 mg via ORAL
  Filled 2021-06-08: qty 1

## 2021-06-08 MED ORDER — BISACODYL 5 MG PO TBEC
5.0000 mg | DELAYED_RELEASE_TABLET | Freq: Once | ORAL | Status: AC
  Administered 2021-06-08: 17:00:00 5 mg via ORAL
  Filled 2021-06-08: qty 1

## 2021-06-08 MED ORDER — MUPIROCIN 2 % EX OINT
1.0000 "application " | TOPICAL_OINTMENT | Freq: Two times a day (BID) | CUTANEOUS | Status: DC
  Administered 2021-06-08: 1 via NASAL
  Filled 2021-06-08 (×2): qty 22

## 2021-06-08 MED ORDER — CHLORHEXIDINE GLUCONATE CLOTH 2 % EX PADS: 6.0000 | MEDICATED_PAD | Freq: Every day | CUTANEOUS | Status: DC

## 2021-06-08 MED ORDER — CEFAZOLIN SODIUM-DEXTROSE 2-4 GM/100ML-% IV SOLN
2.0000 g | INTRAVENOUS | Status: AC
  Administered 2021-06-09 (×2): 2 g via INTRAVENOUS
  Filled 2021-06-08: qty 100

## 2021-06-08 MED ORDER — VANCOMYCIN HCL 1250 MG/250ML IV SOLN
1250.0000 mg | INTRAVENOUS | Status: AC
  Administered 2021-06-09: 08:00:00 1250 mg via INTRAVENOUS
  Filled 2021-06-08: qty 250

## 2021-06-08 MED ORDER — TRANEXAMIC ACID 1000 MG/10ML IV SOLN
1.5000 mg/kg/h | INTRAVENOUS | Status: AC
  Administered 2021-06-09: 09:00:00 1.5 mg/kg/h via INTRAVENOUS
  Filled 2021-06-08: qty 25

## 2021-06-08 MED ORDER — INSULIN REGULAR(HUMAN) IN NACL 100-0.9 UT/100ML-% IV SOLN
INTRAVENOUS | Status: AC
  Administered 2021-06-09: 08:00:00 .8 [IU]/h via INTRAVENOUS
  Filled 2021-06-08: qty 100

## 2021-06-08 MED ORDER — DEXMEDETOMIDINE HCL IN NACL 400 MCG/100ML IV SOLN
0.1000 ug/kg/h | INTRAVENOUS | Status: AC
  Administered 2021-06-09: 10:00:00 .5 ug/kg/h via INTRAVENOUS
  Filled 2021-06-08: qty 100

## 2021-06-08 MED ORDER — CHLORHEXIDINE GLUCONATE CLOTH 2 % EX PADS
6.0000 | MEDICATED_PAD | Freq: Once | CUTANEOUS | Status: AC
  Administered 2021-06-08: 22:00:00 6 via TOPICAL

## 2021-06-08 NOTE — Progress Notes (Signed)
2 Days Post-Op Procedure(s) (LRB): LEFT HEART CATH AND CORONARY ANGIOGRAPHY (N/A) Subjective:  No chest pain or shortness of breath overnight. Thought about surgery and wants to proceed.  Objective: Vital signs in last 24 hours: Temp:  [97.5 F (36.4 C)-98.2 F (36.8 C)] 97.8 F (36.6 C) (11/27 0532) Pulse Rate:  [54-66] 66 (11/27 0834) Cardiac Rhythm: Normal sinus rhythm (11/27 0759) Resp:  [16-18] 18 (11/27 0532) BP: (104-136)/(61-70) 106/61 (11/27 0834) SpO2:  [98 %-99 %] 99 % (11/27 0532)  Hemodynamic parameters for last 24 hours:    Intake/Output from previous day: 11/26 0701 - 11/27 0700 In: 1152.9 [P.O.:840; I.V.:312.9] Out: -  Intake/Output this shift: Total I/O In: 360 [P.O.:360] Out: -   General appearance: alert and cooperative Neurologic: intact Heart: regular rate and rhythm, S1, S2 normal, no murmur Lungs: clear to auscultation bilaterally  Lab Results: Recent Labs    06/07/21 0629 06/08/21 0217  WBC 6.4 6.7  HGB 12.2* 11.4*  HCT 37.4* 34.8*  PLT 188 179   BMET:  Recent Labs    06/07/21 0629  NA 138  K 3.9  CL 106  CO2 24  GLUCOSE 118*  BUN 14  CREATININE 0.89  CALCIUM 9.2    PT/INR: No results for input(s): LABPROT, INR in the last 72 hours. ABG No results found for: PHART, HCO3, TCO2, ACIDBASEDEF, O2SAT CBG (last 3)  No results for input(s): GLUCAP in the last 72 hours.  Assessment/Plan: S/P Procedure(s) (LRB): LEFT HEART CATH AND CORONARY ANGIOGRAPHY (N/A)  He is stable and wants to proceed with surgery tomorrow by Dr. Roxan Hockey. Preop orders placed.   LOS: 4 days    Gaye Pollack 06/08/2021

## 2021-06-08 NOTE — Progress Notes (Signed)
ANTICOAGULATION CONSULT NOTE - Follow Up Consult  Pharmacy Consult for Heparin  Indication: chest pain/ACS  No Known Allergies  Patient Measurements: Height: 5\' 10"  (177.8 cm) Weight: 66 kg (145 lb 8 oz) IBW/kg (Calculated) : 73 Heparin Dosing Weight: 66 kg  Vital Signs: Temp: 97.8 F (36.6 C) (11/27 0532) Temp Source: Oral (11/27 0532) BP: 106/61 (11/27 0834) Pulse Rate: 66 (11/27 0834)  Labs: Recent Labs    06/06/21 0226 06/07/21 0629 06/07/21 1624 06/08/21 0217  HGB 10.9* 12.2*  --  11.4*  HCT 33.5* 37.4*  --  34.8*  PLT 157 188  --  179  HEPARINUNFRC 0.65 0.33 0.48 0.51  CREATININE  --  0.89  --   --     Estimated Creatinine Clearance: 63.9 mL/min (by C-G formula based on SCr of 0.89 mg/dL).   Medications:  Scheduled:   aspirin EC  81 mg Oral Daily   atorvastatin  80 mg Oral Daily   brimonidine  1 drop Right Eye BID   carvedilol  3.125 mg Oral BID WC   latanoprost  1 drop Both Eyes QHS   sodium chloride flush  3 mL Intravenous Q12H   timolol  1 drop Right Eye BID   Infusions:   sodium chloride     heparin 1,100 Units/hr (06/07/21 2109)    Assessment: 80 yo M with a hx of CVA presented with new CP. Patient was not on anticoagulation PTA. Pharmacy consulted to dose heparin for NSTEMI. CT did not show dissection or PE.  Cath on 11/25 with multivessel dx > TCTS consult pending. Pharmacy was asked to resume heparin 4 hrs after TR band removal. Heparin was resumed 4 hrs post TR band removal (11/25 @ 2226).  Heparin level today is therapeutic at 0.51, on 1100 units/hr. Hgb 11.4, plt 179. No lines issues or signs/symptoms of bleeding noted per RN.  Plans noted for possible CABG, likely Monday.   Goal of Therapy:  Heparin level 0.3-0.7 units/ml Monitor platelets by anticoagulation protocol: Yes   Plan:  Continue heparin at 1100 units/hr. Daily CBC, heparin level. Monitor for signs/symptoms of bleeding. F/u TCTS plans.   Vance Peper, PharmD PGY1  Pharmacy Resident Phone [phone] 06/08/2021 8:59 AM   Please check AMION for all [place] phone numbers After 10:00 PM, call [place] [phone]

## 2021-06-08 NOTE — Progress Notes (Signed)
Cardiology Progress Note  Patient ID: Adam [name] MRN: [id number] DOB: [birth year] Date of Encounter: 06/08/2021  Primary Cardiologist: None  Subjective   Chief Complaint: None.  HPI: Evaluated for bypass surgery.  Appears to be a good candidate.  He is leaning toward surgery which could be done tomorrow per review of records.  Denies chest pain or trouble breathing this morning.  Resting comfortably in the room.  ROS:  All other ROS reviewed and negative. Pertinent positives noted in the HPI.     Inpatient Medications  Scheduled Meds:  aspirin EC  81 mg Oral Daily   atorvastatin  80 mg Oral Daily   brimonidine  1 drop Right Eye BID   carvedilol  3.125 mg Oral BID WC   latanoprost  1 drop Both Eyes QHS   sodium chloride flush  3 mL Intravenous Q12H   timolol  1 drop Right Eye BID   Continuous Infusions:  sodium chloride     heparin 1,100 Units/hr (06/07/21 2109)   PRN Meds: sodium chloride, acetaminophen, magnesium hydroxide, nitroGLYCERIN, ondansetron (ZOFRAN) IV, sodium chloride flush, zolpidem   Vital Signs   Vitals:   06/07/21 1620 06/07/21 2128 06/08/21 0130 06/08/21 0532  BP: 136/69 104/70 114/65 115/61  Pulse: 63 (!) 59 (!) 54 (!) 56  Resp: 18 16 16 18   Temp: 97.7 F (36.5 C) (!) 97.5 F (36.4 C) 98.2 F (36.8 C) 97.8 F (36.6 C)  TempSrc: Oral Axillary Oral Oral  SpO2: 98% 98% 98% 99%  Weight:      Height:        Intake/Output Summary (Last 24 hours) at 06/08/2021 0813 Last data filed at 06/08/2021 0535 Gross per 24 hour  Intake 1152.88 ml  Output --  Net 1152.88 ml   Last 3 Weights 06/04/2021 06/04/2021 07/20/2017  Weight (lbs) 145 lb 8 oz 160 lb 15 oz 162 lb 12.8 oz  Weight (kg) 65.998 kg 73 kg 73.846 kg      Telemetry  Overnight telemetry shows sinus rhythm in the 70s, which I personally reviewed.    Physical Exam   Vitals:   06/07/21 1620 06/07/21 2128 06/08/21 0130 06/08/21 0532  BP: 136/69 104/70 114/65 115/61  Pulse: 63 (!) 59  (!) 54 (!) 56  Resp: 18 16 16 18   Temp: 97.7 F (36.5 C) (!) 97.5 F (36.4 C) 98.2 F (36.8 C) 97.8 F (36.6 C)  TempSrc: Oral Axillary Oral Oral  SpO2: 98% 98% 98% 99%  Weight:      Height:        Intake/Output Summary (Last 24 hours) at 06/08/2021 0813 Last data filed at 06/08/2021 0535 Gross per 24 hour  Intake 1152.88 ml  Output --  Net 1152.88 ml    Last 3 Weights 06/04/2021 06/04/2021 07/20/2017  Weight (lbs) 145 lb 8 oz 160 lb 15 oz 162 lb 12.8 oz  Weight (kg) 65.998 kg 73 kg 73.846 kg    Body mass index is 20.88 kg/m.  General: Well nourished, well developed, in no acute distress Head: Atraumatic, normal size  Eyes: PEERLA, EOMI  Neck: Supple, no JVD Endocrine: No thryomegaly Cardiac: Normal S1, S2; RRR; no murmurs, rubs, or gallops Lungs: Clear to auscultation bilaterally, no wheezing, rhonchi or rales  Abd: Soft, nontender, no hepatomegaly  Ext: No edema, pulses 2+, right radial cath site clean and dry no evidence of hematoma Musculoskeletal: No deformities, BUE and BLE strength normal and equal Skin: Warm and dry, no rashes   Neuro: Alert  and oriented to person, place, time, and situation, CNII-XII grossly intact, no focal deficits  Psych: Normal mood and affect   Labs  High Sensitivity Troponin:   Recent Labs  Lab 06/04/21 1812 06/04/21 2001 06/05/21 0100 06/05/21 0254  TROPONINIHS 2,952* 11,026* >24,000* >24,000*     Cardiac EnzymesNo results for input(s): TROPONINI in the last 168 hours. No results for input(s): TROPIPOC in the last 168 hours.  Chemistry Recent Labs  Lab 06/04/21 1812 06/05/21 0254 06/07/21 0629  NA 137 134* 138  K 4.1 3.8 3.9  CL 101 100 106  CO2 28 29 24   GLUCOSE 167* 197* 118*  BUN 24* 18 14  CREATININE 0.94 1.00 0.89  CALCIUM 9.1 8.7* 9.2  GFRNONAA >60 >60 >60  ANIONGAP 8 5 8     Hematology Recent Labs  Lab 06/06/21 0226 06/07/21 0629 06/08/21 0217  WBC 7.4 6.4 6.7  RBC 3.46* 3.89* 3.64*  HGB 10.9* 12.2* 11.4*   HCT 33.5* 37.4* 34.8*  MCV 96.8 96.1 95.6  MCH 31.5 31.4 31.3  MCHC 32.5 32.6 32.8  RDW 12.2 12.1 12.1  PLT 157 188 179   BNPNo results for input(s): BNP, PROBNP in the last 168 hours.  DDimer No results for input(s): DDIMER in the last 168 hours.   Radiology  CARDIAC CATHETERIZATION  Result Date: 06/06/2021   Dist LM lesion is 30% stenosed.   Ost LAD lesion is 70% stenosed.   Prox LAD to Mid LAD lesion is 90% stenosed.   Mid LAD to Dist LAD lesion is 90% stenosed.   Prox Cx to Mid Cx lesion is 100% stenosed.   Ramus lesion is 95% stenosed.   Lat Ramus lesion is 90% stenosed.   Prox RCA lesion is 95% stenosed.   Mid RCA lesion is 60% stenosed.   There is mild left ventricular systolic dysfunction.   LV end diastolic pressure is normal.   The left ventricular ejection fraction is 50-55% by visual estimate. 1.  Mild distal left main stenosis 2.  Severe multisegment LAD stenoses with 70 to 75% ostial stenosis, 95% proximal stenosis, and 95% mid vessel stenosis 3.  Severe bifurcation stenosis involving the intermediate branch with 90 to 95% lesions in both sub-branches 4.  Total occlusion of the mid circumflex at a small first OM branch. 5.  Severe proximal stenosis of the RCA and moderate stenosis of the mid RCA and an area of tortuosity 6.  Mild segmental LV dysfunction with inferior wall hypokinesis, LVEF estimated at approximately 50 to 55%. Recommendations: T CTS consultation for consideration of multivessel CABG.  If the patient is not a candidate for CABG, could consider multivessel PCI as an alternative. Anatomy not favorable for PCI with ostial LAD stenosis, bifurcation disease of the ramus, and large extent of obstructive disease.   ECHOCARDIOGRAM COMPLETE  Result Date: 06/07/2021    ECHOCARDIOGRAM REPORT   Patient Name:   Adam [name] Date of Exam: 06/07/2021 Medical Rec #:  [id number]      Height:       70.0 in Accession #:    [id number]     Weight:       145.5 lb Date of Birth:   1940-09-19      BSA:          1.823 m Patient Age:    80 years       BP:           123/87 mmHg Patient Gender: M  HR:           55 bpm. Exam Location:  Inpatient Procedure: 2D Echo, Cardiac Doppler, Color Doppler and Intracardiac            Opacification Agent Indications:    CAD  History:        Patient has no prior history of Echocardiogram examinations.                 CAD.  Sonographer:    Merrie Roof RDCS Referring Phys: [phone] FAN YE IMPRESSIONS  1. Left ventricular ejection fraction, by estimation, is 55 to 60%. The left ventricle has normal function. The left ventricle has no regional wall motion abnormalities. Left ventricular diastolic parameters were normal.  2. Right ventricular systolic function is normal. The right ventricular size is normal.  3. The mitral valve is normal in structure. Mild mitral valve regurgitation.  4. The aortic valve is tricuspid. Aortic valve regurgitation is not visualized. No aortic stenosis is present.  5. The inferior vena cava is normal in size with greater than 50% respiratory variability, suggesting right atrial pressure of 3 mmHg. FINDINGS  Left Ventricle: Left ventricular ejection fraction, by estimation, is 55 to 60%. The left ventricle has normal function. The left ventricle has no regional wall motion abnormalities. Definity contrast agent was given IV to delineate the left ventricular  endocardial borders. The left ventricular internal cavity size was normal in size. There is no left ventricular hypertrophy. Left ventricular diastolic parameters were normal. Right Ventricle: The right ventricular size is normal. No increase in right ventricular wall thickness. Right ventricular systolic function is normal. Left Atrium: Left atrial size was normal in size. Right Atrium: Right atrial size was normal in size. Pericardium: There is no evidence of pericardial effusion. Mitral Valve: The mitral valve is normal in structure. Mild mitral valve regurgitation.  Tricuspid Valve: The tricuspid valve is normal in structure. Tricuspid valve regurgitation is trivial. Aortic Valve: The aortic valve is tricuspid. Aortic valve regurgitation is not visualized. No aortic stenosis is present. Aortic valve mean gradient measures 2.0 mmHg. Aortic valve peak gradient measures 4.2 mmHg. Aortic valve area, by VTI measures 2.30 cm. Pulmonic Valve: The pulmonic valve was not well visualized. Pulmonic valve regurgitation is trivial. Aorta: The aortic root and ascending aorta are structurally normal, with no evidence of dilitation. Venous: The inferior vena cava is normal in size with greater than 50% respiratory variability, suggesting right atrial pressure of 3 mmHg. IAS/Shunts: The interatrial septum was not well visualized.  LEFT VENTRICLE PLAX 2D LVIDd:         4.40 cm   Diastology LVIDs:         3.30 cm   LV e' medial:    6.42 cm/s LV PW:         1.00 cm   LV E/e' medial:  9.9 LV IVS:        0.80 cm   LV e' lateral:   10.70 cm/s LVOT diam:     2.00 cm   LV E/e' lateral: 5.9 LV SV:         48 LV SV Index:   26 LVOT Area:     3.14 cm  RIGHT VENTRICLE          IVC RV Basal diam:  3.80 cm  IVC diam: 1.80 cm LEFT ATRIUM             Index        RIGHT ATRIUM  Index LA diam:        3.80 cm 2.08 cm/m   RA Area:     17.80 cm LA Vol (A2C):   54.6 ml 29.95 ml/m  RA Volume:   51.10 ml  28.03 ml/m LA Vol (A4C):   41.9 ml 22.98 ml/m LA Biplane Vol: 51.7 ml 28.35 ml/m  AORTIC VALVE AV Area (Vmax):    2.17 cm AV Area (Vmean):   2.13 cm AV Area (VTI):     2.30 cm AV Vmax:           103.00 cm/s AV Vmean:          65.000 cm/s AV VTI:            0.209 m AV Peak Grad:      4.2 mmHg AV Mean Grad:      2.0 mmHg LVOT Vmax:         71.00 cm/s LVOT Vmean:        44.000 cm/s LVOT VTI:          0.153 m LVOT/AV VTI ratio: 0.73  AORTA Ao Root diam: 3.50 cm Ao Asc diam:  3.00 cm MITRAL VALVE MV Area (PHT): 3.42 cm    SHUNTS MV Decel Time: 222 msec    Systemic VTI:  0.15 m MV E velocity: 63.60  cm/s  Systemic Diam: 2.00 cm MV A velocity: 62.20 cm/s MV E/A ratio:  1.02 Oswaldo Milian MD Electronically signed by Oswaldo Milian MD Signature Date/Time: 06/07/2021/3:45:08 PM    Final     Cardiac Studies  TTE 06/07/2021  1. Left ventricular ejection fraction, by estimation, is 55 to 60%. The  left ventricle has normal function. The left ventricle has no regional  wall motion abnormalities. Left ventricular diastolic parameters were  normal.   2. Right ventricular systolic function is normal. The right ventricular  size is normal.   3. The mitral valve is normal in structure. Mild mitral valve  regurgitation.   4. The aortic valve is tricuspid. Aortic valve regurgitation is not  visualized. No aortic stenosis is present.   5. The inferior vena cava is normal in size with greater than 50%  respiratory variability, suggesting right atrial pressure of 3 mmHg.   LHC 06/06/2021 1.  Mild distal left main stenosis 2.  Severe multisegment LAD stenoses with 70 to 75% ostial stenosis, 95% proximal stenosis, and 95% mid vessel stenosis 3.  Severe bifurcation stenosis involving the intermediate branch with 90 to 95% lesions in both sub-branches 4.  Total occlusion of the mid circumflex at a small first OM branch. 5.  Severe proximal stenosis of the RCA and moderate stenosis of the mid RCA and an area of tortuosity 6.  Mild segmental LV dysfunction with inferior wall hypokinesis, LVEF estimated at approximately 50 to 55%.  Patient Profile  Adam [name] is a 80 y.o. male with hyperlipidemia, history of stroke who was admitted on 06/05/2021 with non-STEMI.  Left heart catheterization with three-vessel CAD.  Assessment & Plan   #Non-STEMI #Three-vessel CAD -Admitted with chest pain and elevated troponins.  Globally LV function is preserved based on review of echo. -Left heart catheterization with ostial LAD disease and severe bifurcation disease, total occlusion of the circumflex  and severe proximal RCA disease. -Anatomy not favorable for PCI. -Evaluated by surgery and he is a good candidate for CABG.  He is willing to proceed.  Tentatively plan for tomorrow with Dr. Roxan Hockey. -Continue aspirin, heparin drip and high intensity  statin.  He is on a beta-blocker.  Hemoglobin stable.  TSH normal, A1c normal.  FEN -no IVF -code: full -dvt ppx: heparin drip -diet: heart healthy, npo at midnight  For questions or updates, please contact [place] Please consult [email or web address] for contact info under   Time Spent with Patient: I have spent a total of 25 minutes with patient reviewing hospital notes, telemetry, EKGs, labs and examining the patient as well as establishing an assessment and plan that was discussed with the patient.  > 50% of time was spent in direct patient care.    Signed, Addison Naegeli. Audie Box, MD, [place]  06/08/2021 8:13 AM

## 2021-06-08 NOTE — Progress Notes (Signed)
Pre-CABG exams have been completed.  Results can be found under chart review under CV PROC. 06/08/2021 3:17 PM Ree Alcalde RVT, RDMS

## 2021-06-08 NOTE — Anesthesia Preprocedure Evaluation (Addendum)
Anesthesia Evaluation  Patient identified by MRN, date of birth, ID band Patient awake    Reviewed: Allergy & Precautions, NPO status , Patient's Chart, lab work & pertinent test results  History of Anesthesia Complications (+) PONV and history of anesthetic complications  Airway Mallampati: II  TM Distance: >3 FB Neck ROM: Full    Dental  (+) Dental Advisory Given   Pulmonary neg pulmonary ROS,    Pulmonary exam normal        Cardiovascular + CAD and + Past MI  Normal cardiovascular exam   '22 TTE - Normal EF. Mild MR, trivial TR and PR.    Neuro/Psych CVA, No Residual Symptoms negative psych ROS   GI/Hepatic negative GI ROS, Neg liver ROS,   Endo/Other  diabetes, Well Controlled  Renal/GU negative Renal ROS     Musculoskeletal negative musculoskeletal ROS (+)   Abdominal   Peds  Hematology  (+) anemia ,   Anesthesia Other Findings   Reproductive/Obstetrics                            Anesthesia Physical Anesthesia Plan  ASA: 4  Anesthesia Plan: General   Post-op Pain Management:    Induction: Intravenous  PONV Risk Score and Plan: 4 or greater and Treatment may vary due to age or medical condition and Ondansetron  Airway Management Planned: Oral ETT  Additional Equipment: Arterial line, CVP, PA Cath, TEE and Ultrasound Guidance Line Placement  Intra-op Plan:   Post-operative Plan: Post-operative intubation/ventilation  Informed Consent: I have reviewed the patients History and Physical, chart, labs and discussed the procedure including the risks, benefits and alternatives for the proposed anesthesia with the patient or authorized representative who has indicated his/her understanding and acceptance.     Dental advisory given  Plan Discussed with: CRNA and Anesthesiologist  Anesthesia Plan Comments:        Anesthesia Quick Evaluation

## 2021-06-08 NOTE — TOC Initial Note (Signed)
Transition of Care [facility]) - Initial/Assessment Note    Patient Details  Name: Adam [name] MRN: [id number] Date of Birth: Sep 28, 1940  Transition of Care [facility]) CM/SW Contact:    Verdell Carmine, RN Phone Number: 06/08/2021, 11:01 AM  Clinical Narrative:                  80 year old presented for chest pain increase troponins. Normally active at home Cardiac catheterization was done showing severe three vessel disease. TCTS consulted and recommended CABG. Patient is thinking it over, if decided could be going to surgery tomorrow.  Will likely need HH post surgical CM will follow for needs, recommendations, and transitions.  Expected Discharge Plan: [place] Barriers to Discharge: Continued Medical Work up   Patient Goals and CMS Choice        Expected Discharge Plan and Services Expected Discharge Plan: [place]       Living arrangements for the past 2 months: Single Family Home                                      Prior Living Arrangements/Services Living arrangements for the past 2 months: Single Family Home Lives with:: Spouse Patient language and need for interpreter reviewed:: Yes        Need for Family Participation in Patient Care: Yes (Comment) Care giver support system in place?: Yes (comment)   Criminal Activity/Legal Involvement Pertinent to Current Situation/Hospitalization: No - Comment as needed  Activities of Daily Living Home Assistive Devices/Equipment: None ADL Screening (condition at time of admission) Patient's cognitive ability adequate to safely complete daily activities?: Yes Is the patient deaf or have difficulty hearing?: No Does the patient have difficulty seeing, even when wearing glasses/contacts?: No Does the patient have difficulty concentrating, remembering, or making decisions?: No Patient able to express need for assistance with ADLs?: Yes Does the patient have difficulty dressing or  bathing?: No Independently performs ADLs?: Yes (appropriate for developmental age) Does the patient have difficulty walking or climbing stairs?: No Weakness of Legs: None Weakness of Arms/Hands: None  Permission Sought/Granted                  Emotional Assessment       Orientation: : Oriented to Self, Oriented to Place, Oriented to  Time, Oriented to Situation Alcohol / Substance Use: Not Applicable Psych Involvement: No (comment)  Admission diagnosis:  NSTEMI (non-ST elevated myocardial infarction) [facility]) [I21.4] Patient Active Problem List   Diagnosis Date Noted   NSTEMI (non-ST elevated myocardial infarction) ([place]) 06/04/2021   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 12/27/2019   Primary open angle glaucoma of right eye, mild stage 12/27/2019   Posterior vitreous detachment of right eye 12/27/2019   History of retinal detachment 12/27/2019   History of vitrectomy 12/27/2019   Primary open angle glaucoma of right eye, severe stage 12/27/2019   Occlusion and stenosis of carotid artery without mention of cerebral infarction 01/02/2014   Aftercare following surgery of the circulatory system, [place] 01/02/2014   PCP:  Celene Squibb, MD Pharmacy:   [place], [place] - [address] [place] [address] Phone: [phone] Fax: [phone]     Social Determinants of Health (SDOH) Interventions    Readmission Risk Interventions No flowsheet data found.

## 2021-06-09 ENCOUNTER — Inpatient Hospital Stay (HOSPITAL_COMMUNITY): Payer: Medicare HMO | Admitting: Anesthesiology

## 2021-06-09 ENCOUNTER — Inpatient Hospital Stay (HOSPITAL_COMMUNITY): Payer: Medicare HMO

## 2021-06-09 ENCOUNTER — Inpatient Hospital Stay (HOSPITAL_COMMUNITY)
Admission: EM | Disposition: A | Discharge status: Home / Self Care | Payer: Self-pay | Source: Home / Self Care | Attending: Thoracic Surgery (Cardiothoracic Vascular Surgery)

## 2021-06-09 DIAGNOSIS — Z951 Presence of aortocoronary bypass graft: Secondary | ICD-10-CM

## 2021-06-09 DIAGNOSIS — I2510 Atherosclerotic heart disease of native coronary artery without angina pectoris: Secondary | ICD-10-CM

## 2021-06-09 HISTORY — PX: CORONARY ARTERY BYPASS GRAFT: SHX141

## 2021-06-09 HISTORY — PX: TEE WITHOUT CARDIOVERSION: SHX5443

## 2021-06-09 LAB — POCT I-STAT 7, (LYTES, BLD GAS, ICA,H+H)
Acid-Base Excess: 4 mmol/L — ABNORMAL HIGH (ref 0.0–2.0)
Acid-Base Excess: 2 mmol/L (ref 0.0–2.0)
Acid-Base Excess: 3 mmol/L — ABNORMAL HIGH (ref 0.0–2.0)
Acid-Base Excess: 4 mmol/L — ABNORMAL HIGH (ref 0.0–2.0)
Acid-Base Excess: 3 mmol/L — ABNORMAL HIGH (ref 0.0–2.0)
Acid-base deficit: 2 mmol/L (ref 0.0–2.0)
Acid-base deficit: 3 mmol/L — ABNORMAL HIGH (ref 0.0–2.0)
Acid-base deficit: 4 mmol/L — ABNORMAL HIGH (ref 0.0–2.0)
Bicarbonate: 27.7 mmol/L (ref 20.0–28.0)
Bicarbonate: 25 mmol/L (ref 20.0–28.0)
Bicarbonate: 26.6 mmol/L (ref 20.0–28.0)
Bicarbonate: 27.2 mmol/L (ref 20.0–28.0)
Bicarbonate: 27 mmol/L (ref 20.0–28.0)
Bicarbonate: 23.4 mmol/L (ref 20.0–28.0)
Bicarbonate: 23 mmol/L (ref 20.0–28.0)
Bicarbonate: 21.5 mmol/L (ref 20.0–28.0)
Calcium, Ion: 0.96 mmol/L — ABNORMAL LOW (ref 1.15–1.40)
Calcium, Ion: 1.04 mmol/L — ABNORMAL LOW (ref 1.15–1.40)
Calcium, Ion: 1.06 mmol/L — ABNORMAL LOW (ref 1.15–1.40)
Calcium, Ion: 1.06 mmol/L — ABNORMAL LOW (ref 1.15–1.40)
Calcium, Ion: 1.11 mmol/L — ABNORMAL LOW (ref 1.15–1.40)
Calcium, Ion: 1.17 mmol/L (ref 1.15–1.40)
Calcium, Ion: 1.18 mmol/L (ref 1.15–1.40)
Calcium, Ion: 1.17 mmol/L (ref 1.15–1.40)
HCT: 24 % — ABNORMAL LOW (ref 39.0–52.0)
HCT: 22 % — ABNORMAL LOW (ref 39.0–52.0)
HCT: 23 % — ABNORMAL LOW (ref 39.0–52.0)
HCT: 24 % — ABNORMAL LOW (ref 39.0–52.0)
HCT: 25 % — ABNORMAL LOW (ref 39.0–52.0)
HCT: 27 % — ABNORMAL LOW (ref 39.0–52.0)
HCT: 28 % — ABNORMAL LOW (ref 39.0–52.0)
HCT: 31 % — ABNORMAL LOW (ref 39.0–52.0)
Hemoglobin: 8.2 g/dL — ABNORMAL LOW (ref 13.0–17.0)
Hemoglobin: 7.5 g/dL — ABNORMAL LOW (ref 13.0–17.0)
Hemoglobin: 7.8 g/dL — ABNORMAL LOW (ref 13.0–17.0)
Hemoglobin: 8.2 g/dL — ABNORMAL LOW (ref 13.0–17.0)
Hemoglobin: 8.5 g/dL — ABNORMAL LOW (ref 13.0–17.0)
Hemoglobin: 9.2 g/dL — ABNORMAL LOW (ref 13.0–17.0)
Hemoglobin: 9.5 g/dL — ABNORMAL LOW (ref 13.0–17.0)
Hemoglobin: 10.5 g/dL — ABNORMAL LOW (ref 13.0–17.0)
O2 Saturation: 100 %
O2 Saturation: 100 %
O2 Saturation: 100 %
O2 Saturation: 100 %
O2 Saturation: 100 %
O2 Saturation: 99 %
O2 Saturation: 99 %
O2 Saturation: 100 %
Patient temperature: 36
Patient temperature: 36.1
Patient temperature: 36.5
Potassium: 4.6 mmol/L (ref 3.5–5.1)
Potassium: 5.2 mmol/L — ABNORMAL HIGH (ref 3.5–5.1)
Potassium: 5.3 mmol/L — ABNORMAL HIGH (ref 3.5–5.1)
Potassium: 5.9 mmol/L — ABNORMAL HIGH (ref 3.5–5.1)
Potassium: 4.9 mmol/L (ref 3.5–5.1)
Potassium: 4.1 mmol/L (ref 3.5–5.1)
Potassium: 4.5 mmol/L (ref 3.5–5.1)
Potassium: 4.5 mmol/L (ref 3.5–5.1)
Sodium: 137 mmol/L (ref 135–145)
Sodium: 134 mmol/L — ABNORMAL LOW (ref 135–145)
Sodium: 134 mmol/L — ABNORMAL LOW (ref 135–145)
Sodium: 134 mmol/L — ABNORMAL LOW (ref 135–145)
Sodium: 137 mmol/L (ref 135–145)
Sodium: 139 mmol/L (ref 135–145)
Sodium: 138 mmol/L (ref 135–145)
Sodium: 137 mmol/L (ref 135–145)
TCO2: 29 mmol/L (ref 22–32)
TCO2: 26 mmol/L (ref 22–32)
TCO2: 28 mmol/L (ref 22–32)
TCO2: 28 mmol/L (ref 22–32)
TCO2: 28 mmol/L (ref 22–32)
TCO2: 25 mmol/L (ref 22–32)
TCO2: 24 mmol/L (ref 22–32)
TCO2: 23 mmol/L (ref 22–32)
pCO2 arterial: 36 mmHg (ref 32.0–48.0)
pCO2 arterial: 32.7 mmHg (ref 32.0–48.0)
pCO2 arterial: 32.9 mmHg (ref 32.0–48.0)
pCO2 arterial: 33.2 mmHg (ref 32.0–48.0)
pCO2 arterial: 39.4 mmHg (ref 32.0–48.0)
pCO2 arterial: 41.6 mmHg (ref 32.0–48.0)
pCO2 arterial: 43.6 mmHg (ref 32.0–48.0)
pCO2 arterial: 40.4 mmHg (ref 32.0–48.0)
pH, Arterial: 7.493 — ABNORMAL HIGH (ref 7.350–7.450)
pH, Arterial: 7.492 — ABNORMAL HIGH (ref 7.350–7.450)
pH, Arterial: 7.515 — ABNORMAL HIGH (ref 7.350–7.450)
pH, Arterial: 7.521 — ABNORMAL HIGH (ref 7.350–7.450)
pH, Arterial: 7.444 (ref 7.350–7.450)
pH, Arterial: 7.352 (ref 7.350–7.450)
pH, Arterial: 7.325 — ABNORMAL LOW (ref 7.350–7.450)
pH, Arterial: 7.332 — ABNORMAL LOW (ref 7.350–7.450)
pO2, Arterial: 443 mmHg — ABNORMAL HIGH (ref 83.0–108.0)
pO2, Arterial: 331 mmHg — ABNORMAL HIGH (ref 83.0–108.0)
pO2, Arterial: 369 mmHg — ABNORMAL HIGH (ref 83.0–108.0)
pO2, Arterial: 381 mmHg — ABNORMAL HIGH (ref 83.0–108.0)
pO2, Arterial: 550 mmHg — ABNORMAL HIGH (ref 83.0–108.0)
pO2, Arterial: 165 mmHg — ABNORMAL HIGH (ref 83.0–108.0)
pO2, Arterial: 176 mmHg — ABNORMAL HIGH (ref 83.0–108.0)
pO2, Arterial: 183 mmHg — ABNORMAL HIGH (ref 83.0–108.0)

## 2021-06-09 LAB — POCT I-STAT, CHEM 8
BUN: 16 mg/dL (ref 8–23)
BUN: 14 mg/dL (ref 8–23)
BUN: 15 mg/dL (ref 8–23)
BUN: 14 mg/dL (ref 8–23)
BUN: 14 mg/dL (ref 8–23)
Calcium, Ion: 1.24 mmol/L (ref 1.15–1.40)
Calcium, Ion: 1.07 mmol/L — ABNORMAL LOW (ref 1.15–1.40)
Calcium, Ion: 1.23 mmol/L (ref 1.15–1.40)
Calcium, Ion: 1.03 mmol/L — ABNORMAL LOW (ref 1.15–1.40)
Calcium, Ion: 1.1 mmol/L — ABNORMAL LOW (ref 1.15–1.40)
Chloride: 102 mmol/L (ref 98–111)
Chloride: 102 mmol/L (ref 98–111)
Chloride: 99 mmol/L (ref 98–111)
Chloride: 102 mmol/L (ref 98–111)
Chloride: 102 mmol/L (ref 98–111)
Creatinine, Ser: 0.7 mg/dL (ref 0.61–1.24)
Creatinine, Ser: 0.7 mg/dL (ref 0.61–1.24)
Creatinine, Ser: 0.7 mg/dL (ref 0.61–1.24)
Creatinine, Ser: 0.6 mg/dL — ABNORMAL LOW (ref 0.61–1.24)
Creatinine, Ser: 0.7 mg/dL (ref 0.61–1.24)
Glucose, Bld: 113 mg/dL — ABNORMAL HIGH (ref 70–99)
Glucose, Bld: 118 mg/dL — ABNORMAL HIGH (ref 70–99)
Glucose, Bld: 127 mg/dL — ABNORMAL HIGH (ref 70–99)
Glucose, Bld: 143 mg/dL — ABNORMAL HIGH (ref 70–99)
Glucose, Bld: 131 mg/dL — ABNORMAL HIGH (ref 70–99)
HCT: 34 % — ABNORMAL LOW (ref 39.0–52.0)
HCT: 26 % — ABNORMAL LOW (ref 39.0–52.0)
HCT: 30 % — ABNORMAL LOW (ref 39.0–52.0)
HCT: 25 % — ABNORMAL LOW (ref 39.0–52.0)
HCT: 27 % — ABNORMAL LOW (ref 39.0–52.0)
Hemoglobin: 11.6 g/dL — ABNORMAL LOW (ref 13.0–17.0)
Hemoglobin: 10.2 g/dL — ABNORMAL LOW (ref 13.0–17.0)
Hemoglobin: 8.8 g/dL — ABNORMAL LOW (ref 13.0–17.0)
Hemoglobin: 8.5 g/dL — ABNORMAL LOW (ref 13.0–17.0)
Hemoglobin: 9.2 g/dL — ABNORMAL LOW (ref 13.0–17.0)
Potassium: 4.4 mmol/L (ref 3.5–5.1)
Potassium: 4.4 mmol/L (ref 3.5–5.1)
Potassium: 5.5 mmol/L — ABNORMAL HIGH (ref 3.5–5.1)
Potassium: 5.9 mmol/L — ABNORMAL HIGH (ref 3.5–5.1)
Potassium: 4.8 mmol/L (ref 3.5–5.1)
Sodium: 138 mmol/L (ref 135–145)
Sodium: 134 mmol/L — ABNORMAL LOW (ref 135–145)
Sodium: 137 mmol/L (ref 135–145)
Sodium: 133 mmol/L — ABNORMAL LOW (ref 135–145)
Sodium: 137 mmol/L (ref 135–145)
TCO2: 29 mmol/L (ref 22–32)
TCO2: 26 mmol/L (ref 22–32)
TCO2: 28 mmol/L (ref 22–32)
TCO2: 26 mmol/L (ref 22–32)
TCO2: 26 mmol/L (ref 22–32)

## 2021-06-09 LAB — GLUCOSE, CAPILLARY
Glucose-Capillary: 131 mg/dL — ABNORMAL HIGH (ref 70–99)
Glucose-Capillary: 119 mg/dL — ABNORMAL HIGH (ref 70–99)
Glucose-Capillary: 105 mg/dL — ABNORMAL HIGH (ref 70–99)
Glucose-Capillary: 118 mg/dL — ABNORMAL HIGH (ref 70–99)
Glucose-Capillary: 130 mg/dL — ABNORMAL HIGH (ref 70–99)

## 2021-06-09 LAB — BASIC METABOLIC PANEL
Anion gap: 6 (ref 5–15)
Anion gap: 7 (ref 5–15)
BUN: 17 mg/dL (ref 8–23)
BUN: 13 mg/dL (ref 8–23)
CO2: 27 mmol/L (ref 22–32)
CO2: 21 mmol/L — ABNORMAL LOW (ref 22–32)
Calcium: 9.2 mg/dL (ref 8.9–10.3)
Calcium: 8 mg/dL — ABNORMAL LOW (ref 8.9–10.3)
Chloride: 103 mmol/L (ref 98–111)
Chloride: 107 mmol/L (ref 98–111)
Creatinine, Ser: 0.98 mg/dL (ref 0.61–1.24)
Creatinine, Ser: 0.8 mg/dL (ref 0.61–1.24)
GFR, Estimated: >60 mL/min (ref >60)
GFR, Estimated: >60 mL/min (ref >60)
Glucose, Bld: 111 mg/dL — ABNORMAL HIGH (ref 70–99)
Glucose, Bld: 128 mg/dL — ABNORMAL HIGH (ref 70–99)
Potassium: 5 mmol/L (ref 3.5–5.1)
Potassium: 4.5 mmol/L (ref 3.5–5.1)
Sodium: 136 mmol/L (ref 135–145)
Sodium: 135 mmol/L (ref 135–145)

## 2021-06-09 LAB — APTT: aPTT: 31 seconds (ref 24–36)

## 2021-06-09 LAB — CBC
HCT: 35.9 % — ABNORMAL LOW (ref 39.0–52.0)
HCT: 27.7 % — ABNORMAL LOW (ref 39.0–52.0)
HCT: 30.7 % — ABNORMAL LOW (ref 39.0–52.0)
Hemoglobin: 12.2 g/dL — ABNORMAL LOW (ref 13.0–17.0)
Hemoglobin: 9 g/dL — ABNORMAL LOW (ref 13.0–17.0)
Hemoglobin: 9.9 g/dL — ABNORMAL LOW (ref 13.0–17.0)
MCH: 32.4 pg (ref 26.0–34.0)
MCH: 31.3 pg (ref 26.0–34.0)
MCH: 31.2 pg (ref 26.0–34.0)
MCHC: 34 g/dL (ref 30.0–36.0)
MCHC: 32.5 g/dL (ref 30.0–36.0)
MCHC: 32.2 g/dL (ref 30.0–36.0)
MCV: 95.2 fL (ref 80.0–100.0)
MCV: 96.2 fL (ref 80.0–100.0)
MCV: 96.8 fL (ref 80.0–100.0)
Platelets: 180 10*3/uL (ref 150–400)
Platelets: 113 10*3/uL — ABNORMAL LOW (ref 150–400)
Platelets: 120 10*3/uL — ABNORMAL LOW (ref 150–400)
RBC: 3.77 MIL/uL — ABNORMAL LOW (ref 4.22–5.81)
RBC: 2.88 MIL/uL — ABNORMAL LOW (ref 4.22–5.81)
RBC: 3.17 MIL/uL — ABNORMAL LOW (ref 4.22–5.81)
RDW: 12 % (ref 11.5–15.5)
RDW: 12 % (ref 11.5–15.5)
RDW: 12.1 % (ref 11.5–15.5)
WBC: 6.3 10*3/uL (ref 4.0–10.5)
WBC: 9.8 10*3/uL (ref 4.0–10.5)
WBC: 12.9 10*3/uL — ABNORMAL HIGH (ref 4.0–10.5)
nRBC: 0 % (ref 0.0–0.2)
nRBC: 0 % (ref 0.0–0.2)
nRBC: 0 % (ref 0.0–0.2)

## 2021-06-09 LAB — PLATELET COUNT: Platelets: 127 10*3/uL — ABNORMAL LOW (ref 150–400)

## 2021-06-09 LAB — POCT I-STAT EG7
Acid-Base Excess: 3 mmol/L — ABNORMAL HIGH (ref 0.0–2.0)
Bicarbonate: 27.3 mmol/L (ref 20.0–28.0)
Calcium, Ion: 1.06 mmol/L — ABNORMAL LOW (ref 1.15–1.40)
HCT: 25 % — ABNORMAL LOW (ref 39.0–52.0)
Hemoglobin: 8.5 g/dL — ABNORMAL LOW (ref 13.0–17.0)
O2 Saturation: 79 %
Potassium: 4.3 mmol/L (ref 3.5–5.1)
Sodium: 137 mmol/L (ref 135–145)
TCO2: 29 mmol/L (ref 22–32)
pCO2, Ven: 41.2 mmHg — ABNORMAL LOW (ref 44.0–60.0)
pH, Ven: 7.429 (ref 7.250–7.430)
pO2, Ven: 42 mmHg (ref 32.0–45.0)

## 2021-06-09 LAB — ECHO INTRAOPERATIVE TEE
Height: 70 in
Weight: 2328 oz

## 2021-06-09 LAB — HEMOGLOBIN AND HEMATOCRIT, BLOOD
HCT: 24.2 % — ABNORMAL LOW (ref 39.0–52.0)
Hemoglobin: 8.3 g/dL — ABNORMAL LOW (ref 13.0–17.0)

## 2021-06-09 LAB — PROTIME-INR
INR: 1.5 — ABNORMAL HIGH (ref 0.8–1.2)
Prothrombin Time: 18.2 seconds — ABNORMAL HIGH (ref 11.4–15.2)

## 2021-06-09 LAB — MAGNESIUM: Magnesium: 3.2 mg/dL — ABNORMAL HIGH (ref 1.7–2.4)

## 2021-06-09 LAB — HEPARIN LEVEL (UNFRACTIONATED): Heparin Unfractionated: 0.43 IU/mL (ref 0.30–0.70)

## 2021-06-09 SURGERY — CORONARY ARTERY BYPASS GRAFTING (CABG)
Anesthesia: General | Site: Chest

## 2021-06-09 MED ORDER — ACETAMINOPHEN 500 MG PO TABS
1000.0000 mg | ORAL_TABLET | Freq: Four times a day (QID) | ORAL | Status: AC
  Administered 2021-06-09 – 2021-06-14 (×12): 1000 mg via ORAL
  Filled 2021-06-09 (×12): qty 2

## 2021-06-09 MED ORDER — 0.9 % SODIUM CHLORIDE (POUR BTL) OPTIME
TOPICAL | Status: DC | PRN
  Administered 2021-06-09: 09:00:00 5000 mL

## 2021-06-09 MED ORDER — PANTOPRAZOLE SODIUM 40 MG PO TBEC
40.0000 mg | DELAYED_RELEASE_TABLET | Freq: Every day | ORAL | Status: DC
  Administered 2021-06-11 – 2021-06-15 (×5): 40 mg via ORAL
  Filled 2021-06-09 (×5): qty 1

## 2021-06-09 MED ORDER — DEXTROSE 50 % IV SOLN: 0.0000 mL | INTRAVENOUS | Status: DC | PRN

## 2021-06-09 MED ORDER — CHLORHEXIDINE GLUCONATE CLOTH 2 % EX PADS
6.0000 | MEDICATED_PAD | Freq: Every day | CUTANEOUS | Status: DC
  Administered 2021-06-09 – 2021-06-10 (×2): 6 via TOPICAL

## 2021-06-09 MED ORDER — MIDAZOLAM HCL 2 MG/2ML IJ SOLN: 2.0000 mg | INTRAMUSCULAR | Status: DC | PRN

## 2021-06-09 MED ORDER — LACTATED RINGERS IV SOLN: INTRAVENOUS | Status: DC

## 2021-06-09 MED ORDER — CHLORHEXIDINE GLUCONATE CLOTH 2 % EX PADS
6.0000 | MEDICATED_PAD | Freq: Every day | CUTANEOUS | Status: DC
  Administered 2021-06-11: 6 via TOPICAL

## 2021-06-09 MED ORDER — EPHEDRINE SULFATE 50 MG/ML IJ SOLN
INTRAMUSCULAR | Status: DC | PRN
  Administered 2021-06-09: 10 mg via INTRAVENOUS
  Administered 2021-06-09: 5 mg via INTRAVENOUS

## 2021-06-09 MED ORDER — PROPOFOL 10 MG/ML IV BOLUS
INTRAVENOUS | Status: AC
  Filled 2021-06-09: qty 20

## 2021-06-09 MED ORDER — DOCUSATE SODIUM 100 MG PO CAPS
200.0000 mg | ORAL_CAPSULE | Freq: Every day | ORAL | Status: DC
  Administered 2021-06-10 – 2021-06-14 (×5): 200 mg via ORAL
  Filled 2021-06-09 (×4): qty 2

## 2021-06-09 MED ORDER — MIDAZOLAM HCL (PF) 10 MG/2ML IJ SOLN
INTRAMUSCULAR | Status: AC
  Filled 2021-06-09: qty 2

## 2021-06-09 MED ORDER — METOPROLOL TARTRATE 25 MG/10 ML ORAL SUSPENSION: 12.5000 mg | Freq: Two times a day (BID) | ORAL | Status: DC

## 2021-06-09 MED ORDER — FAMOTIDINE IN NACL 20-0.9 MG/50ML-% IV SOLN
20.0000 mg | Freq: Two times a day (BID) | INTRAVENOUS | Status: AC
  Administered 2021-06-09 (×2): 20 mg via INTRAVENOUS
  Filled 2021-06-09 (×2): qty 50

## 2021-06-09 MED ORDER — PROPOFOL 10 MG/ML IV BOLUS
INTRAVENOUS | Status: DC | PRN
  Administered 2021-06-09: 100 mg via INTRAVENOUS
  Administered 2021-06-09: 30 mg via INTRAVENOUS

## 2021-06-09 MED ORDER — METOPROLOL TARTRATE 5 MG/5ML IV SOLN
2.5000 mg | INTRAVENOUS | Status: DC | PRN
  Administered 2021-06-12: 5 mg via INTRAVENOUS
  Filled 2021-06-09 (×2): qty 5

## 2021-06-09 MED ORDER — POTASSIUM CHLORIDE 10 MEQ/50ML IV SOLN: 10.0000 meq | INTRAVENOUS | Status: AC

## 2021-06-09 MED ORDER — PROTAMINE SULFATE 10 MG/ML IV SOLN
INTRAVENOUS | Status: DC | PRN
  Administered 2021-06-09: 240 mg via INTRAVENOUS

## 2021-06-09 MED ORDER — ROCURONIUM BROMIDE 10 MG/ML (PF) SYRINGE
PREFILLED_SYRINGE | INTRAVENOUS | Status: DC | PRN
  Administered 2021-06-09: 100 mg via INTRAVENOUS
  Administered 2021-06-09: 30 mg via INTRAVENOUS

## 2021-06-09 MED ORDER — METOPROLOL TARTRATE 12.5 MG HALF TABLET
12.5000 mg | ORAL_TABLET | Freq: Two times a day (BID) | ORAL | Status: DC
  Administered 2021-06-09 – 2021-06-14 (×8): 12.5 mg via ORAL
  Filled 2021-06-09 (×8): qty 1

## 2021-06-09 MED ORDER — BISACODYL 10 MG RE SUPP: 10.0000 mg | Freq: Every day | RECTAL | Status: DC

## 2021-06-09 MED ORDER — MAGNESIUM SULFATE 4 GM/100ML IV SOLN
4.0000 g | Freq: Once | INTRAVENOUS | Status: AC
  Administered 2021-06-09: 13:00:00 4 g via INTRAVENOUS
  Filled 2021-06-09: qty 100

## 2021-06-09 MED ORDER — PHENYLEPHRINE 40 MCG/ML (10ML) SYRINGE FOR IV PUSH (FOR BLOOD PRESSURE SUPPORT)
PREFILLED_SYRINGE | INTRAVENOUS | Status: DC | PRN
  Administered 2021-06-09 (×3): 80 ug via INTRAVENOUS
  Administered 2021-06-09: 40 ug via INTRAVENOUS
  Administered 2021-06-09: 80 ug via INTRAVENOUS

## 2021-06-09 MED ORDER — CHLORHEXIDINE GLUCONATE 0.12 % MT SOLN
15.0000 mL | OROMUCOSAL | Status: AC
  Administered 2021-06-09: 13:00:00 15 mL via OROMUCOSAL

## 2021-06-09 MED ORDER — MUPIROCIN 2 % EX OINT
1.0000 "application " | TOPICAL_OINTMENT | Freq: Two times a day (BID) | CUTANEOUS | Status: AC
  Administered 2021-06-09 – 2021-06-14 (×8): 1 via NASAL
  Filled 2021-06-09 (×4): qty 22

## 2021-06-09 MED ORDER — ASPIRIN EC 325 MG PO TBEC
325.0000 mg | DELAYED_RELEASE_TABLET | Freq: Every day | ORAL | Status: DC
  Administered 2021-06-10 – 2021-06-13 (×4): 325 mg via ORAL
  Filled 2021-06-09 (×4): qty 1

## 2021-06-09 MED ORDER — LACTATED RINGERS IV SOLN: INTRAVENOUS | Status: DC | PRN

## 2021-06-09 MED ORDER — FENTANYL CITRATE (PF) 250 MCG/5ML IJ SOLN
INTRAMUSCULAR | Status: DC | PRN
  Administered 2021-06-09 (×2): 100 ug via INTRAVENOUS
  Administered 2021-06-09: 50 ug via INTRAVENOUS
  Administered 2021-06-09: 100 ug via INTRAVENOUS
  Administered 2021-06-09: 50 ug via INTRAVENOUS
  Administered 2021-06-09 (×6): 100 ug via INTRAVENOUS
  Administered 2021-06-09: 75 ug via INTRAVENOUS
  Administered 2021-06-09: 25 ug via INTRAVENOUS
  Administered 2021-06-09: 150 ug via INTRAVENOUS

## 2021-06-09 MED ORDER — HEMOSTATIC AGENTS (NO CHARGE) OPTIME
TOPICAL | Status: DC | PRN
  Administered 2021-06-09: 1 via TOPICAL

## 2021-06-09 MED ORDER — ONDANSETRON HCL 4 MG/2ML IJ SOLN
4.0000 mg | Freq: Four times a day (QID) | INTRAMUSCULAR | Status: DC | PRN
  Administered 2021-06-09 – 2021-06-13 (×4): 4 mg via INTRAVENOUS
  Filled 2021-06-09 (×4): qty 2

## 2021-06-09 MED ORDER — ALBUMIN HUMAN 5 % IV SOLN
250.0000 mL | INTRAVENOUS | Status: AC | PRN
  Administered 2021-06-09 (×2): 12.5 g via INTRAVENOUS
  Filled 2021-06-09 (×2): qty 250

## 2021-06-09 MED ORDER — ATORVASTATIN CALCIUM 80 MG PO TABS
80.0000 mg | ORAL_TABLET | Freq: Every day | ORAL | Status: DC
  Administered 2021-06-10 – 2021-06-15 (×6): 80 mg via ORAL
  Filled 2021-06-09 (×6): qty 1

## 2021-06-09 MED ORDER — SODIUM CHLORIDE 0.9% FLUSH
10.0000 mL | Freq: Two times a day (BID) | INTRAVENOUS | Status: DC
Start: 2021-06-09 — End: 2021-06-11
  Administered 2021-06-09 – 2021-06-11 (×3): 10 mL

## 2021-06-09 MED ORDER — HEPARIN SODIUM (PORCINE) 1000 UNIT/ML IJ SOLN
INTRAMUSCULAR | Status: DC | PRN
  Administered 2021-06-09: 22000 [IU] via INTRAVENOUS
  Administered 2021-06-09: 2000 [IU] via INTRAVENOUS

## 2021-06-09 MED ORDER — SODIUM CHLORIDE 0.9% FLUSH
3.0000 mL | Freq: Two times a day (BID) | INTRAVENOUS | Status: DC
  Administered 2021-06-10 – 2021-06-11 (×3): 3 mL via INTRAVENOUS

## 2021-06-09 MED ORDER — BISACODYL 5 MG PO TBEC
10.0000 mg | DELAYED_RELEASE_TABLET | Freq: Every day | ORAL | Status: DC
  Administered 2021-06-10 – 2021-06-13 (×3): 10 mg via ORAL
  Filled 2021-06-09 (×3): qty 2

## 2021-06-09 MED ORDER — SODIUM CHLORIDE 0.9% FLUSH: 10.0000 mL | INTRAVENOUS | Status: DC | PRN

## 2021-06-09 MED ORDER — CHLORHEXIDINE GLUCONATE 0.12% ORAL RINSE (MEDLINE KIT): 15.0000 mL | Freq: Two times a day (BID) | OROMUCOSAL | Status: DC

## 2021-06-09 MED ORDER — VANCOMYCIN HCL IN DEXTROSE 1-5 GM/200ML-% IV SOLN
1000.0000 mg | Freq: Once | INTRAVENOUS | Status: AC
  Administered 2021-06-09: 23:00:00 1000 mg via INTRAVENOUS
  Filled 2021-06-09 (×2): qty 200

## 2021-06-09 MED ORDER — PHENYLEPHRINE HCL-NACL 20-0.9 MG/250ML-% IV SOLN
0.0000 ug/min | INTRAVENOUS | Status: DC
  Administered 2021-06-10: 50 ug/min via INTRAVENOUS
  Filled 2021-06-09 (×2): qty 250

## 2021-06-09 MED ORDER — FENTANYL CITRATE (PF) 250 MCG/5ML IJ SOLN
INTRAMUSCULAR | Status: AC
  Filled 2021-06-09: qty 25

## 2021-06-09 MED ORDER — MIDAZOLAM HCL (PF) 5 MG/ML IJ SOLN
INTRAMUSCULAR | Status: DC | PRN
  Administered 2021-06-09: 3.5 mg via INTRAVENOUS
  Administered 2021-06-09: 1 mg via INTRAVENOUS
  Administered 2021-06-09: .5 mg via INTRAVENOUS

## 2021-06-09 MED ORDER — SODIUM CHLORIDE 0.9% FLUSH: 3.0000 mL | INTRAVENOUS | Status: DC | PRN

## 2021-06-09 MED ORDER — ORAL CARE MOUTH RINSE
15.0000 mL | OROMUCOSAL | Status: DC
  Administered 2021-06-09: 16:00:00 15 mL via OROMUCOSAL

## 2021-06-09 MED ORDER — NITROGLYCERIN IN D5W 200-5 MCG/ML-% IV SOLN: 0.0000 ug/min | INTRAVENOUS | Status: DC

## 2021-06-09 MED ORDER — SODIUM CHLORIDE 0.9 % IV SOLN: INTRAVENOUS | Status: DC

## 2021-06-09 MED ORDER — SODIUM CHLORIDE 0.9 % IV SOLN: 250.0000 mL | INTRAVENOUS | Status: DC

## 2021-06-09 MED ORDER — ACETAMINOPHEN 160 MG/5ML PO SOLN: 650.0000 mg | Freq: Once | ORAL | Status: AC

## 2021-06-09 MED ORDER — OXYCODONE HCL 5 MG PO TABS
5.0000 mg | ORAL_TABLET | ORAL | Status: DC | PRN
  Administered 2021-06-09: 22:00:00 5 mg via ORAL
  Filled 2021-06-09: qty 1

## 2021-06-09 MED ORDER — MORPHINE SULFATE (PF) 2 MG/ML IV SOLN
1.0000 mg | INTRAVENOUS | Status: DC | PRN
  Administered 2021-06-09 – 2021-06-10 (×5): 2 mg via INTRAVENOUS
  Filled 2021-06-09 (×2): qty 1
  Filled 2021-06-09: qty 2
  Filled 2021-06-09 (×2): qty 1

## 2021-06-09 MED ORDER — SODIUM CHLORIDE 0.45 % IV SOLN: INTRAVENOUS | Status: DC | PRN

## 2021-06-09 MED ORDER — DEXMEDETOMIDINE HCL IN NACL 400 MCG/100ML IV SOLN
0.0000 ug/kg/h | INTRAVENOUS | Status: DC
  Filled 2021-06-09 (×2): qty 100

## 2021-06-09 MED ORDER — ACETAMINOPHEN 650 MG RE SUPP
650.0000 mg | Freq: Once | RECTAL | Status: AC
  Administered 2021-06-09: 17:00:00 650 mg via RECTAL

## 2021-06-09 MED ORDER — ASPIRIN 81 MG PO CHEW: 324.0000 mg | CHEWABLE_TABLET | Freq: Every day | ORAL | Status: DC

## 2021-06-09 MED ORDER — TRAMADOL HCL 50 MG PO TABS
50.0000 mg | ORAL_TABLET | ORAL | Status: DC | PRN
  Administered 2021-06-09 – 2021-06-10 (×2): 50 mg via ORAL
  Filled 2021-06-09 (×2): qty 1

## 2021-06-09 MED ORDER — CHLORHEXIDINE GLUCONATE CLOTH 2 % EX PADS
6.0000 | MEDICATED_PAD | Freq: Every day | CUTANEOUS | Status: DC
  Administered 2021-06-09 – 2021-06-10 (×2): 6 via TOPICAL

## 2021-06-09 MED ORDER — CEFAZOLIN SODIUM-DEXTROSE 2-4 GM/100ML-% IV SOLN
2.0000 g | Freq: Three times a day (TID) | INTRAVENOUS | Status: AC
  Administered 2021-06-09 – 2021-06-11 (×6): 2 g via INTRAVENOUS
  Filled 2021-06-09 (×5): qty 100

## 2021-06-09 MED ORDER — INSULIN REGULAR(HUMAN) IN NACL 100-0.9 UT/100ML-% IV SOLN
INTRAVENOUS | Status: DC
  Administered 2021-06-09: 13:00:00 1.1 [IU]/h via INTRAVENOUS

## 2021-06-09 MED ORDER — LACTATED RINGERS IV SOLN: 500.0000 mL | Freq: Once | INTRAVENOUS | Status: DC | PRN

## 2021-06-09 MED ORDER — PLASMA-LYTE A IV SOLN
INTRAVENOUS | Status: DC | PRN
  Administered 2021-06-09: 09:00:00 1000 mL

## 2021-06-09 MED ORDER — ORAL CARE MOUTH RINSE
15.0000 mL | Freq: Two times a day (BID) | OROMUCOSAL | Status: DC
  Administered 2021-06-09 – 2021-06-14 (×10): 15 mL via OROMUCOSAL

## 2021-06-09 MED ORDER — ACETAMINOPHEN 160 MG/5ML PO SOLN
1000.0000 mg | Freq: Four times a day (QID) | ORAL | Status: AC
  Administered 2021-06-10 – 2021-06-11 (×3): 1000 mg
  Filled 2021-06-09 (×3): qty 40.6

## 2021-06-09 MED ORDER — SODIUM CHLORIDE (PF) 0.9 % IJ SOLN
OROMUCOSAL | Status: DC | PRN
  Administered 2021-06-09: 09:00:00 12 mL via TOPICAL

## 2021-06-09 SURGICAL SUPPLY — 78 items
BAG DECANTER FOR FLEXI CONT (MISCELLANEOUS) ×4 IMPLANT
BLADE CLIPPER SURG (BLADE) ×4 IMPLANT
BLADE STERNUM SYSTEM 6 (BLADE) ×4 IMPLANT
BNDG ELASTIC 4X5.8 VLCR STR LF (GAUZE/BANDAGES/DRESSINGS) ×4 IMPLANT
BNDG ELASTIC 6X5.8 VLCR STR LF (GAUZE/BANDAGES/DRESSINGS) ×4 IMPLANT
BNDG GAUZE ELAST 4 BULKY (GAUZE/BANDAGES/DRESSINGS) ×4 IMPLANT
CANISTER SUCT 3000ML PPV (MISCELLANEOUS) ×4 IMPLANT
CANNULA EZ GLIDE AORTIC 21FR (CANNULA) ×4 IMPLANT
CATH CPB KIT HENDRICKSON (MISCELLANEOUS) ×4 IMPLANT
CATH ROBINSON RED A/P 18FR (CATHETERS) ×4 IMPLANT
CATH THORACIC 36FR (CATHETERS) ×4 IMPLANT
CATH THORACIC 36FR RT ANG (CATHETERS) ×4 IMPLANT
CLIP FOGARTY SPRING 6M (CLIP) ×2 IMPLANT
CLIP TI WIDE RED SMALL 24 (CLIP) ×4 IMPLANT
CONTAINER PROTECT SURGISLUSH (MISCELLANEOUS) ×8 IMPLANT
DERMABOND ADVANCED (GAUZE/BANDAGES/DRESSINGS) ×2
DERMABOND ADVANCED .7 DNX12 (GAUZE/BANDAGES/DRESSINGS) IMPLANT
DRAPE CARDIOVASCULAR INCISE (DRAPES) ×2
DRAPE SRG 135X102X78XABS (DRAPES) ×2 IMPLANT
DRAPE WARM FLUID 44X44 (DRAPES) ×4 IMPLANT
DRSG COVADERM 4X14 (GAUZE/BANDAGES/DRESSINGS) ×4 IMPLANT
ELECT REM PT RETURN 9FT ADLT (ELECTROSURGICAL) ×8
ELECTRODE REM PT RTRN 9FT ADLT (ELECTROSURGICAL) ×4 IMPLANT
FELT TEFLON 1X6 (MISCELLANEOUS) ×6 IMPLANT
GAUZE SPONGE 4X4 12PLY STRL (GAUZE/BANDAGES/DRESSINGS) ×8 IMPLANT
GAUZE SPONGE 4X4 12PLY STRL LF (GAUZE/BANDAGES/DRESSINGS) ×4 IMPLANT
GLOVE SURG SIGNA 7.5 PF LTX (GLOVE) ×12 IMPLANT
GOWN STRL REUS W/ TWL LRG LVL3 (GOWN DISPOSABLE) ×8 IMPLANT
GOWN STRL REUS W/ TWL XL LVL3 (GOWN DISPOSABLE) ×4 IMPLANT
GOWN STRL REUS W/TWL LRG LVL3 (GOWN DISPOSABLE) ×8
GOWN STRL REUS W/TWL XL LVL3 (GOWN DISPOSABLE) ×4
HEMOSTAT POWDER SURGIFOAM 1G (HEMOSTASIS) ×12 IMPLANT
HEMOSTAT SURGICEL 2X14 (HEMOSTASIS) ×4 IMPLANT
INSERT FOGARTY XLG (MISCELLANEOUS) IMPLANT
KIT BASIN OR (CUSTOM PROCEDURE TRAY) ×4 IMPLANT
KIT SUCTION CATH 14FR (SUCTIONS) ×8 IMPLANT
KIT TURNOVER KIT B (KITS) ×4 IMPLANT
KIT VASOVIEW HEMOPRO 2 VH 4000 (KITS) ×4 IMPLANT
MARKER GRAFT CORONARY BYPASS (MISCELLANEOUS) ×12 IMPLANT
NS IRRIG 1000ML POUR BTL (IV SOLUTION) ×20 IMPLANT
PACK E OPEN HEART (SUTURE) ×4 IMPLANT
PACK OPEN HEART (CUSTOM PROCEDURE TRAY) ×4 IMPLANT
PAD ARMBOARD 7.5X6 YLW CONV (MISCELLANEOUS) ×8 IMPLANT
PAD ELECT DEFIB RADIOL ZOLL (MISCELLANEOUS) ×4 IMPLANT
PENCIL BUTTON HOLSTER BLD 10FT (ELECTRODE) ×4 IMPLANT
POSITIONER HEAD DONUT 9IN (MISCELLANEOUS) ×4 IMPLANT
PUNCH AORTIC ROTATE 4.0MM (MISCELLANEOUS) ×2 IMPLANT
PUNCH AORTIC ROTATE 4.5MM 8IN (MISCELLANEOUS) IMPLANT
PUNCH AORTIC ROTATE 5MM 8IN (MISCELLANEOUS) IMPLANT
SET MPS 3-ND DEL (MISCELLANEOUS) ×2 IMPLANT
SUPPORT HEART JANKE-BARRON (MISCELLANEOUS) ×4 IMPLANT
SUT BONE WAX W31G (SUTURE) ×4 IMPLANT
SUT MNCRL AB 4-0 PS2 18 (SUTURE) ×2 IMPLANT
SUT PROLENE 3 0 SH DA (SUTURE) ×4 IMPLANT
SUT PROLENE 4 0 RB 1 (SUTURE)
SUT PROLENE 4 0 SH DA (SUTURE) IMPLANT
SUT PROLENE 4-0 RB1 .5 CRCL 36 (SUTURE) IMPLANT
SUT PROLENE 6 0 C 1 30 (SUTURE) ×12 IMPLANT
SUT PROLENE 7 0 BV1 MDA (SUTURE) ×6 IMPLANT
SUT PROLENE 8 0 BV175 6 (SUTURE) ×2 IMPLANT
SUT STEEL 6MS V (SUTURE) ×4 IMPLANT
SUT STEEL STERNAL CCS#1 18IN (SUTURE) IMPLANT
SUT STEEL SZ 6 DBL 3X14 BALL (SUTURE) ×4 IMPLANT
SUT VIC AB 1 CTX 36 (SUTURE) ×4
SUT VIC AB 1 CTX36XBRD ANBCTR (SUTURE) ×4 IMPLANT
SUT VIC AB 2-0 CT1 27 (SUTURE) ×2
SUT VIC AB 2-0 CT1 TAPERPNT 27 (SUTURE) IMPLANT
SYSTEM SAHARA CHEST DRAIN ATS (WOUND CARE) ×4 IMPLANT
TAPE CLOTH SURG 4X10 WHT LF (GAUZE/BANDAGES/DRESSINGS) ×2 IMPLANT
TAPE PAPER 2X10 WHT MICROPORE (GAUZE/BANDAGES/DRESSINGS) ×2 IMPLANT
TOWEL GREEN STERILE (TOWEL DISPOSABLE) ×4 IMPLANT
TOWEL GREEN STERILE FF (TOWEL DISPOSABLE) ×4 IMPLANT
TRAY FOLEY SLVR 16FR TEMP STAT (SET/KITS/TRAYS/PACK) ×4 IMPLANT
TUBE CONNECTING 20'X1/4 (TUBING) ×1
TUBE CONNECTING 20X1/4 (TUBING) ×1 IMPLANT
TUBING LAP HI FLOW INSUFFLATIO (TUBING) ×4 IMPLANT
UNDERPAD 30X36 HEAVY ABSORB (UNDERPADS AND DIAPERS) ×4 IMPLANT
WATER STERILE IRR 1000ML POUR (IV SOLUTION) ×8 IMPLANT

## 2021-06-09 NOTE — Interval H&P Note (Signed)
History and Physical Interval Note:  Patient seen and examined, agree with above Severe 3 vessel CAD for CABG today   06/09/2021 7:28 AM  Adam [name]  has presented today for surgery, with the diagnosis of CAD.  The various methods of treatment have been discussed with the patient and family. After consideration of risks, benefits and other options for treatment, the patient has consented to  Procedure(s): CORONARY ARTERY BYPASS GRAFTING (CABG) (N/A) TRANSESOPHAGEAL ECHOCARDIOGRAM (TEE) (N/A) as a surgical intervention.  The patient's history has been reviewed, patient examined, no change in status, stable for surgery.  I have reviewed the patient's chart and labs.  Questions were answered to the patient's satisfaction.     [name]

## 2021-06-09 NOTE — Progress Notes (Signed)
      [address]       [place] [address]             [phone]      S/p CABG x 5 Extubated  BP 127/71   Pulse 89   Temp 98.4 F (36.9 C)   Resp (!) 26   Ht 5\' 10"  (1.778 m)   Wt 66 kg   SpO2 99%   BMI 20.88 kg/m   Intake/Output Summary (Last 24 hours) at 06/09/2021 1918 Last data filed at 06/09/2021 1900 Gross per 24 hour  Intake 2393.48 ml  Output 2610 ml  Net -216.52 ml   Minimal CT output  Hct 31, K= 4.5  Doing well early postop  Remo Lipps C. Roxan Hockey, MD [facility] [phone]) [phone]][

## 2021-06-09 NOTE — Anesthesia Procedure Notes (Signed)
Procedure Name: Intubation Date/Time: 06/09/2021 7:57 AM Performed by: Janace Litten, CRNA Pre-anesthesia Checklist: Patient identified, Emergency Drugs available, Suction available and Patient being monitored Patient Re-evaluated:Patient Re-evaluated prior to induction Oxygen Delivery Method: Circle System Utilized Preoxygenation: Pre-oxygenation with 100% oxygen Induction Type: IV induction Ventilation: Mask ventilation without difficulty and Oral airway inserted - appropriate to patient size Laryngoscope Size: Mac and 4 Grade View: Grade I Tube type: Oral Tube size: 8.0 mm Number of attempts: 1 Airway Equipment and Method: Stylet and Oral airway Placement Confirmation: ETT inserted through vocal cords under direct vision, positive ETCO2 and breath sounds checked- equal and bilateral Secured at: 24 cm Tube secured with: Tape Dental Injury: Teeth and Oropharynx as per pre-operative assessment

## 2021-06-09 NOTE — Transfer of Care (Signed)
Immediate Anesthesia Transfer of Care Note  Patient: EPHRAIM REICHEL  Procedure(s) Performed: CORONARY ARTERY BYPASS GRAFTING (CABG), ON PUMP, TIMES FIVE, USING LEFT INTERNAL MAMMARY ARTERY AND RIGHT ENDOSCOPICALLY HARVESTED GREATER SAPHENOUS VEIN (Chest) TRANSESOPHAGEAL ECHOCARDIOGRAM (TEE)  Patient Location: PACU  Anesthesia Type:General  Level of Consciousness: Patient remains intubated per anesthesia plan  Airway & Oxygen Therapy: Patient remains intubated per anesthesia plan and Patient placed on Ventilator (see vital sign flow sheet for setting)  Post-op Assessment: Report given to RN and Post -op Vital signs reviewed and stable  Post vital signs: Reviewed and stable  Last Vitals:  Vitals Value Taken Time  BP    Temp    Pulse 70 06/09/21 1302  Resp 14 06/09/21 1302  SpO2 100 % 06/09/21 1302  Vitals shown include unvalidated device data.  Last Pain:  Vitals:   06/09/21 0439  TempSrc: Oral  PainSc:       Patients Stated Pain Goal: 0 (39/03/00 9233)  Complications: No notable events documented.

## 2021-06-09 NOTE — Op Note (Signed)
NAME: Adam [name], Adam [name] MEDICAL RECORD NO: [id number] ACCOUNT NO: [id number] DATE OF BIRTH: [birth year] FACILITY: [facility] LOCATION: [facility] PHYSICIAN: Revonda Standard. Roxan Hockey, MD  Operative Report   DATE OF PROCEDURE: 06/09/2021  PREOPERATIVE DIAGNOSIS:  Three-vessel coronary artery disease.  POSTOPERATIVE DIAGNOSIS:  Three-vessel coronary artery disease.  PROCEDURES PERFORMED:   Median sternotomy, extracorporeal circulation,  Coronary artery bypass grafting x 5 Left internal mammary artery to LAD, Saphenous vein graft to posterior descending,  Sequential saphenous vein graft to ramus intermedius 1 and 2 and OM1.  SURGEON:  Modesto Charon, MD  ASSISTANT:  Enid Cutter, PA   ANESTHESIA:  General.  FINDINGS:  Transesophageal echocardiography showed preserved left ventricular function with no significant valvular pathology.  Vein fair quality.  Mammary good quality. PDA and OM1 fair quality targets. LAD and ramus branches good quality targets.  CLINICAL NOTE: Adam [name] is a 80 year old gentleman who presented with a non-ST elevation MI.  At catheterization, he was found to have severe 3-vessel coronary artery disease and he was referred for coronary artery bypass grafting.  The indications,  risks, benefits, and alternatives were discussed in detail with the patient by Dr. Cyndia [name].  He accepted those risks and agreed to proceed.  I met with the patient preoperatively and he had no further questions about the procedure.  OPERATIVE NOTE: Adam [name] was brought to the operating room on 06/09/2021.  He had a Swan-Ganz catheter and an arterial blood pressure monitoring lines placed by anesthesia in the preoperative holding area. In the operating room, he was anesthetized and  intubated.  Foley catheter was placed.  Intravenous antibiotics were administered. Dr. Fransisco Beau of Anesthesia performed a transesophageal echocardiography.  Please see his separately dictated note for full details of the  procedure.  The chest, abdomen and  legs were prepped and draped in the usual sterile fashion.  A median sternotomy was performed.  The left internal mammary artery was harvested using standard technique.  Simultaneously, and incision was made in the medial aspect of the right leg at the level of the knee.  Enid Cutter, PA harvested the greater saphenous vein endoscopically from mid calf to groin while I prepared the mammary artery.  Saphenous vein was relatively small and fair quality.  The mammary artery was good quality, and it had excellent flow when divided distally.  2000 units of heparin was administered during the vessel harvest.  The remainder  of the full heparin dose was given prior to opening the pericardium.  After harvesting the conduits, the pericardium was opened.  The ascending aorta was inspected.  There was no evidence of atherosclerotic disease.  After confirming adequate anticoagulation with ACT measurement, the aorta was cannulated via concentric 2-0 Ethibond pledgeted pursestring sutures.  A dual stage venous  cannula was placed via a pursestring suture in the right atrial appendage.  Cardiopulmonary bypass was initiated.  Flows were maintained per protocol.  The coronary arteries were inspected and anastomotic sites were chosen.  The conduits were inspected  and cut to length.  A foam pad was placed in the pericardium to insulate the heart.  A temperature probe was placed in the myocardial septum and a cardioplegia cannula was placed in the ascending aorta.  The aorta was cross clamped.  The left ventricle was emptied via the aortic root vent. Cardiac arrest then was achieved with a combination of cold antegrade blood cardioplegia and topical iced saline.  One liter of cardioplegia was administered. There  was myocardial septal cooling to 9 degrees  celsius.  A reversed saphenous vein graft was placed end-to-side to the posterior descending branch of the right coronary.   This was a 1.5 mm vessel, but did have plaque distal to the anastomosis.  A 1.5 mm probe did pass beyond that, but because of the distal  plaque, it was a fair quality vessel.  The vein was anastomosed end-to-side with a running 7-0 Prolene suture.  All anastomoses were probed proximally and distally at their completion.  Cardioplegia was administered down the vein graft and there was good  flow and good hemostasis.  Next, a reversed saphenous vein graft was placed sequentially to the ramus branches 1 and 2 and OM1. The two ramus branches were both 1.5 mm good quality target vessels and the OM1 was 1.5 mm fair quality vessel.  Side-to-side anastomoses were performed  to each of the ramus branches with running 7-0 Prolene sutures and then an end-to-side anastomosis was performed to OM1.  Again, a probe passed proximally and distally at completion of each anastomosis.  Cardioplegia was administered down the graft and  there was good flow and good hemostasis.  The left internal mammary artery was brought through a window in the pericardium.  The distal end was bevelled.  It was anastomosed end-to-side to the distal LAD.  There was significant plaque just proximal to the LAD anastomosis.  A 1 mm probe did pass beyond that, followed by 1.5 mm probe.  The LAD was a good vessel distal to the anastomosis.  The mammary to LAD anastomosis was performed end-to-side with a running 8-0 Prolene suture.  After completion of the anastomosis, the bulldog clamp was briefly  removed to inspect for hemostasis.  Septal rewarming was noted.  The bulldog clamp was replaced and the mammary pedicle was tacked to the epicardial surface of the heart with 6-0 Prolene sutures.  Additional cardioplegia was administered and systemic rewarming was initiated.  The vein grafts were cut to length.  The cardioplegia cannula was removed from the ascending aorta.  The proximal vein graft anastomoses were performed to 4.5 mm punch   aortotomies. The cardioplegia cannula was removed from the ascending aorta.  The proximal vein graft anastomoses were performed to 4.0 mm punch aortotomies with running 6-0 Prolene sutures.  At the completion of the final proximal anastomosis, the  patient was placed in Trendelenburg position.  Lidocaine was administered.  The aortic root was de-aired and the aortic crossclamp was removed.  The crossclamp time was 72 minutes.  The patient spontaneously resumed sinus rhythm and did not require  defibrillation. While rewarming was completed, all proximal and distal anastomoses were inspected for hemostasis.  Epicardial pacing wires were placed on the right ventricle and right atrium.  When the patient had rewarmed to a core temperature of 37  degrees Celsius, he weaned from cardiopulmonary bypass on the first attempt.  He did not require inotropic support.  Total bypass time was 111 minutes.  The initial cardiac index was greater than 2 liters per minute per meter squared and the patient  remained hemodynamically stable throughout the post-bypass period.  A test dose of protamine was administered and was well tolerated.  The atrial and aortic cannulae were removed.  Post-bypass transesophageal echocardiography was unchanged from the prebypass study.  Hemostasis was achieved.  The chest was copiously  irrigated with warm saline.  The pericardium was reapproximated over the ascending aorta and base of the heart with interrupted 3-0 silk sutures.  It came together easily without tension.  Left pleural and mediastinal chest tubes were placed through  separate subcostal incisions.  The sternum was closed with a combination of single and double heavy-gauge stainless steel wires.  The pectoralis fascia, subcutaneous tissue and skin were closed in standard fashion.  All sponge, needle and instrument  counts were correct at the end of the procedure. The patient was taken from the operating room to the surgical  intensive care unit, intubated in good condition.    Enid Cutter, PA provided assistance in this case with independent harvesting of the saphenous vein endoscopically and wound closure of the leg wounds as well as providing exposure and retraction for the coronary anastomoses.  An experienced assistant was necessary due to the complexity of the procedure.    PAA D: 06/09/2021 7:06:03 pm T: 06/09/2021 9:33:00 pm  JOB: [id number]/ [id number]

## 2021-06-09 NOTE — Brief Op Note (Signed)
06/04/2021 - 06/09/2021  11:28 AM  PATIENT:  Adam [name]  80 y.o. male  PRE-OPERATIVE DIAGNOSIS:  Coronary Artery Disease  POST-OPERATIVE DIAGNOSIS:  Coronary Artery Disease  PROCEDURE:   CORONARY ARTERY BYPASS GRAFTING (CABG), ON PUMP, TIMES FIVE, USING LEFT INTERNAL MAMMARY ARTERY AND RIGHT ENDOSCOPICALLY HARVESTED GREATER SAPHENOUS VEIN   LIMA->LAD SVG->RAMUS1_->RAMUS2 ->OM1 SVG->PDA  Vein harvest time:46min  Vein prep time: 23min  TRANSESOPHAGEAL ECHOCARDIOGRAM   SURGEON:  [name], MD - Primary  PHYSICIAN ASSISTANT: Lissandra Keil  ASSISTANTS: Thelma Barge, RN, RN First Assistant   ANESTHESIA:   general  EBL:  per anesthesia and perfusion records   BLOOD ADMINISTERED:none  DRAINS:  Left pleural and mediastinal tubes    LOCAL MEDICATIONS USED:  NONE  SPECIMEN:  No Specimen  DISPOSITION OF SPECIMEN:  N/A  COUNTS:  YES  DICTATION: .Dragon Dictation  PLAN OF CARE: Admit to inpatient   PATIENT DISPOSITION:  ICU - intubated and hemodynamically stable.   Delay start of Pharmacological VTE agent (>24hrs) due to surgical blood loss or risk of bleeding: yes

## 2021-06-09 NOTE — Anesthesia Postprocedure Evaluation (Signed)
Anesthesia Post Note  Patient: Adam [name]  Procedure(s) Performed: CORONARY ARTERY BYPASS GRAFTING (CABG), ON PUMP, TIMES FIVE, USING LEFT INTERNAL MAMMARY ARTERY AND RIGHT ENDOSCOPICALLY HARVESTED GREATER SAPHENOUS VEIN (Chest) TRANSESOPHAGEAL ECHOCARDIOGRAM (TEE)     Patient location during evaluation: ICU Anesthesia Type: General Level of consciousness: sedated and patient remains intubated per anesthesia plan Pain management: pain level controlled Vital Signs Assessment: post-procedure vital signs reviewed and stable Respiratory status: patient remains intubated per anesthesia plan Cardiovascular status: stable Postop Assessment: no apparent nausea or vomiting Anesthetic complications: no   No notable events documented.  Last Vitals:  Vitals:   06/09/21 1345 06/09/21 1400  BP: 113/76 109/73  Pulse: 70 69  Resp: 12 16  Temp: (!) 35.7 C (!) 35.8 C  SpO2: 100% 100%    Last Pain:  Vitals:   06/09/21 0439  TempSrc: Oral  PainSc:                  Adam [name]

## 2021-06-09 NOTE — Anesthesia Procedure Notes (Signed)
Central Venous Catheter Insertion Performed by: Audry Pili, MD, anesthesiologist Start/End11/28/2022 7:10 AM, 06/09/2021 7:12 AM Patient location: Pre-op. Preanesthetic checklist: patient identified, IV checked, risks and benefits discussed, surgical consent, monitors and equipment checked, pre-op evaluation, timeout performed and anesthesia consent Position: Trendelenburg Hand hygiene performed  and maximum sterile barriers used  Total catheter length 10. PA cath was placed.Swan type:thermodilution PA Cath depth:50 Procedure performed without using ultrasound guided technique. Attempts: 1 Patient tolerated the procedure well with no immediate complications.

## 2021-06-09 NOTE — Anesthesia Procedure Notes (Signed)
Arterial Line Insertion Start/End11/28/2022 6:45 AM, 06/09/2021 6:55 AM Performed by: Audry Pili, MD, Janace Litten, CRNA, CRNA  Patient location: Pre-op. Preanesthetic checklist: patient identified, IV checked, site marked, risks and benefits discussed, surgical consent, monitors and equipment checked, pre-op evaluation, timeout performed and anesthesia consent Lidocaine 1% used for infiltration and patient sedated radial was placed Catheter size: 20 G Hand hygiene performed  and maximum sterile barriers used  Allen's test indicative of satisfactory collateral circulation Attempts: 1 Procedure performed without using ultrasound guided technique. Following insertion, dressing applied and Biopatch. Post procedure assessment: normal  Patient tolerated the procedure well with no immediate complications.

## 2021-06-09 NOTE — Procedures (Signed)
Extubation Procedure Note  Patient Details:   Name: Adam [name] DOB: [birth year] MRN: [id number]   Airway Documentation:    Vent end date: 06/09/21 Vent end time: 1637   Evaluation  O2 sats: stable throughout Complications: No apparent complications Patient did tolerate procedure well. Bilateral Breath Sounds: Clear   Yes  Patient extubated per protocol to 4L [place] with no apparent complications. Positive cuff leak was noted prior to extubation. Patient achieved NIF of -28 and VC of 1.5L with good patient effort. Patient is alert and oriented and is able to weakly speak. Vitals are stable. RT will continue to monitor.   Ranen Doolin Clyda Greener 06/09/2021, 4:55 PM

## 2021-06-09 NOTE — Anesthesia Procedure Notes (Signed)
Central Venous Catheter Insertion Performed by: Audry Pili, MD, anesthesiologist Start/End11/28/2022 7:01 AM, 06/09/2021 7:12 AM Patient location: Pre-op. Preanesthetic checklist: patient identified, IV checked, risks and benefits discussed, surgical consent, monitors and equipment checked, pre-op evaluation, timeout performed and anesthesia consent Position: Trendelenburg Lidocaine 1% used for infiltration and patient sedated Hand hygiene performed , maximum sterile barriers used  and Seldinger technique used Catheter size: 8.5 Fr Central line was placed.Sheath introducer Procedure performed using ultrasound guided technique. Ultrasound Notes:anatomy identified, needle tip was noted to be adjacent to the nerve/plexus identified, no ultrasound evidence of intravascular and/or intraneural injection and image(s) printed for medical record Attempts: 1 Following insertion, line sutured, dressing applied and Biopatch. Post procedure assessment: blood return through all ports, free fluid flow and no air  Patient tolerated the procedure well with no immediate complications.

## 2021-06-10 ENCOUNTER — Encounter (HOSPITAL_COMMUNITY): Payer: Self-pay | Admitting: Thoracic Surgery (Cardiothoracic Vascular Surgery)

## 2021-06-10 ENCOUNTER — Inpatient Hospital Stay (HOSPITAL_COMMUNITY): Payer: Medicare HMO

## 2021-06-10 DIAGNOSIS — R9431 Abnormal electrocardiogram [ECG] [EKG]: Secondary | ICD-10-CM

## 2021-06-10 LAB — BASIC METABOLIC PANEL
Anion gap: 8 (ref 5–15)
Anion gap: 6 (ref 5–15)
BUN: 16 mg/dL (ref 8–23)
BUN: 20 mg/dL (ref 8–23)
CO2: 21 mmol/L — ABNORMAL LOW (ref 22–32)
CO2: 23 mmol/L (ref 22–32)
Calcium: 8.2 mg/dL — ABNORMAL LOW (ref 8.9–10.3)
Calcium: 8.2 mg/dL — ABNORMAL LOW (ref 8.9–10.3)
Chloride: 106 mmol/L (ref 98–111)
Chloride: 104 mmol/L (ref 98–111)
Creatinine, Ser: 0.89 mg/dL (ref 0.61–1.24)
Creatinine, Ser: 0.95 mg/dL (ref 0.61–1.24)
GFR, Estimated: >60 mL/min (ref >60)
GFR, Estimated: >60 mL/min (ref >60)
Glucose, Bld: 117 mg/dL — ABNORMAL HIGH (ref 70–99)
Glucose, Bld: 173 mg/dL — ABNORMAL HIGH (ref 70–99)
Potassium: 4.3 mmol/L (ref 3.5–5.1)
Potassium: 4.1 mmol/L (ref 3.5–5.1)
Sodium: 135 mmol/L (ref 135–145)
Sodium: 133 mmol/L — ABNORMAL LOW (ref 135–145)

## 2021-06-10 LAB — CBC
HCT: 28.1 % — ABNORMAL LOW (ref 39.0–52.0)
HCT: 27.2 % — ABNORMAL LOW (ref 39.0–52.0)
Hemoglobin: 9.4 g/dL — ABNORMAL LOW (ref 13.0–17.0)
Hemoglobin: 9 g/dL — ABNORMAL LOW (ref 13.0–17.0)
MCH: 31.9 pg (ref 26.0–34.0)
MCH: 32.1 pg (ref 26.0–34.0)
MCHC: 33.5 g/dL (ref 30.0–36.0)
MCHC: 33.1 g/dL (ref 30.0–36.0)
MCV: 95.3 fL (ref 80.0–100.0)
MCV: 97.1 fL (ref 80.0–100.0)
Platelets: 157 10*3/uL (ref 150–400)
Platelets: 136 10*3/uL — ABNORMAL LOW (ref 150–400)
RBC: 2.95 MIL/uL — ABNORMAL LOW (ref 4.22–5.81)
RBC: 2.8 MIL/uL — ABNORMAL LOW (ref 4.22–5.81)
RDW: 12.3 % (ref 11.5–15.5)
RDW: 12.5 % (ref 11.5–15.5)
WBC: 15.2 10*3/uL — ABNORMAL HIGH (ref 4.0–10.5)
WBC: 15.4 10*3/uL — ABNORMAL HIGH (ref 4.0–10.5)
nRBC: 0 % (ref 0.0–0.2)
nRBC: 0 % (ref 0.0–0.2)

## 2021-06-10 LAB — GLUCOSE, CAPILLARY
Glucose-Capillary: 156 mg/dL — ABNORMAL HIGH (ref 70–99)
Glucose-Capillary: 128 mg/dL — ABNORMAL HIGH (ref 70–99)
Glucose-Capillary: 116 mg/dL — ABNORMAL HIGH (ref 70–99)
Glucose-Capillary: 138 mg/dL — ABNORMAL HIGH (ref 70–99)
Glucose-Capillary: 146 mg/dL — ABNORMAL HIGH (ref 70–99)
Glucose-Capillary: 129 mg/dL — ABNORMAL HIGH (ref 70–99)
Glucose-Capillary: 130 mg/dL — ABNORMAL HIGH (ref 70–99)
Glucose-Capillary: 160 mg/dL — ABNORMAL HIGH (ref 70–99)
Glucose-Capillary: 185 mg/dL — ABNORMAL HIGH (ref 70–99)
Glucose-Capillary: 164 mg/dL — ABNORMAL HIGH (ref 70–99)

## 2021-06-10 LAB — ECHOCARDIOGRAM LIMITED
Height: 70 in
Weight: 2459.2 oz

## 2021-06-10 LAB — MAGNESIUM
Magnesium: 2.6 mg/dL — ABNORMAL HIGH (ref 1.7–2.4)
Magnesium: 2.3 mg/dL (ref 1.7–2.4)

## 2021-06-10 MED ORDER — ALBUMIN HUMAN 5 % IV SOLN
12.5000 g | Freq: Once | INTRAVENOUS | Status: AC
  Administered 2021-06-10: 12.5 g via INTRAVENOUS

## 2021-06-10 MED ORDER — ENOXAPARIN SODIUM 40 MG/0.4ML IJ SOSY
40.0000 mg | PREFILLED_SYRINGE | Freq: Every day | INTRAMUSCULAR | Status: DC
  Administered 2021-06-10 – 2021-06-14 (×5): 40 mg via SUBCUTANEOUS
  Filled 2021-06-10 (×5): qty 0.4

## 2021-06-10 MED ORDER — COLCHICINE 0.6 MG PO TABS
ORAL_TABLET | ORAL | Status: AC
  Administered 2021-06-10: 0.6 mg via ORAL
  Filled 2021-06-10: qty 1

## 2021-06-10 MED ORDER — PROPOFOL 1000 MG/100ML IV EMUL
INTRAVENOUS | Status: AC
  Filled 2021-06-10: qty 200

## 2021-06-10 MED ORDER — INSULIN ASPART 100 UNIT/ML IJ SOLN
0.0000 [IU] | INTRAMUSCULAR | Status: DC
Start: 2021-06-10 — End: 2021-06-11
  Administered 2021-06-10: 2 [IU] via SUBCUTANEOUS
  Administered 2021-06-10 (×2): 4 [IU] via SUBCUTANEOUS

## 2021-06-10 MED ORDER — INSULIN DETEMIR 100 UNIT/ML ~~LOC~~ SOLN
10.0000 [IU] | Freq: Two times a day (BID) | SUBCUTANEOUS | Status: DC
  Administered 2021-06-10 (×2): 10 [IU] via SUBCUTANEOUS
  Filled 2021-06-10 (×4): qty 0.1

## 2021-06-10 MED ORDER — COLCHICINE 0.6 MG PO TABS
0.6000 mg | ORAL_TABLET | Freq: Two times a day (BID) | ORAL | Status: DC
  Administered 2021-06-10 – 2021-06-15 (×10): 0.6 mg via ORAL
  Filled 2021-06-10 (×10): qty 1

## 2021-06-10 MED ORDER — PERFLUTREN LIPID MICROSPHERE
1.0000 mL | INTRAVENOUS | Status: AC | PRN
Start: 2021-06-10 — End: 2021-06-10
  Administered 2021-06-10: 2 mL via INTRAVENOUS
  Filled 2021-06-10: qty 10

## 2021-06-10 NOTE — Progress Notes (Signed)
EVENING ROUNDS NOTE :     [address]       [place] [address]             [phone]                 1 Day Post-Op Procedure(s) (LRB): CORONARY ARTERY BYPASS GRAFTING (CABG), ON PUMP, TIMES FIVE, USING LEFT INTERNAL MAMMARY ARTERY AND RIGHT ENDOSCOPICALLY HARVESTED GREATER SAPHENOUS VEIN (N/A) TRANSESOPHAGEAL ECHOCARDIOGRAM (TEE) (N/A)   Total Length of Stay:  LOS: 6 days  Events:   No events Stable hemodynics Echo clear    BP 127/70   Pulse 88   Temp 98.5 F (36.9 C) (Oral)   Resp (!) 22   Ht 5\' 10"  (1.778 m)   Wt 69.7 kg   SpO2 98%   BMI 22.05 kg/m   PAP: (9-26)/(2-14) 26/12 CO:  [4.1 L/min-5.9 L/min] 4.3 L/min CI:  [2.3 L/min/m2-3.3 L/min/m2] 2.4 L/min/m2      sodium chloride Stopped (06/10/21 0834)   sodium chloride Stopped (06/10/21 1553)   sodium chloride      ceFAZolin (ANCEF) IV Stopped (06/10/21 1525)   dexmedetomidine (PRECEDEX) IV infusion Stopped (06/10/21 0713)   insulin Stopped (06/10/21 1214)   lactated ringers     lactated ringers     lactated ringers 20 mL/hr at 06/10/21 1700   nitroGLYCERIN     phenylephrine (NEO-SYNEPHRINE) Adult infusion Stopped (06/10/21 1018)    I/O last 3 completed shifts: In: 3328.6 [P.O.:50; I.V.:2086.5; Blood:250; IV Piggyback:942.1] Out: 9030 [Urine:2625; Blood:500; Chest Tube:630]   CBC Latest Ref Rng & Units 06/10/2021 06/10/2021 06/09/2021  WBC 4.0 - 10.5 K/uL 15.4(H) 15.2(H) 12.9(H)  Hemoglobin 13.0 - 17.0 g/dL 9.0(L) 9.4(L) 9.9(L)  Hematocrit 39.0 - 52.0 % 27.2(L) 28.1(L) 30.7(L)  Platelets 150 - 400 K/uL 136(L) 157 120(L)    BMP Latest Ref Rng & Units 06/10/2021 06/10/2021 06/09/2021  Glucose 70 - 99 mg/dL 173(H) 117(H) 128(H)  BUN 8 - 23 mg/dL 20 16 13   Creatinine 0.61 - 1.24 mg/dL 0.95 0.89 0.80  Sodium 135 - 145 mmol/L 133(L) 135 135  Potassium 3.5 - 5.1 mmol/L 4.1 4.3 4.5  Chloride 98 - 111 mmol/L 104 106 107  CO2 22 - 32 mmol/L 23 21(L) 21(L)  Calcium 8.9 - 10.3 mg/dL  8.2(L) 8.2(L) 8.0(L)    ABG    Component Value Date/Time   PHART 7.332 (L) 06/09/2021 1732   PCO2ART 40.4 06/09/2021 1732   PO2ART 183 (H) 06/09/2021 1732   HCO3 21.5 06/09/2021 1732   TCO2 23 06/09/2021 1732   ACIDBASEDEF 4.0 (H) 06/09/2021 1732   O2SAT 100.0 06/09/2021 1732       Melodie Bouillon, MD 06/10/2021 6:55 PM

## 2021-06-10 NOTE — Progress Notes (Addendum)
1 Day Post-Op Procedure(s) (LRB): CORONARY ARTERY BYPASS GRAFTING (CABG), ON PUMP, TIMES FIVE, USING LEFT INTERNAL MAMMARY ARTERY AND RIGHT ENDOSCOPICALLY HARVESTED GREATER SAPHENOUS VEIN (N/A) TRANSESOPHAGEAL ECHOCARDIOGRAM (TEE) (N/A) Subjective: C/o left sided back pain, incisional pain,   Objective: Vital signs in last 24 hours: Temp:  [96.3 F (35.7 C)-99.9 F (37.7 C)] 99.7 F (37.6 C) (11/29 0600) Pulse Rate:  [62-95] 72 (11/29 0600) Cardiac Rhythm: Normal sinus rhythm (11/29 0600) Resp:  [10-28] 16 (11/29 0600) BP: (82-142)/(54-100) 111/69 (11/29 0500) SpO2:  [98 %-100 %] 100 % (11/29 0600) Arterial Line BP: (84-150)/(36-74) 96/39 (11/29 0600) FiO2 (%):  [36 %-50 %] 36 % (11/28 1639) Weight:  [69.7 kg] 69.7 kg (11/29 0500)  Hemodynamic parameters for last 24 hours: PAP: (9-27)/(2-15) 17/6 CO:  [4.1 L/min-5.9 L/min] 4.3 L/min CI:  [2.3 L/min/m2-3.3 L/min/m2] 2.4 L/min/m2  Intake/Output from previous day: 11/28 0701 - 11/29 0700 In: 3063.3 [P.O.:50; I.V.:1821.3; Blood:250; IV Piggyback:942.1] Out: 3155 [Urine:2025; Blood:500; Chest Tube:630] Intake/Output this shift: No intake/output data recorded.  General appearance: alert, cooperative, and no distress Neurologic: intact Heart: regular rate and rhythm and Loud rub  Lungs: clear to auscultation bilaterally Abdomen: normal findings: soft, non-tender  Lab Results: Recent Labs    06/09/21 1842 06/10/21 0456  WBC 12.9* 15.2*  HGB 9.9* 9.4*  HCT 30.7* 28.1*  PLT 120* 157   BMET:  Recent Labs    06/09/21 1842 06/10/21 0456  NA 135 135  K 4.5 4.3  CL 107 106  CO2 21* 21*  GLUCOSE 128* 117*  BUN 13 16  CREATININE 0.80 0.89  CALCIUM 8.0* 8.2*    PT/INR:  Recent Labs    06/09/21 1305  LABPROT 18.2*  INR 1.5*   ABG    Component Value Date/Time   PHART 7.332 (L) 06/09/2021 1732   HCO3 21.5 06/09/2021 1732   TCO2 23 06/09/2021 1732   ACIDBASEDEF 4.0 (H) 06/09/2021 1732   O2SAT 100.0 06/09/2021  1732   CBG (last 3)  Recent Labs    06/10/21 0332 06/10/21 0503 06/10/21 0725  GLUCAP 128* 116* 138*    Assessment/Plan: S/P Procedure(s) (LRB): CORONARY ARTERY BYPASS GRAFTING (CABG), ON PUMP, TIMES FIVE, USING LEFT INTERNAL MAMMARY ARTERY AND RIGHT ENDOSCOPICALLY HARVESTED GREATER SAPHENOUS VEIN (N/A) TRANSESOPHAGEAL ECHOCARDIOGRAM (TEE) (N/A) POD # 1 NEURO- intact CV- in SR with index of 2.5, BP and filling pressures low  ECG shows anterior ST elevation, he has a loud rub- pericarditis vs ischemia, nothing else by numbers or clinically to suggest ischemia, will review with Cardiology  RESP- IS  RENAL- creatinine and lytes OK  ENDO- CBG mildly elevated, CBG , SSI Q4   Anemia secondary to ABL- follow  SCD + enoxaparin for DVT prophylaxis  Dc chest tubes   LOS: 6 days    [name] 06/10/2021  Reviewed ECG with Dr. Audie Box.  Likely pericarditis, will check limited echo Start colchicine  Remo Lipps C. Roxan Hockey, MD [facility] [phone]

## 2021-06-10 NOTE — Progress Notes (Signed)
1600 - Report received from [place], [place].  All questions answered.  All lines and drips verified. Hand hygiene performed before/after each pt contact. 1700 - Assessment & Rx. 1830 - Pt able to ambulate a lap around the unit without difficulty or distress.  Pt given a hot tea to help soothe his throat.  Pt endorsed that the tea helped. 1900 - Report given to PM RN.  All questions answered.

## 2021-06-10 NOTE — Progress Notes (Signed)
  Echocardiogram 2D Echocardiogram with contrast has been performed.  Merrie Roof F 06/10/2021, 1:54 PM

## 2021-06-10 NOTE — Discharge Instructions (Signed)

## 2021-06-10 NOTE — Discharge Summary (Addendum)
Physician Discharge Summary  Patient ID: Adam [name] MRN: [id number] DOB/AGE: January 13, 1941 80 y.o.  Admit date: 06/04/2021 Discharge date: 06/15/2021 Admission Diagnoses: Chest pain Multivessel coronary artery disease Acute non-ST elevation myocardial infarction   Discharge Diagnoses:   Chest pain Multivessel coronary artery disease NSTEMI (non-ST elevated myocardial infarction) ([place]) S/P CABG x 5 Postoperative pericarditis Expected acute blood loss anemia   Discharged Condition: good  History of Present Illness: The patient is a 80 year old gentleman with a history of hyperlipidemia, diet-controlled diabetes, prior stroke symptoms in the right eye with right carotid endarterectomy and resolution of his symptoms, he reports being asymptomatic until he woke up this past Wednesday with aching pain in his back that he rated a 6/10 in severity.  He said that he got up and stretched and the pain went away and he went back to bed.  The following day he was lifting logs from his front porch and carrying them inside with his arms over his head.  He developed the same back pain as well as pain in both upper extremities which improved with rest.  He also developed some mild shortness of breath and nausea and presented to [facility] for evaluation.  His initial high-sensitivity troponin was 2952 and increased to greater than 24,000.  He ruled in for a NSTEMI.  He underwent cardiac catheterization yesterday which showed severe multivessel coronary disease with left ventricular ejection fraction of 50 to 55%.  His disease not felt to be ideal for PCI with a high-grade ostial LAD stenosis, bifurcation disease of the ramus branch, and multivessel coronary obstruction.  A 2D echo today showed an ejection fraction of 55 to 60% with no regional wall motion abnormality.  There is mild mitral valve regurgitation.  There is no aortic stenosis or insufficiency.  He has had no further chest or back  discomfort since admission. Coronary bypass grafting was offered to Adam [name] and discussed with him in detail.  He decided to proceed with surgery.  Hospital Course:  Mr. Peasley remained stable following left heart catheterization.  He was taken to the operating room 06/09/2021 where CABG x5 was carried out without complication.  Following the procedure, he separated from cardiopulmonary bypass without difficulty.  He was transferred to the surgical ICU in stable condition.  He remained stable hemodynamically.  He was easily weaned from mechanical ventilation and extubated by mid afternoon on the day of surgery.  The EKG obtained on the morning after surgery showed anterior ST elevation.  He was noted to have a loud rub on physical exam at the same time.  Dr. Roxan Hockey reviewed the EKG with Dr. Davina Poke and they concurred that the EKG changes likely represented pericarditis.  Mr. Uselman was started on colchicine.  An echocardiogram was obtained that showed preserved EF and no new wall motion abnormalities.  The patient underwent removal of all chest tubes and arterial lines without difficulty.  He was felt stable for transfer to the progressive care unit on 06/11/2021.  He developed Atrial Fibrillation with RVR.  He was treated with IV Amiodarone and converted back to NSR.  He was converted to an oral regimen of Amiodarone.  He did have a recurrence but did stabilize to sinus rhythm.  Some difficulty with nausea and vomiting with this as improved with time.  His pacing wires were removed without difficulty.  The patient has remained clinically stable.  His surgical incisions are healing without evidence of infection.  At the time of discharge the  patient is felt to be quite stable.  Significant Diagnostic Studies: angiography:     Dist LM lesion is 30% stenosed.   Ost LAD lesion is 70% stenosed.   Prox LAD to Mid LAD lesion is 90% stenosed.   Mid LAD to Dist LAD lesion is 90% stenosed.   Prox Cx to Mid  Cx lesion is 100% stenosed.   Ramus lesion is 95% stenosed.   Lat Ramus lesion is 90% stenosed.   Prox RCA lesion is 95% stenosed.   Mid RCA lesion is 60% stenosed.   There is mild left ventricular systolic dysfunction.   LV end diastolic pressure is normal.   The left ventricular ejection fraction is 50-55% by visual estimate.   1.  Mild distal left main stenosis 2.  Severe multisegment LAD stenoses with 70 to 75% ostial stenosis, 95% proximal stenosis, and 95% mid vessel stenosis 3.  Severe bifurcation stenosis involving the intermediate branch with 90 to 95% lesions in both sub-branches 4.  Total occlusion of the mid circumflex at a small first OM branch. 5.  Severe proximal stenosis of the RCA and moderate stenosis of the mid RCA and an area of tortuosity 6.  Mild segmental LV dysfunction with inferior wall hypokinesis, LVEF estimated at approximately 50 to 55%.   Recommendations: T CTS consultation for consideration of multivessel CABG.  If the patient is not a candidate for CABG, could consider multivessel PCI as an alternative. Anatomy not favorable for PCI with ostial LAD stenosis, bifurcation disease of the ramus, and large extent of obstructive disease.   ECHO:  IMPRESSIONS    1. Limted TTE for wall motion. Contrast study shows normal LVEF 60-65%  with no wall motion abnormalities.   2. Left ventricular ejection fraction, by estimation, is 60 to 65%. The  left ventricle has normal function. The left ventricle has no regional  wall motion abnormalities.   3. Right ventricular systolic function was not well visualized. The right  ventricular size is not well visualized.   Treatments: Surgery Operative Report    DATE OF PROCEDURE: 06/09/2021   PREOPERATIVE DIAGNOSIS:  Three-vessel coronary artery disease.   POSTOPERATIVE DIAGNOSIS:  Three-vessel coronary artery disease.   PROCEDURES PERFORMED:  Median sternotomy, extracorporeal circulation, coronary artery bypass  grafting x5, left internal mammary artery to LAD, saphenous vein graft to posterior descending, sequential saphenous vein graft to ramus intermedius 1 and 2 and  OM1.   SURGEON:  Modesto Charon, MD   ASSISTANT:  Enid Cutter, PA    ANESTHESIA:  General.   FINDINGS:  Transesophageal echocardiography showed preserved left ventricular function with no significant valvular pathology.  Vein fair quality.  Mammary good quality. PDA and OM1 fair quality targets. LAD and ramus branches good quality targets.   CLINICAL NOTE:  The patient is a 80 year old gentleman who presented with a non-ST elevation MI.  At catheterization, he was found to have severe 3-vessel coronary artery disease and he was referred for coronary artery bypass grafting.  The indications,  risks, benefits, and alternatives were discussed in detail with the patient by Dr. Cyndia Bent.  He accepted those risks and agreed to proceed.  I met with the patient preoperatively and he had no further questions about the procedure.  Discharge Exam: By Enid Cutter PA-C Blood pressure 129/78, pulse 78, temperature 98.3 F (36.8 C), temperature source Oral, resp. rate 16, height 5' 10"  (1.778 m), weight 66.7 kg, SpO2 100 %.  General appearance: alert, cooperative, and no distress Neurologic: intact  Heart: Maintaining sinus rhythm  lungs: breath sounds are clear Abdomen: soft, NT Extremities: well perfused, the RLE EVH incision is intact and dry Wound: the sternotomy incision is also well approximated and dry  Discharge disposition: 01-Home or Self Care     Discharge Instructions     Amb Referral to Cardiac Rehabilitation   Complete by: As directed    Diagnosis:  CABG NSTEMI     CABG X ___: 5   After initial evaluation and assessments completed: Virtual Based Care may be provided alone or in conjunction with Phase 2 Cardiac Rehab based on patient barriers.: Yes   Discharge patient   Complete by: As directed     Discharge disposition: 01-Home or Self Care   Discharge patient date: 06/15/2021      Allergies as of 06/15/2021   No Known Allergies      Medication List     STOP taking these medications    HYDROcodone-acetaminophen 5-325 MG tablet Commonly known as: Norco   ibuprofen 200 MG tablet Commonly known as: ADVIL   niacin 500 MG tablet       TAKE these medications    acetaminophen 500 MG tablet Commonly known as: TYLENOL Take 2 tablets (1,000 mg total) by mouth every 6 (six) hours as needed.   amiodarone 200 MG tablet Commonly known as: PACERONE Take 2 tablets (400 mg total) by mouth 2 (two) times daily. X 7 days, then 200 mg daily   aspirin EC 81 MG tablet Take 81 mg by mouth daily as needed (blood thinner). Swallow whole. What changed: Another medication with the same name was removed. Continue taking this medication, and follow the directions you see here.   atorvastatin 80 MG tablet Commonly known as: LIPITOR Take 1 tablet (80 mg total) by mouth daily.   brimonidine 0.2 % ophthalmic solution Commonly known as: ALPHAGAN INSTILL 1 DROP INTO RIGHT EYE TWICE DAILY   Chromium 1 MG Caps Take 1 mg by mouth daily.   clopidogrel 75 MG tablet Commonly known as: PLAVIX Take 1 tablet (75 mg total) by mouth daily.   colchicine 0.6 MG tablet Take 1 tablet (0.6 mg total) by mouth 2 (two) times daily.   latanoprost 0.005 % ophthalmic solution Commonly known as: XALATAN Place 1 drop into both eyes at bedtime.   MAGNESIUM PO Take 250 mg by mouth once a week.   metoprolol tartrate 25 MG tablet Commonly known as: LOPRESSOR Take 1 tablet (25 mg total) by mouth 2 (two) times daily.   timolol 0.5 % ophthalmic solution Commonly known as: BETIMOL Place 1 drop into the right eye daily.   timolol 0.5 % ophthalmic solution Commonly known as: TIMOPTIC INSTILL 1 DROP INTO RIGHT EYE ONCE DAILY What changed: See the new instructions.   traMADol 50 MG tablet Commonly known  as: ULTRAM Take 1 tablet (50 mg total) by mouth every 4 (four) hours as needed for moderate pain.   VANADIUM PO Take 1 tablet by mouth daily.   VITAMIN D3 PO Take 1 tablet by mouth daily.        Follow-up Information     Erma Heritage, PA-C. Go on 07/11/2021.   Specialties: Physician Assistant, Cardiology Why: Your appointment is at 3:30pm Contact information: [place] [address] [phone]         [name], MD Follow up.   Specialty: Cardiothoracic Surgery Why: Your appointment is at 12:45 Please arrive 30 minutes early for chest x-ray to  be performed by [facility] located on the first floor of the same building. Contact information: [address] [address] [phone]                The patient has been discharged on:   1.Beta Blocker:  Yes [  X ]                              No   [   ]                              If No, reason:  2.Ace Inhibitor/ARB: Yes [   ]                                     No  [ X   ]                                     If No, reason: labile BP  3.Statin:   Yes [ X  ]                  No  [   ]                  If No, reason:  4.Ecasa:  Yes  [ X  ]                  No   [   ]                  If No, reason:  5. Plavix:   Yes [ X  ]                     No [   ] Signed: John Giovanni, PA-C 06/16/2021, 1:50 PM

## 2021-06-11 ENCOUNTER — Inpatient Hospital Stay (HOSPITAL_COMMUNITY): Payer: Medicare HMO

## 2021-06-11 LAB — BASIC METABOLIC PANEL
Anion gap: 6 (ref 5–15)
BUN: 19 mg/dL (ref 8–23)
CO2: 25 mmol/L (ref 22–32)
Calcium: 8 mg/dL — ABNORMAL LOW (ref 8.9–10.3)
Chloride: 102 mmol/L (ref 98–111)
Creatinine, Ser: 0.84 mg/dL (ref 0.61–1.24)
GFR, Estimated: >60 mL/min (ref >60)
Glucose, Bld: 83 mg/dL (ref 70–99)
Potassium: 3.9 mmol/L (ref 3.5–5.1)
Sodium: 133 mmol/L — ABNORMAL LOW (ref 135–145)

## 2021-06-11 LAB — CBC
HCT: 26.5 % — ABNORMAL LOW (ref 39.0–52.0)
Hemoglobin: 8.7 g/dL — ABNORMAL LOW (ref 13.0–17.0)
MCH: 32.2 pg (ref 26.0–34.0)
MCHC: 32.8 g/dL (ref 30.0–36.0)
MCV: 98.1 fL (ref 80.0–100.0)
Platelets: 118 10*3/uL — ABNORMAL LOW (ref 150–400)
RBC: 2.7 MIL/uL — ABNORMAL LOW (ref 4.22–5.81)
RDW: 12.7 % (ref 11.5–15.5)
WBC: 11.6 10*3/uL — ABNORMAL HIGH (ref 4.0–10.5)
nRBC: 0 % (ref 0.0–0.2)

## 2021-06-11 LAB — GLUCOSE, CAPILLARY
Glucose-Capillary: 70 mg/dL (ref 70–99)
Glucose-Capillary: 70 mg/dL (ref 70–99)
Glucose-Capillary: 135 mg/dL — ABNORMAL HIGH (ref 70–99)
Glucose-Capillary: 161 mg/dL — ABNORMAL HIGH (ref 70–99)

## 2021-06-11 MED ORDER — SODIUM CHLORIDE 0.9 % IV SOLN: 250.0000 mL | INTRAVENOUS | Status: DC | PRN

## 2021-06-11 MED ORDER — SODIUM CHLORIDE 0.9% FLUSH
3.0000 mL | INTRAVENOUS | Status: DC | PRN
  Administered 2021-06-11: 3 mL via INTRAVENOUS

## 2021-06-11 MED ORDER — ALUM & MAG HYDROXIDE-SIMETH 200-200-20 MG/5ML PO SUSP
15.0000 mL | Freq: Four times a day (QID) | ORAL | Status: DC | PRN
  Administered 2021-06-13: 15 mL via ORAL
  Filled 2021-06-11 (×2): qty 30

## 2021-06-11 MED ORDER — POTASSIUM CHLORIDE CRYS ER 10 MEQ PO TBCR
20.0000 meq | EXTENDED_RELEASE_TABLET | Freq: Every day | ORAL | Status: DC
  Administered 2021-06-11 – 2021-06-15 (×5): 20 meq via ORAL
  Filled 2021-06-11 (×7): qty 2

## 2021-06-11 MED ORDER — ~~LOC~~ CARDIAC SURGERY, PATIENT & FAMILY EDUCATION: Freq: Once | Status: AC

## 2021-06-11 MED ORDER — FUROSEMIDE 40 MG PO TABS
40.0000 mg | ORAL_TABLET | Freq: Every day | ORAL | Status: DC
  Administered 2021-06-11 – 2021-06-15 (×5): 40 mg via ORAL
  Filled 2021-06-11 (×5): qty 1

## 2021-06-11 MED ORDER — SODIUM CHLORIDE 0.9% FLUSH
3.0000 mL | Freq: Two times a day (BID) | INTRAVENOUS | Status: DC
  Administered 2021-06-14 (×2): 3 mL via INTRAVENOUS

## 2021-06-11 MED ORDER — MAGNESIUM HYDROXIDE 400 MG/5ML PO SUSP: 30.0000 mL | Freq: Every day | ORAL | Status: DC | PRN

## 2021-06-11 NOTE — Progress Notes (Signed)
0700 - Report received from PM RN.  All questions answered.  Safety checks performed.  All lines verified. Hand hygiene performed before/after each pt contact. 0730 -Dr. Roxan Hockey rounded on pt. 0800 - Assessment.  Pt requested to leave Foley in until after ambulation. 0900 - Erin, PA rounded on pt.  Rx given. 1000 - Pt assisted from chair to bed.  Introducer D/C'd. 1200 - Pt bathed w/CHG.  New gown and linens.  Pt ambulated 370 ft without difficulty or distress.  Pt returned to chair.  Foley D/C'd.  Lunch arrived. 1408 - Report called to Vickey Sages RN.  All questions answered.  Pt transported by SWOT to 4E.

## 2021-06-11 NOTE — Progress Notes (Signed)
Transferred from [place]  A&Ox3.  CCMD notified.  Vital signs per protocol.  Oriented to room.  CHG bath completed.

## 2021-06-11 NOTE — Progress Notes (Signed)
2 Days Post-Op Procedure(s) (LRB): CORONARY ARTERY BYPASS GRAFTING (CABG), ON PUMP, TIMES FIVE, USING LEFT INTERNAL MAMMARY ARTERY AND RIGHT ENDOSCOPICALLY HARVESTED GREATER SAPHENOUS VEIN (N/A) TRANSESOPHAGEAL ECHOCARDIOGRAM (TEE) (N/A) Subjective: Denies pain, mildly confused  Objective: Vital signs in last 24 hours: Temp:  [97.1 F (36.2 C)-98.5 F (36.9 C)] 97.1 F (36.2 C) (11/30 1100) Pulse Rate:  [68-91] 74 (11/30 1300) Cardiac Rhythm: Normal sinus rhythm (11/30 0800) Resp:  [14-28] 19 (11/30 1300) BP: (89-132)/(57-78) 109/73 (11/30 1300) SpO2:  [90 %-99 %] 97 % (11/30 1300) Weight:  [67.5 kg] 67.5 kg (11/30 0500)  Hemodynamic parameters for last 24 hours:    Intake/Output from previous day: 11/29 0701 - 11/30 0700 In: 1070.9 [P.O.:240; I.V.:631; IV Piggyback:199.9] Out: 1215 [Urine:1215] Intake/Output this shift: Total I/O In: -  Out: 180 [Urine:180]  General appearance: alert, cooperative, and no distress Neurologic: no focal deficit Heart: regular rate and rhythm Lungs: clear to auscultation bilaterally Abdomen: normal findings: soft, non-tender  Lab Results: Recent Labs    06/10/21 1701 06/11/21 0419  WBC 15.4* 11.6*  HGB 9.0* 8.7*  HCT 27.2* 26.5*  PLT 136* 118*   BMET:  Recent Labs    06/10/21 1701 06/11/21 0419  NA 133* 133*  K 4.1 3.9  CL 104 102  CO2 23 25  GLUCOSE 173* 83  BUN 20 19  CREATININE 0.95 0.84  CALCIUM 8.2* 8.0*    PT/INR:  Recent Labs    06/09/21 1305  LABPROT 18.2*  INR 1.5*   ABG    Component Value Date/Time   PHART 7.332 (L) 06/09/2021 1732   HCO3 21.5 06/09/2021 1732   TCO2 23 06/09/2021 1732   ACIDBASEDEF 4.0 (H) 06/09/2021 1732   O2SAT 100.0 06/09/2021 1732   CBG (last 3)  Recent Labs    06/10/21 2315 06/11/21 0430 06/11/21 0641  GLUCAP 164* 70 70    Assessment/Plan: S/P Procedure(s) (LRB): CORONARY ARTERY BYPASS GRAFTING (CABG), ON PUMP, TIMES FIVE, USING LEFT INTERNAL MAMMARY ARTERY AND RIGHT  ENDOSCOPICALLY HARVESTED GREATER SAPHENOUS VEIN (N/A) TRANSESOPHAGEAL ECHOCARDIOGRAM (TEE) (N/A) Plan for transfer to step-down: see transfer orders POD # 2 Overall doing well NEURO- non focal but mildly confused, cooperative CV - in SR, Echo yesterday with EF 65%, no WMA  ASA, beta blocker, statin RESP- IS for atelectasis RENAL- creatinine normal  Diuresis with PO Lasix, dc Foley ENDO- CBG well controlled, dc levemir GI- tolerating clears, advance to carb modified heart healthy Anemia secondary to ABL- mild, follow SCD + enoxaparin for DVT prophylaxis Dc central line Transfer to 4E when bed available   LOS: 7 days    [name] 06/11/2021

## 2021-06-11 NOTE — Progress Notes (Signed)
      [address]       [place] [address]             [phone]      2 Days Post-Op Procedure(s) (LRB): CORONARY ARTERY BYPASS GRAFTING (CABG), ON PUMP, TIMES FIVE, USING LEFT INTERNAL MAMMARY ARTERY AND RIGHT ENDOSCOPICALLY HARVESTED GREATER SAPHENOUS VEIN (N/A) TRANSESOPHAGEAL ECHOCARDIOGRAM (TEE) (N/A) Subjective: Late entry, Adam [name] was seen at 11:40am.  Up in the bedside chair after walking a full lap in the ICU. Feels good, no concerns. Minimal appetite.    Objective: Vital signs in last 24 hours: Temp:  [97.1 F (36.2 C)-98.5 F (36.9 C)] 97.1 F (36.2 C) (11/30 1100) Pulse Rate:  [68-91] 74 (11/30 1300) Cardiac Rhythm: Normal sinus rhythm (11/30 0800) Resp:  [14-28] 19 (11/30 1300) BP: (89-132)/(57-78) 109/73 (11/30 1300) SpO2:  [90 %-99 %] 97 % (11/30 1300) Weight:  [67.5 kg] 67.5 kg (11/30 0500)  Hemodynamic parameters for last 24 hours:    Intake/Output from previous day: 11/29 0701 - 11/30 0700 In: 1070.9 [P.O.:240; I.V.:631; IV Piggyback:199.9] Out: 1215 [Urine:1215] Intake/Output this shift: Total I/O In: -  Out: 180 [Urine:180]  General appearance: alert, cooperative, and no distress Neurologic: intact Heart: RRR, no significant arrhythmias on monitor Lungs: breath sounds are clear, chest tubes are out Abdomen: soft, NT Extremities: well perfused, the RLE EVH incision is intact and dry Wound: the sternotomy incision is covered with a dry dressing.   Lab Results: Recent Labs    06/10/21 1701 06/11/21 0419  WBC 15.4* 11.6*  HGB 9.0* 8.7*  HCT 27.2* 26.5*  PLT 136* 118*   BMET:  Recent Labs    06/10/21 1701 06/11/21 0419  NA 133* 133*  K 4.1 3.9  CL 104 102  CO2 23 25  GLUCOSE 173* 83  BUN 20 19  CREATININE 0.95 0.84  CALCIUM 8.2* 8.0*    PT/INR:  Recent Labs    06/09/21 1305  LABPROT 18.2*  INR 1.5*   ABG    Component Value Date/Time   PHART 7.332 (L) 06/09/2021 1732   HCO3 21.5 06/09/2021 1732    TCO2 23 06/09/2021 1732   ACIDBASEDEF 4.0 (H) 06/09/2021 1732   O2SAT 100.0 06/09/2021 1732   CBG (last 3)  Recent Labs    06/10/21 2315 06/11/21 0430 06/11/21 0641  GLUCAP 164* 70 70    Assessment/Plan: S/P Procedure(s) (LRB): CORONARY ARTERY BYPASS GRAFTING (CABG), ON PUMP, TIMES FIVE, USING LEFT INTERNAL MAMMARY ARTERY AND RIGHT ENDOSCOPICALLY HARVESTED GREATER SAPHENOUS VEIN (N/A) TRANSESOPHAGEAL ECHOCARDIOGRAM (TEE) (N/A)  -POD2 CABG x 5 for MVCAD  after presenting with NSTEMI, pre-op EF 50-55%.  Had some ST elevations early post-op, evaluated with echo showing EF 60-65% and no new WMA's.  Pericarditis suspected, started on colchicine yesterday. Mr Granlund is progressing well with mobility.  Continue ASA, Lipitor, metoprolol. Add Plavix after pacer wire removal for NSTEMI.   -Expected acute blood loss anemia- Hct stable, monitor.  -Endo- no h/o DM. Glucose 70-185 past 24 hours. SSI discontinued  -DVT PPX- on daily enoxaparin.  -GI- tolerating PO's but has poor appetite.  -Renal-normal function, mild volume excess, on oral Lasix.  -Disposition- Progressing well POD2 CABG. Transfer to progressive care. Anticipate he will be ready for discharge in 48 hours if cardiac rhythm remains stable.     LOS: 7 days    Antony Odea, [place] [phone] 06/11/2021

## 2021-06-11 NOTE — Plan of Care (Signed)
  Problem: Education: Goal: Understanding of CV disease, CV risk reduction, and recovery process will improve Outcome: Progressing Goal: Individualized Educational Video(s) Outcome: Progressing   Problem: Activity: Goal: Ability to return to baseline activity level will improve Outcome: Progressing   Problem: Cardiovascular: Goal: Ability to achieve and maintain adequate cardiovascular perfusion will improve Outcome: Progressing Goal: Vascular access site(s) Level 0-1 will be maintained Outcome: Progressing   Problem: Health Behavior/Discharge Planning: Goal: Ability to safely manage health-related needs after discharge will improve Outcome: Progressing   Problem: Education: Goal: Knowledge of General Education information will improve Description: Including pain rating scale, medication(s)/side effects and non-pharmacologic comfort measures Outcome: Progressing   Problem: Health Behavior/Discharge Planning: Goal: Ability to manage health-related needs will improve Outcome: Progressing   Problem: Education: Goal: Will demonstrate proper wound care and an understanding of methods to prevent future damage Outcome: Progressing Goal: Knowledge of disease or condition will improve Outcome: Progressing Goal: Knowledge of the prescribed therapeutic regimen will improve Outcome: Progressing Goal: Individualized Educational Video(s) Outcome: Progressing   Problem: Activity: Goal: Risk for activity intolerance will decrease Outcome: Progressing   Problem: Cardiac: Goal: Will achieve and/or maintain hemodynamic stability Outcome: Progressing   Problem: Clinical Measurements: Goal: Postoperative complications will be avoided or minimized Outcome: Progressing   Problem: Respiratory: Goal: Respiratory status will improve Outcome: Progressing   Problem: Skin Integrity: Goal: Wound healing without signs and symptoms of infection Outcome: Progressing Goal: Risk for impaired  skin integrity will decrease Outcome: Progressing   Problem: Urinary Elimination: Goal: Ability to achieve and maintain adequate renal perfusion and functioning will improve Outcome: Progressing

## 2021-06-12 ENCOUNTER — Inpatient Hospital Stay (HOSPITAL_COMMUNITY): Payer: Medicare HMO

## 2021-06-12 LAB — BASIC METABOLIC PANEL
Anion gap: 6 (ref 5–15)
BUN: 19 mg/dL (ref 8–23)
CO2: 27 mmol/L (ref 22–32)
Calcium: 8.6 mg/dL — ABNORMAL LOW (ref 8.9–10.3)
Chloride: 101 mmol/L (ref 98–111)
Creatinine, Ser: 1.02 mg/dL (ref 0.61–1.24)
GFR, Estimated: >60 mL/min (ref >60)
Glucose, Bld: 146 mg/dL — ABNORMAL HIGH (ref 70–99)
Potassium: 4.7 mmol/L (ref 3.5–5.1)
Sodium: 134 mmol/L — ABNORMAL LOW (ref 135–145)

## 2021-06-12 LAB — CBC
HCT: 30 % — ABNORMAL LOW (ref 39.0–52.0)
Hemoglobin: 9.9 g/dL — ABNORMAL LOW (ref 13.0–17.0)
MCH: 31.6 pg (ref 26.0–34.0)
MCHC: 33 g/dL (ref 30.0–36.0)
MCV: 95.8 fL (ref 80.0–100.0)
Platelets: 183 10*3/uL (ref 150–400)
RBC: 3.13 MIL/uL — ABNORMAL LOW (ref 4.22–5.81)
RDW: 12.4 % (ref 11.5–15.5)
WBC: 12.8 10*3/uL — ABNORMAL HIGH (ref 4.0–10.5)
nRBC: 0 % (ref 0.0–0.2)

## 2021-06-12 LAB — GLUCOSE, CAPILLARY
Glucose-Capillary: 179 mg/dL — ABNORMAL HIGH (ref 70–99)
Glucose-Capillary: 178 mg/dL — ABNORMAL HIGH (ref 70–99)
Glucose-Capillary: 148 mg/dL — ABNORMAL HIGH (ref 70–99)

## 2021-06-12 MED ORDER — AMIODARONE HCL IN DEXTROSE 360-4.14 MG/200ML-% IV SOLN
60.0000 mg/h | INTRAVENOUS | Status: AC
  Administered 2021-06-12: 60 mg/h via INTRAVENOUS
  Filled 2021-06-12 (×2): qty 200

## 2021-06-12 MED ORDER — AMIODARONE LOAD VIA INFUSION
150.0000 mg | Freq: Once | INTRAVENOUS | Status: AC
  Administered 2021-06-12: 150 mg via INTRAVENOUS
  Filled 2021-06-12: qty 83.34

## 2021-06-12 MED ORDER — AMIODARONE HCL IN DEXTROSE 360-4.14 MG/200ML-% IV SOLN
30.0000 mg/h | INTRAVENOUS | Status: AC
  Administered 2021-06-12 – 2021-06-13 (×3): 30 mg/h via INTRAVENOUS
  Filled 2021-06-12 (×2): qty 200

## 2021-06-12 MED FILL — Heparin Sodium (Porcine) Inj 1000 Unit/ML: Qty: 1000 | Status: AC

## 2021-06-12 MED FILL — Potassium Chloride Inj 2 mEq/ML: INTRAVENOUS | Qty: 40 | Status: AC

## 2021-06-12 MED FILL — Magnesium Sulfate Inj 50%: INTRAMUSCULAR | Qty: 10 | Status: AC

## 2021-06-12 NOTE — Progress Notes (Signed)
Notified by CCMD of patient heart rate sustaining in 150s, I observed on the monitor apparent SVT, patient awake and alert, states he feels poorly. Administered ordered PRN metoprolol, will continue to monitor.

## 2021-06-12 NOTE — Progress Notes (Signed)
Mobility Specialist: Progress Note   06/12/21 1103  Mobility  Activity Ambulated in hall  Level of Assistance Contact guard assist, steadying assist  Assistive Device Front wheel walker  Distance Ambulated (ft) 470 ft  Mobility Ambulated with assistance in hallway  Mobility Response Tolerated well  Mobility performed by Mobility specialist  $Mobility charge 1 Mobility   Pre-Mobility: 74 HR Post-Mobility: 85 HR, 145/78 BP  Pt sitting EOB upon entering room and agreeable to mobility. Pt had no c/o during ambulation. Pt did have tendency to sway to his R side during ambulation requiring minA to correct for balance. Pt back to bed after walk and is set-up with his breakfast. Call bell is in his lap.   [facility] Jerel Sardina Mobility Specialist Mobility Specialist Phone #1: [phone] Mobility Specialist Phone #2: [phone]

## 2021-06-12 NOTE — Care Management Important Message (Signed)
Important Message  Patient Details  Name: LUVERNE ZERKLE MRN: [id number] Date of Birth: 1940/08/30   Medicare Important Message Given:  Yes     Shelda Altes 06/12/2021, 8:43 AM

## 2021-06-12 NOTE — Progress Notes (Signed)
Patient now in afib with rate 98-120s, on-call physician called and orders given.  Will continue monitoring.

## 2021-06-12 NOTE — Progress Notes (Addendum)
[address]       ,[place] [address]             [phone]      3 Days Post-Op Procedure(s) (LRB): CORONARY ARTERY BYPASS GRAFTING (CABG), ON PUMP, TIMES FIVE, USING LEFT INTERNAL MAMMARY ARTERY AND RIGHT ENDOSCOPICALLY HARVESTED GREATER SAPHENOUS VEIN (N/A) TRANSESOPHAGEAL ECHOCARDIOGRAM (TEE) (N/A) Subjective:  Transferred to 4E yesterday.  Resting in bed, no new concerns.   He developed SVT early this morning then a-fib with RVR.  Amiodarone IV loading initiated --> NSR.    Objective: Vital signs in last 24 hours: Temp:  [97.1 F (36.2 C)-98.3 F (36.8 C)] 97.9 F (36.6 C) (12/01 0355) Pulse Rate:  [72-91] 91 (12/01 0355) Cardiac Rhythm: Atrial fibrillation;Bundle branch block (12/01 0451) Resp:  [15-28] 16 (12/01 0526) BP: (109-142)/(61-84) 117/69 (12/01 0526) SpO2:  [94 %-99 %] 96 % (12/01 0355) Weight:  [70 kg] 70 kg (12/01 0355)     Intake/Output from previous day: 11/30 0701 - 12/01 0700 In: 940 [P.O.:840; IV Piggyback:100] Out: 230 [Urine:230] Intake/Output this shift: No intake/output data recorded.  General appearance: alert, cooperative, and no distress Neurologic: intact Heart: A-fib earlier, now in SR after amiodarone loading.  Lungs: breath sounds are clear Abdomen: soft, NT Extremities: well perfused, the RLE EVH incision is intact and dry Wound: the sternotomy incision is also well approximated and dry  Lab Results: Recent Labs    06/11/21 0419 06/12/21 0307  WBC 11.6* 12.8*  HGB 8.7* 9.9*  HCT 26.5* 30.0*  PLT 118* 183    BMET:  Recent Labs    06/11/21 0419 06/12/21 0307  NA 133* 134*  K 3.9 4.7  CL 102 101  CO2 25 27  GLUCOSE 83 146*  BUN 19 19  CREATININE 0.84 1.02  CALCIUM 8.0* 8.6*     PT/INR:  Recent Labs    06/09/21 1305  LABPROT 18.2*  INR 1.5*    ABG    Component Value Date/Time   PHART 7.332 (L) 06/09/2021 1732   HCO3 21.5 06/09/2021 1732   TCO2 23 06/09/2021 1732    ACIDBASEDEF 4.0 (H) 06/09/2021 1732   O2SAT 100.0 06/09/2021 1732   CBG (last 3)  Recent Labs    06/11/21 1643 06/11/21 2057 06/12/21 0619  GLUCAP 135* 161* 179*     Assessment/Plan: S/P Procedure(s) (LRB): CORONARY ARTERY BYPASS GRAFTING (CABG), ON PUMP, TIMES FIVE, USING LEFT INTERNAL MAMMARY ARTERY AND RIGHT ENDOSCOPICALLY HARVESTED GREATER SAPHENOUS VEIN (N/A) TRANSESOPHAGEAL ECHOCARDIOGRAM (TEE) (N/A)  -POD3 CABG x 5 for MVCAD  after presenting with NSTEMI, pre-op EF 50-55%.  Had some ST elevations early post-op, evaluated with echo showing EF 60-65% and no new WMA's.  Pericarditis suspected, started on colchicine. Mr Gilmer is progressing well with mobility.  Continue ASA, Lipitor, metoprolol. Add Plavix after pacer wire removal for NSTEMI.   -Atrial fibrillation- Back in SR, continue IV amiodarone loading today and convert to PO tomorrow.  K+ 4.7.  Keep pacer wires while loading amio.   -Expected acute blood loss anemia- Hct stable, monitor.  -DVT PPX- on daily enoxaparin.  -GI- tolerating PO's but still has poor appetite.  -Renal-normal function, mild volume excess, on oral Lasix.  -Disposition- Progressing well POD3 CABG. Anticipate he will be ready for discharge in 1-2 days if cardiac rhythm remains stable.     LOS: 8 days    Antony Odea, [place] [phone] 06/12/2021  Patient seen and examined, agree with above Overall  looks good.  A fib overnight- now in SR on amiodarone  Tocara Mennen C. Roxan Hockey, MD [facility] [phone]

## 2021-06-12 NOTE — Progress Notes (Signed)
CARDIAC REHAB PHASE I   PRE:  Rate/Rhythm: 66 SR    BP: sitting 106/59    SaO2: 96 RA  MODE:  Ambulation: 470 ft   POST:  Rate/Rhythm: 82 SR    BP: sitting 113/92     SaO2: 100 RA  Reviewed sternal precautions for getting out of bed. Ambulated with RW at quick pace. No c/o. Did get outside of RW on turn. Pt will probably not need RW long. His wife has equipment he can use. To recliner. Discussed IS use and sternal precautions. Pts family will be with him at d/c. [place], ACSM 06/12/2021 2:14 PM

## 2021-06-13 ENCOUNTER — Other Ambulatory Visit (HOSPITAL_COMMUNITY): Payer: Self-pay

## 2021-06-13 DIAGNOSIS — E782 Mixed hyperlipidemia: Secondary | ICD-10-CM | POA: Diagnosis not present

## 2021-06-13 DIAGNOSIS — I480 Paroxysmal atrial fibrillation: Secondary | ICD-10-CM

## 2021-06-13 DIAGNOSIS — Z951 Presence of aortocoronary bypass graft: Secondary | ICD-10-CM

## 2021-06-13 DIAGNOSIS — I214 Non-ST elevation (NSTEMI) myocardial infarction: Secondary | ICD-10-CM | POA: Diagnosis not present

## 2021-06-13 LAB — BASIC METABOLIC PANEL
Anion gap: 5 (ref 5–15)
BUN: 22 mg/dL (ref 8–23)
CO2: 25 mmol/L (ref 22–32)
Calcium: 8.2 mg/dL — ABNORMAL LOW (ref 8.9–10.3)
Chloride: 101 mmol/L (ref 98–111)
Creatinine, Ser: 1.12 mg/dL (ref 0.61–1.24)
GFR, Estimated: >60 mL/min (ref >60)
Glucose, Bld: 148 mg/dL — ABNORMAL HIGH (ref 70–99)
Potassium: 4 mmol/L (ref 3.5–5.1)
Sodium: 131 mmol/L — ABNORMAL LOW (ref 135–145)

## 2021-06-13 MED ORDER — ASPIRIN EC 81 MG PO TBEC
81.0000 mg | DELAYED_RELEASE_TABLET | Freq: Every day | ORAL | Status: DC
  Administered 2021-06-14 – 2021-06-15 (×2): 81 mg via ORAL
  Filled 2021-06-13 (×2): qty 1

## 2021-06-13 MED ORDER — ATORVASTATIN CALCIUM 80 MG PO TABS: 80.0000 mg | ORAL_TABLET | Freq: Every day | ORAL | 3 refills | Status: AC

## 2021-06-13 MED ORDER — METOPROLOL TARTRATE 25 MG PO TABS: 12.5000 mg | ORAL_TABLET | Freq: Two times a day (BID) | ORAL | 3 refills | Status: DC

## 2021-06-13 MED ORDER — POTASSIUM CHLORIDE CRYS ER 20 MEQ PO TBCR: 20.0000 meq | EXTENDED_RELEASE_TABLET | Freq: Every day | ORAL | 0 refills | Status: DC

## 2021-06-13 MED ORDER — ACETAMINOPHEN 500 MG PO TABS: 1000.0000 mg | ORAL_TABLET | Freq: Four times a day (QID) | ORAL | 0 refills | Status: DC | PRN

## 2021-06-13 MED ORDER — AMIODARONE HCL 200 MG PO TABS
400.0000 mg | ORAL_TABLET | Freq: Two times a day (BID) | ORAL | Status: DC
  Administered 2021-06-13 – 2021-06-15 (×5): 400 mg via ORAL
  Filled 2021-06-13 (×5): qty 2

## 2021-06-13 MED ORDER — TRAMADOL HCL 50 MG PO TABS: 50.0000 mg | ORAL_TABLET | ORAL | 0 refills | Status: DC | PRN

## 2021-06-13 MED ORDER — COLCHICINE 0.6 MG PO TABS: 0.6000 mg | ORAL_TABLET | Freq: Two times a day (BID) | ORAL | 3 refills | Status: DC

## 2021-06-13 MED ORDER — CLOPIDOGREL BISULFATE 75 MG PO TABS: 75.0000 mg | ORAL_TABLET | Freq: Every day | ORAL | 3 refills | Status: DC

## 2021-06-13 MED ORDER — AMIODARONE HCL 200 MG PO TABS: 400.0000 mg | ORAL_TABLET | Freq: Two times a day (BID) | ORAL | 1 refills | Status: DC

## 2021-06-13 MED ORDER — FUROSEMIDE 40 MG PO TABS: 40.0000 mg | ORAL_TABLET | Freq: Every day | ORAL | 0 refills | Status: DC

## 2021-06-13 MED ORDER — CLOPIDOGREL BISULFATE 75 MG PO TABS
75.0000 mg | ORAL_TABLET | Freq: Every day | ORAL | Status: DC
  Administered 2021-06-13 – 2021-06-15 (×3): 75 mg via ORAL
  Filled 2021-06-13 (×3): qty 1

## 2021-06-13 MED FILL — Heparin Sodium (Porcine) Inj 1000 Unit/ML: INTRAMUSCULAR | Qty: 10 | Status: AC

## 2021-06-13 MED FILL — Sodium Chloride IV Soln 0.9%: INTRAVENOUS | Qty: 2000 | Status: AC

## 2021-06-13 MED FILL — Electrolyte-R (PH 7.4) Solution: INTRAVENOUS | Qty: 3000 | Status: AC

## 2021-06-13 MED FILL — Lidocaine HCl (Cardiac) IV PF Soln 100 MG/5ML (2%): INTRAVENOUS | Qty: 5 | Status: AC

## 2021-06-13 MED FILL — Albumin, Human Inj 5%: INTRAVENOUS | Qty: 250 | Status: AC

## 2021-06-13 MED FILL — Sodium Bicarbonate IV Soln 8.4%: INTRAVENOUS | Qty: 50 | Status: AC

## 2021-06-13 MED FILL — Mannitol IV Soln 20%: INTRAVENOUS | Qty: 500 | Status: AC

## 2021-06-13 NOTE — Progress Notes (Signed)
EPWs removed per order.  Pt tolerated well.  Sites unremarkable, all tips intact.  CT sutures removed as well, and steri's applied.  Pt understands bedrest for one hour with frequent VS.  Will notify CCMD and monitor closely

## 2021-06-13 NOTE — Progress Notes (Signed)
Mobility Specialist: Progress Note   06/13/21 1714  Mobility  Activity Refused mobility   Pt refused mobility d/t previous emesis episode this afternoon and discharging soon.   [facility] Ameliah Baskins Mobility Specialist Mobility Specialist Phone #1: [phone] Mobility Specialist Phone #2: [phone]

## 2021-06-13 NOTE — TOC Benefit Eligibility Note (Signed)
Patient Teacher, English as a foreign language completed.    The patient is currently admitted and upon discharge could be taking Colchicine 0.6 mg tablets.  The current 30 day co-pay is, $47.00.   The patient is insured through [employer], [place] Patient Advocate Specialist [place] Patient Advocate Team Direct Number: [phone]  Fax: [phone]

## 2021-06-13 NOTE — Progress Notes (Addendum)
[address]       [place] [address]             [phone]      4 Days Post-Op Procedure(s) (LRB): CORONARY ARTERY BYPASS GRAFTING (CABG), ON PUMP, TIMES FIVE, USING LEFT INTERNAL MAMMARY ARTERY AND RIGHT ENDOSCOPICALLY HARVESTED GREATER SAPHENOUS VEIN (N/A) TRANSESOPHAGEAL ECHOCARDIOGRAM (TEE) (N/A) Subjective:   Resting in bed, no new concerns.  He is independent with transfers and ambulation using a rolling walker.  Amiodarone infusing, no further atrial fibrillation since initial onset early yesterday morning.   Objective: Vital signs in last 24 hours: Temp:  [98.1 F (36.7 C)-98.5 F (36.9 C)] 98.1 F (36.7 C) (12/02 0733) Pulse Rate:  [71-80] 76 (12/02 0733) Cardiac Rhythm: Normal sinus rhythm (12/01 2140) Resp:  [20-23] 21 (12/02 0733) BP: (106-145)/(54-78) 106/59 (12/02 0733) SpO2:  [90 %-100 %] 90 % (12/02 0733) Weight:  [70.8 kg] 70.8 kg (12/02 0552)     Intake/Output from previous day: 12/01 0701 - 12/02 0700 In: 717.6 [P.O.:240; I.V.:477.6] Out: 625 [Urine:625] Intake/Output this shift: No intake/output data recorded.  General appearance: alert, cooperative, and no distress Neurologic: intact Heart: Maintaining sinus rhythm  lungs: breath sounds are clear Abdomen: soft, NT Extremities: well perfused, the RLE EVH incision is intact and dry Wound: the sternotomy incision is also well approximated and dry  Lab Results: Recent Labs    06/11/21 0419 06/12/21 0307  WBC 11.6* 12.8*  HGB 8.7* 9.9*  HCT 26.5* 30.0*  PLT 118* 183    BMET:  Recent Labs    06/12/21 0307 06/13/21 0231  NA 134* 131*  K 4.7 4.0  CL 101 101  CO2 27 25  GLUCOSE 146* 148*  BUN 19 22  CREATININE 1.02 1.12  CALCIUM 8.6* 8.2*     PT/INR:  No results for input(s): LABPROT, INR in the last 72 hours.  ABG    Component Value Date/Time   PHART 7.332 (L) 06/09/2021 1732   HCO3 21.5 06/09/2021 1732   TCO2 23 06/09/2021 1732   ACIDBASEDEF  4.0 (H) 06/09/2021 1732   O2SAT 100.0 06/09/2021 1732   CBG (last 3)  Recent Labs    06/12/21 0619 06/12/21 1103 06/12/21 1358  GLUCAP 179* 178* 148*     Assessment/Plan: S/P Procedure(s) (LRB): CORONARY ARTERY BYPASS GRAFTING (CABG), ON PUMP, TIMES FIVE, USING LEFT INTERNAL MAMMARY ARTERY AND RIGHT ENDOSCOPICALLY HARVESTED GREATER SAPHENOUS VEIN (N/A) TRANSESOPHAGEAL ECHOCARDIOGRAM (TEE) (N/A)  -POD4 CABG x 5 for MVCAD  after presenting with NSTEMI, pre-op EF 50-55%.  Had some ST elevations early post-op, evaluated with echo showing EF 60-65% and no new WMA's.  Pericarditis suspected, started on colchicine. Mr Mcmurtrey is progressing well with mobility.  Continue ASA, Lipitor, metoprolol.  Remove pacer wires this morning.  Add Plavix for non-STEMI on admission.  -Atrial fibrillation-holding sinus rhythm since initial conversion early yesterday.  Convert to oral amiodarone this morning K+ 4.0.    -Expected acute blood loss anemia- Hct stable, monitor.  -DVT PPX- on daily enoxaparin.  -GI- tolerating PO's but still has poor appetite.  -Renal-normal function, appears euvolemic on exam.  Doubt accuracy of weights.  -Disposition- Progressing well POD4 CABG. convert to oral amiodarone this morning.  Remove the pacer wires.  Possible discharge later today or tomorrow morning.   LOS: 9 days    Antony Odea, [place] [phone] 06/13/2021  He had an episode of emesis this afternoon. He attributes it to noise from the  alarms. He denies nausea now, but I am not comfortable with dc this evening. Will monitor overnight, if no further emesis will dc home in AM. If further emesis, will work up accordingly  Remo Lipps C. Roxan Hockey, MD [facility] [phone]

## 2021-06-13 NOTE — Progress Notes (Signed)
CARDIAC REHAB PHASE I   PRE:  Rate/Rhythm: 69 SR    BP: sitting 106/59    SaO2: 100 RA  MODE:  Ambulation: 470 ft   POST:  Rate/Rhythm: 80 SR    BP: sitting 128/71     SaO2: wouldn't register  Pt ambulated without AD, contact guard, no LOB. He has RW at home if he needs it in open spaces. No c/o, sts he could walk farther. To recliner.   Discussed with pt IS, sternal precautions, diet, exercise, and CRPII. Receptive. Will refer to [place].  [phone]   [place], ACSM 06/13/2021 9:07 AM

## 2021-06-13 NOTE — Progress Notes (Addendum)
Progress Note  Patient Name: Adam [name] Date of Encounter: 06/13/2021  [facility] Cardiologist: Rozann Lesches, MD   Subjective   Feels well, would like to discharge.  Inpatient Medications    Scheduled Meds:  acetaminophen  1,000 mg Oral Q6H   Or   acetaminophen (TYLENOL) oral liquid 160 mg/5 mL  1,000 mg Per Tube Q6H   amiodarone  400 mg Oral BID   aspirin EC  81 mg Oral Daily   atorvastatin  80 mg Oral Daily   bisacodyl  10 mg Oral Daily   Or   bisacodyl  10 mg Rectal Daily   clopidogrel  75 mg Oral Daily   colchicine  0.6 mg Oral BID   docusate sodium  200 mg Oral Daily   enoxaparin (LOVENOX) injection  40 mg Subcutaneous QHS   furosemide  40 mg Oral Daily   latanoprost  1 drop Both Eyes QHS   mouth rinse  15 mL Mouth Rinse BID   metoprolol tartrate  12.5 mg Oral BID   Or   metoprolol tartrate  12.5 mg Per Tube BID   mupirocin ointment  1 application Nasal BID   pantoprazole  40 mg Oral Daily   potassium chloride  20 mEq Oral Daily   sodium chloride flush  3 mL Intravenous Q12H   timolol  1 drop Right Eye BID   Continuous Infusions:  sodium chloride     amiodarone 30 mg/hr (06/13/21 0858)   PRN Meds: sodium chloride, alum & mag hydroxide-simeth, magnesium hydroxide, metoprolol tartrate, ondansetron (ZOFRAN) IV, sodium chloride flush, traMADol   Vital Signs    Vitals:   06/12/21 1943 06/12/21 2322 06/13/21 0552 06/13/21 0733  BP: (!) 118/54 129/72 121/76 (!) 106/59  Pulse: 77 80 71 76  Resp: 20 20 20  (!) 21  Temp: 98.5 F (36.9 C) 98.2 F (36.8 C) 98.3 F (36.8 C) 98.1 F (36.7 C)  TempSrc: Oral Oral Oral Oral  SpO2: 100% 100% 100% 90%  Weight:   70.8 kg   Height:        Intake/Output Summary (Last 24 hours) at 06/13/2021 1014 Last data filed at 06/13/2021 0500 Gross per 24 hour  Intake 717.62 ml  Output 625 ml  Net 92.62 ml   Last 3 Weights 06/13/2021 06/12/2021 06/11/2021  Weight (lbs) 156 lb 1.4 oz 154 lb 5.2 oz 148 lb 13 oz   Weight (kg) 70.8 kg 70 kg 67.5 kg      Telemetry    Afib yesterday, now in sinus rhythm in the 80s - Personally Reviewed  ECG    No new tracings - Personally Reviewed  Physical Exam   GEN: No acute distress.   Neck: No JVD Cardiac: RRR, no murmurs, rubs, or gallops.  Respiratory: Clear to auscultation bilaterally. GI: Soft, nontender, non-distended  MS: No edema; No deformity. Neuro:  Nonfocal  Psych: Normal affect  Right radial site C/D/I Sternotomy closed  Labs    High Sensitivity Troponin:   Recent Labs  Lab 06/04/21 1812 06/04/21 2001 06/05/21 0100 06/05/21 0254  TROPONINIHS 2,952* 11,026* >24,000* >24,000*     Chemistry Recent Labs  Lab 06/09/21 1842 06/10/21 0456 06/10/21 1701 06/11/21 0419 06/12/21 0307 06/13/21 0231  NA 135 135 133* 133* 134* 131*  K 4.5 4.3 4.1 3.9 4.7 4.0  CL 107 106 104 102 101 101  CO2 21* 21* 23 25 27 25   GLUCOSE 128* 117* 173* 83 146* 148*  BUN 13 16 20  19  19 22  CREATININE 0.80 0.89 0.95 0.84 1.02 1.12  CALCIUM 8.0* 8.2* 8.2* 8.0* 8.6* 8.2*  MG 3.2* 2.6* 2.3  --   --   --   GFRNONAA >60 >60 >60 >60 >60 >60  ANIONGAP 7 8 6 6 6 5     Lipids No results for input(s): CHOL, TRIG, HDL, LABVLDL, LDLCALC, CHOLHDL in the last 168 hours.  Hematology Recent Labs  Lab 06/10/21 1701 06/11/21 0419 06/12/21 0307  WBC 15.4* 11.6* 12.8*  RBC 2.80* 2.70* 3.13*  HGB 9.0* 8.7* 9.9*  HCT 27.2* 26.5* 30.0*  MCV 97.1 98.1 95.8  MCH 32.1 32.2 31.6  MCHC 33.1 32.8 33.0  RDW 12.5 12.7 12.4  PLT 136* 118* 183   Thyroid No results for input(s): TSH, FREET4 in the last 168 hours.  BNPNo results for input(s): BNP, PROBNP in the last 168 hours.  DDimer No results for input(s): DDIMER in the last 168 hours.   Radiology    DG Chest 2 View  Result Date: 06/12/2021 CLINICAL DATA:  Post CABG EXAM: CHEST - 2 VIEW COMPARISON:  06/11/2021 FINDINGS: Decreased edema, pleural effusions, and bibasilar atelectasis. Possible trace left apical  pneumothorax remains present. Stable cardiomediastinal contours. Post CABG. IMPRESSION: Improved lung aeration.  Possible trace left pneumothorax remains. Electronically Signed   By: Macy Mis M.D.   On: 06/12/2021 09:39    Cardiac Studies   Limited echo 06/10/21: 1. Limted TTE for wall motion. Contrast study shows normal LVEF 60-65%  with no wall motion abnormalities.   2. Left ventricular ejection fraction, by estimation, is 60 to 65%. The  left ventricle has normal function. The left ventricle has no regional  wall motion abnormalities.   3. Right ventricular systolic function was not well visualized. The right  ventricular size is not well visualized.    Left heart cath 06/06/21:   Dist LM lesion is 30% stenosed.   Ost LAD lesion is 70% stenosed.   Prox LAD to Mid LAD lesion is 90% stenosed.   Mid LAD to Dist LAD lesion is 90% stenosed.   Prox Cx to Mid Cx lesion is 100% stenosed.   Ramus lesion is 95% stenosed.   Lat Ramus lesion is 90% stenosed.   Prox RCA lesion is 95% stenosed.   Mid RCA lesion is 60% stenosed.   There is mild left ventricular systolic dysfunction.   LV end diastolic pressure is normal.   The left ventricular ejection fraction is 50-55% by visual estimate.   1.  Mild distal left main stenosis 2.  Severe multisegment LAD stenoses with 70 to 75% ostial stenosis, 95% proximal stenosis, and 95% mid vessel stenosis 3.  Severe bifurcation stenosis involving the intermediate branch with 90 to 95% lesions in both sub-branches 4.  Total occlusion of the mid circumflex at a small first OM branch. 5.  Severe proximal stenosis of the RCA and moderate stenosis of the mid RCA and an area of tortuosity 6.  Mild segmental LV dysfunction with inferior wall hypokinesis, LVEF estimated at approximately 50 to 55%.   Recommendations: T CTS consultation for consideration of multivessel CABG.  If the patient is not a candidate for CABG, could consider multivessel PCI as an  alternative. Anatomy not favorable for PCI with ostial LAD stenosis, bifurcation disease of the ramus, and large extent of obstructive disease.   Patient Profile     80 y.o. male with hyperlipidemia, history of stroke who was admitted on 06/05/2021 with non-STEMI.  Left heart  catheterization with three-vessel CAD now s/p CABG.  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 - he was started on colchicine - limited echo with preserved EF - avoided ibuprofen given DAPT   Post-op Afib - converted to sinus rhythm with IV amiodarone 150 mg bolus --> infusion - will start 400 mg amiodarone BID today - given DAPT and no further recurrence, he was not started on T[place] - continue 400 mg amiodarone BID x 7 days, then reduce to 200 mg daily - plan for amiodarone for 3 months and then attempt to taper - he was not aware of his AFib   Hyperlipidemia with LDL goal < 70 06/05/2021: Cholesterol 150; HDL 49; LDL Cholesterol 91; Triglycerides 48; VLDL 10 New Lipitor 80 mg this admission. Recheck lipid in 6 weeks,     Continue amiodarone 400 mg BID x 7 days, then 200 mg daily Continue colchicine x 3 months Continue 81 mg ASA and plavix, BB, 80 mg lipitor Cardiology follow up has been arranged.      For questions or updates, please contact [place] Please consult [email or web address] for contact info under        Signed, Ledora Bottcher, PA  06/13/2021, 10:14 AM     I have examined the patient and reviewed assessment and plan and discussed with patient.  Agree with above as stated.    Back in NSR.  On DAPT for NSTEMI.  No OAC at this time. Amiodarone taper.  Amio will hopefully not need to be a long  term medicine for him.   COntinue aggressive secondary prevention.   Larae Grooms

## 2021-06-14 MED ORDER — TIMOLOL MALEATE 0.5 % OP SOLN
1.0000 [drp] | Freq: Two times a day (BID) | OPHTHALMIC | Status: DC
  Administered 2021-06-14 – 2021-06-15 (×2): 1 [drp] via OPHTHALMIC

## 2021-06-14 MED ORDER — METOPROLOL TARTRATE 25 MG PO TABS
25.0000 mg | ORAL_TABLET | Freq: Two times a day (BID) | ORAL | Status: DC
  Administered 2021-06-14 – 2021-06-15 (×2): 25 mg via ORAL
  Filled 2021-06-14 (×2): qty 1

## 2021-06-14 MED ORDER — BRIMONIDINE TARTRATE 0.2 % OP SOLN
1.0000 [drp] | Freq: Two times a day (BID) | OPHTHALMIC | Status: DC
  Administered 2021-06-14 – 2021-06-15 (×2): 1 [drp] via OPHTHALMIC
  Filled 2021-06-14: qty 5

## 2021-06-14 NOTE — Progress Notes (Signed)
Patient reverted back to normal sinus rhythm approximately 60 minutes after PRN metoprolol. Instructed patient to stay in bed and use urinal instead of ambulating to bathroom for the remainder of the night Continued to monitor.

## 2021-06-14 NOTE — Progress Notes (Signed)
Mobility Specialist: Progress Note   06/14/21 1226  Mobility  Activity Ambulated in hall  Level of Assistance Independent  Distance Ambulated (ft) 470 ft  Mobility Ambulated independently in hallway  Mobility Response Tolerated well  Mobility performed by Mobility specialist  Bed Position Chair  $Mobility charge 1 Mobility   During Mobility: 75 HR Post-Mobility: 69 HR  Pt had no c/o during ambulation. Pt to recliner after walk with call bell and phone at his side.   [facility] Andie Mortimer Mobility Specialist Mobility Specialist Phone #1: [phone] Mobility Specialist Phone #2: [phone]

## 2021-06-14 NOTE — Progress Notes (Signed)
Pt ambulated with RN in hall with no assist device, 415ft.  No complaints, no tele alarms.  Will cont plan of care

## 2021-06-14 NOTE — Progress Notes (Signed)
Received call from [place] around midnight to advise patient with HR in the 120s. This RN observed patient in atrial fib. Rhythm. Treated patient with prn metoprolol, patient was up to bathroom when he went into afib.

## 2021-06-14 NOTE — Progress Notes (Addendum)
[name]       Adam [name],[name] [address]             [phone]      5 Days Post-Op Procedure(s) (LRB): CORONARY ARTERY BYPASS GRAFTING (CABG), ON PUMP, TIMES FIVE, USING LEFT INTERNAL MAMMARY ARTERY AND RIGHT ENDOSCOPICALLY HARVESTED GREATER SAPHENOUS VEIN (N/A) TRANSESOPHAGEAL ECHOCARDIOGRAM (TEE) (N/A) Subjective: Had some further N/V last pm he attributed to dinner being too 'Heavy"  for stomach to handle- feels better now  Had recurrent afib with RVR  Objective: Vital signs in last 24 hours: Temp:  [98.1 F (36.7 C)-98.4 F (36.9 C)] 98.1 F (36.7 C) (12/03 0400) Cardiac Rhythm: Normal sinus rhythm;Bundle branch block (12/03 0702) Resp:  [22] 22 (12/02 1336) BP: (104-124)/(68-82) 115/70 (12/03 0400) SpO2:  [98 %] 98 % (12/02 1147)  Hemodynamic parameters for last 24 hours:    Intake/Output from previous day: 12/02 0701 - 12/03 0700 In: -  Out: 175 [Urine:175] Intake/Output this shift: No intake/output data recorded.  General appearance: alert, cooperative, and no distress Heart: regular rate and rhythm Lungs: min dim in bases Abdomen: benign, + BS, non tender or distended Extremities: min edema Wound: incis healing well  Lab Results: Recent Labs    06/12/21 0307  WBC 12.8*  HGB 9.9*  HCT 30.0*  PLT 183   BMET:  Recent Labs    06/12/21 0307 06/13/21 0231  NA 134* 131*  K 4.7 4.0  CL 101 101  CO2 27 25  GLUCOSE 146* 148*  BUN 19 22  CREATININE 1.02 1.12  CALCIUM 8.6* 8.2*    PT/INR: No results for input(s): LABPROT, INR in the last 72 hours. ABG    Component Value Date/Time   PHART 7.332 (L) 06/09/2021 1732   HCO3 21.5 06/09/2021 1732   TCO2 23 06/09/2021 1732   ACIDBASEDEF 4.0 (H) 06/09/2021 1732   O2SAT 100.0 06/09/2021 1732   CBG (last 3)  Recent Labs    06/12/21 0619 06/12/21 1103 06/12/21 1358  GLUCAP 179* 178* 148*    Meds Scheduled Meds:  acetaminophen  1,000 mg Oral Q6H   Or   acetaminophen  (TYLENOL) oral liquid 160 mg/5 mL  1,000 mg Per Tube Q6H   amiodarone  400 mg Oral BID   aspirin EC  81 mg Oral Daily   atorvastatin  80 mg Oral Daily   bisacodyl  10 mg Oral Daily   Or   bisacodyl  10 mg Rectal Daily   clopidogrel  75 mg Oral Daily   colchicine  0.6 mg Oral BID   docusate sodium  200 mg Oral Daily   enoxaparin (LOVENOX) injection  40 mg Subcutaneous QHS   furosemide  40 mg Oral Daily   latanoprost  1 drop Both Eyes QHS   mouth rinse  15 mL Mouth Rinse BID   metoprolol tartrate  12.5 mg Oral BID   Or   metoprolol tartrate  12.5 mg Per Tube BID   mupirocin ointment  1 application Nasal BID   pantoprazole  40 mg Oral Daily   potassium chloride  20 mEq Oral Daily   sodium chloride flush  3 mL Intravenous Q12H   timolol  1 drop Right Eye BID   Continuous Infusions:  sodium chloride     PRN Meds:.sodium chloride, alum & mag hydroxide-simeth, magnesium hydroxide, metoprolol tartrate, ondansetron (ZOFRAN) IV, sodium chloride flush, traMADol  Xrays DG Chest 2 View  Result Date: 06/12/2021 CLINICAL DATA:  Post CABG EXAM: CHEST - 2 VIEW COMPARISON:  06/11/2021 FINDINGS: Decreased edema, pleural effusions, and bibasilar atelectasis. Possible trace left apical pneumothorax remains present. Stable cardiomediastinal contours. Post CABG. IMPRESSION: Improved lung aeration.  Possible trace left pneumothorax remains. Electronically Signed   By: Macy Mis M.D.   On: 06/12/2021 09:39    Assessment/Plan: S/P Procedure(s) (LRB): CORONARY ARTERY BYPASS GRAFTING (CABG), ON PUMP, TIMES FIVE, USING LEFT INTERNAL MAMMARY ARTERY AND RIGHT ENDOSCOPICALLY HARVESTED GREATER SAPHENOUS VEIN (N/A) TRANSESOPHAGEAL ECHOCARDIOGRAM (TEE) (N/A)   1 afeb, VSS ,discharge cancelled d/t recurrent afib, conts po amio, received extra dose metoprolol- sinus with BBB-  2 sats good on RA 3 wt about 4 kg >preop, UOP not measured- cont lasix 4 minor hyponatremia, K+ ok 5 BS adeq control, Hg A1c 6.1  on no DM meds 6 may need to consider [place] with afib recurrence 7 on lovenox for DVT ppx 8 N/V appears pretty self limiting but if persists may need to consider stopping amio , has MOM ordered for constipation, slow with diet/intake 9 routine pulm toilet/rehab 10 poss home in am   LOS: 10 days    Adam Giovanni PA-C Pager [phone] 06/14/2021    Agree with above Afib with RVR overnight Going up on [place]

## 2021-06-15 LAB — MAGNESIUM: Magnesium: 2 mg/dL (ref 1.7–2.4)

## 2021-06-15 LAB — BASIC METABOLIC PANEL
Anion gap: 6 (ref 5–15)
BUN: 18 mg/dL (ref 8–23)
CO2: 28 mmol/L (ref 22–32)
Calcium: 8.2 mg/dL — ABNORMAL LOW (ref 8.9–10.3)
Chloride: 101 mmol/L (ref 98–111)
Creatinine, Ser: 1.1 mg/dL (ref 0.61–1.24)
GFR, Estimated: >60 mL/min (ref >60)
Glucose, Bld: 147 mg/dL — ABNORMAL HIGH (ref 70–99)
Potassium: 3.5 mmol/L (ref 3.5–5.1)
Sodium: 135 mmol/L (ref 135–145)

## 2021-06-15 MED ORDER — METOPROLOL TARTRATE 25 MG PO TABS: 25.0000 mg | ORAL_TABLET | Freq: Two times a day (BID) | ORAL | 1 refills | Status: DC

## 2021-06-15 NOTE — Progress Notes (Signed)
[id number]       [place] [id number]             [phone]      6 Days Post-Op Procedure(s) (LRB): CORONARY ARTERY BYPASS GRAFTING (CABG), ON PUMP, TIMES FIVE, USING LEFT INTERNAL MAMMARY ARTERY AND RIGHT ENDOSCOPICALLY HARVESTED GREATER SAPHENOUS VEIN (N/A) TRANSESOPHAGEAL ECHOCARDIOGRAM (TEE) (N/A) Subjective: Feels well, no further n/v or atrial fib  Objective: Vital signs in last 24 hours: Temp:  [97.6 F (36.4 C)-98.3 F (36.8 C)] 98.3 F (36.8 C) (12/04 0736) Pulse Rate:  [60-78] 78 (12/04 0736) Cardiac Rhythm: Normal sinus rhythm;Bundle branch block (12/04 0711) Resp:  [13-20] 16 (12/04 0736) BP: (98-143)/(54-78) 129/78 (12/04 0736) SpO2:  [98 %-100 %] 100 % (12/04 0736) Weight:  [66.7 kg] 66.7 kg (12/04 0500)  Hemodynamic parameters for last 24 hours:    Intake/Output from previous day: 12/03 0701 - 12/04 0700 In: 200 [P.O.:197; I.V.:3] Out: 50 [Urine:50] Intake/Output this shift: No intake/output data recorded.  General appearance: alert, cooperative, and no distress Heart: regular rate and rhythm Lungs: clear to auscultation bilaterally Abdomen: benign Extremities: minor edema Wound: incis healing well  Lab Results: No results for input(s): WBC, HGB, HCT, PLT in the last 72 hours. BMET:  Recent Labs    06/13/21 0231 06/15/21 0103  NA 131* 135  K 4.0 3.5  CL 101 101  CO2 25 28  GLUCOSE 148* 147*  BUN 22 18  CREATININE 1.12 1.10  CALCIUM 8.2* 8.2*    PT/INR: No results for input(s): LABPROT, INR in the last 72 hours. ABG    Component Value Date/Time   PHART 7.332 (L) 06/09/2021 1732   HCO3 21.5 06/09/2021 1732   TCO2 23 06/09/2021 1732   ACIDBASEDEF 4.0 (H) 06/09/2021 1732   O2SAT 100.0 06/09/2021 1732   CBG (last 3)  Recent Labs    06/12/21 1103 06/12/21 1358  GLUCAP 178* 148*    Meds Scheduled Meds:  amiodarone  400 mg Oral BID   aspirin EC  81 mg Oral Daily   atorvastatin  80 mg Oral Daily    bisacodyl  10 mg Oral Daily   Or   bisacodyl  10 mg Rectal Daily   brimonidine  1 drop Both Eyes BID   clopidogrel  75 mg Oral Daily   colchicine  0.6 mg Oral BID   docusate sodium  200 mg Oral Daily   enoxaparin (LOVENOX) injection  40 mg Subcutaneous QHS   furosemide  40 mg Oral Daily   latanoprost  1 drop Both Eyes QHS   mouth rinse  15 mL Mouth Rinse BID   metoprolol tartrate  25 mg Oral BID   pantoprazole  40 mg Oral Daily   potassium chloride  20 mEq Oral Daily   sodium chloride flush  3 mL Intravenous Q12H   timolol  1 drop Both Eyes BID   Continuous Infusions:  sodium chloride     PRN Meds:.sodium chloride, alum & mag hydroxide-simeth, magnesium hydroxide, metoprolol tartrate, ondansetron (ZOFRAN) IV, sodium chloride flush, traMADol  Xrays No results found.  Assessment/Plan: S/P Procedure(s) (LRB): CORONARY ARTERY BYPASS GRAFTING (CABG), ON PUMP, TIMES FIVE, USING LEFT INTERNAL MAMMARY ARTERY AND RIGHT ENDOSCOPICALLY HARVESTED GREATER SAPHENOUS VEIN (N/A) TRANSESOPHAGEAL ECHOCARDIOGRAM (TEE) (N/A)  1 afeb, VSS, s BP 98-143, sinus w/ BBB, no further afib 2 sats good on RA 3 UOP- voiding, not measured 4 normal renal fxn, K+ 3.5- weight at preop- will d/c diuretics  5 tolerating diet  6 ambulating well 7 stable for d/c    LOS: 11 days    John Giovanni 06/15/2021

## 2021-06-18 ENCOUNTER — Telehealth: Payer: Self-pay | Admitting: *Deleted

## 2021-06-18 NOTE — Telephone Encounter (Signed)
Patient's daughter, Jackelyn Poling, contacted the office stating patient's weight has steadily increased since hospital discharge. Per Jackelyn Poling, patient's weight 12/5 140lb, 12/6 142lb, 12/7 144lb. Weight upon discharge was 147lb. Per daughter, patient's appetite has increased since discharge. Daughter denies any SOB. Debbie states patient's left leg shows no signs of edema, right ankle is slightly swollen. Patient is currently elevating legs. Advised daughter to continue to monitor patient and record weights. Advised Debbie to call our office back if his legs begin to swell or he starts to become SOB. Debbie verbalizes understanding.

## 2021-06-18 NOTE — Discharge Summary (Addendum)
Patient ID: Adam [name] MRN: [id number] DOB/AGE: 1941/04/18 80 y.o.   Admit date: 06/04/2021 Discharge date: 06/15/2021 Admission Diagnoses: Chest pain Multivessel coronary artery disease Acute non-ST elevation myocardial infarction     Discharge Diagnoses:    Chest pain Multivessel coronary artery disease NSTEMI (non-ST elevated myocardial infarction) ([place]) S/P CABG x 5 Postoperative pericarditis Expected acute blood loss anemia     Discharged Condition: good   History of Present Illness: The patient is a 80 year old gentleman with a history of hyperlipidemia, diet-controlled diabetes, prior stroke symptoms in the right eye with right carotid endarterectomy and resolution of his symptoms, he reports being asymptomatic until he woke up this past Wednesday with aching pain in his back that he rated a 6/10 in severity.  He said that he got up and stretched and the pain went away and he went back to bed.  The following day he was lifting logs from his front porch and carrying them inside with his arms over his head.  He developed the same back pain as well as pain in both upper extremities which improved with rest.  He also developed some mild shortness of breath and nausea and presented to [facility] for evaluation.  His initial high-sensitivity troponin was 2952 and increased to greater than 24,000.  He ruled in for a NSTEMI.  He underwent cardiac catheterization yesterday which showed severe multivessel coronary disease with left ventricular ejection fraction of 50 to 55%.  His disease not felt to be ideal for PCI with a high-grade ostial LAD stenosis, bifurcation disease of the ramus branch, and multivessel coronary obstruction.  A 2D echo today showed an ejection fraction of 55 to 60% with no regional wall motion abnormality.  There is mild mitral valve regurgitation.  There is no aortic stenosis or insufficiency.  He has had no further chest or back discomfort since  admission. Coronary bypass grafting was offered to Adam [name] and discussed with him in detail.  He decided to proceed with surgery.   Hospital Course:  Adam [name] remained stable following left heart catheterization.  He was taken to the operating room 06/09/2021 where CABG x5 was carried out without complication.  Following the procedure, he separated from cardiopulmonary bypass without difficulty.  He was transferred to the surgical ICU in stable condition.  He remained stable hemodynamically.  He was easily weaned from mechanical ventilation and extubated by mid afternoon on the day of surgery.  The EKG obtained on the morning after surgery showed anterior ST elevation.  He was noted to have a loud rub on physical exam at the same time.  Dr. Roxan Hockey reviewed the EKG with Dr. Davina Poke and they concurred that the EKG changes likely represented pericarditis.  Adam [name] was started on colchicine.  An echocardiogram was obtained that showed preserved EF and no new wall motion abnormalities.  The patient underwent removal of all chest tubes and arterial lines without difficulty.  He was felt stable for transfer to the progressive care unit on 06/11/2021.  He developed Atrial Fibrillation with RVR.  He was treated with IV Amiodarone and converted back to NSR.  He was converted to an oral regimen of Amiodarone.  He did have a recurrence but did stabilize to sinus rhythm.  Some difficulty with nausea and vomiting with this as improved with time.  His pacing wires were removed without difficulty.  The patient has remained clinically stable.  His surgical incisions are healing without evidence of infection.  At the  time of discharge the patient is felt to be quite stable.   Significant Diagnostic Studies: angiography:      Dist LM lesion is 30% stenosed.   Ost LAD lesion is 70% stenosed.   Prox LAD to Mid LAD lesion is 90% stenosed.   Mid LAD to Dist LAD lesion is 90% stenosed.   Prox Cx to Mid Cx lesion is  100% stenosed.   Ramus lesion is 95% stenosed.   Lat Ramus lesion is 90% stenosed.   Prox RCA lesion is 95% stenosed.   Mid RCA lesion is 60% stenosed.   There is mild left ventricular systolic dysfunction.   LV end diastolic pressure is normal.   The left ventricular ejection fraction is 50-55% by visual estimate.   1.  Mild distal left main stenosis 2.  Severe multisegment LAD stenoses with 70 to 75% ostial stenosis, 95% proximal stenosis, and 95% mid vessel stenosis 3.  Severe bifurcation stenosis involving the intermediate branch with 90 to 95% lesions in both sub-branches 4.  Total occlusion of the mid circumflex at a small first OM branch. 5.  Severe proximal stenosis of the RCA and moderate stenosis of the mid RCA and an area of tortuosity 6.  Mild segmental LV dysfunction with inferior wall hypokinesis, LVEF estimated at approximately 50 to 55%.   Recommendations: T CTS consultation for consideration of multivessel CABG.  If the patient is not a candidate for CABG, could consider multivessel PCI as an alternative. Anatomy not favorable for PCI with ostial LAD stenosis, bifurcation disease of the ramus, and large extent of obstructive disease.    ECHO:   IMPRESSIONS     1. Limted TTE for wall motion. Contrast study shows normal LVEF 60-65%  with no wall motion abnormalities.   2. Left ventricular ejection fraction, by estimation, is 60 to 65%. The  left ventricle has normal function. The left ventricle has no regional  wall motion abnormalities.   3. Right ventricular systolic function was not well visualized. The right  ventricular size is not well visualized.    Treatments: Surgery Operative Report    DATE OF PROCEDURE: 06/09/2021   PREOPERATIVE DIAGNOSIS:  Three-vessel coronary artery disease.   POSTOPERATIVE DIAGNOSIS:  Three-vessel coronary artery disease.   PROCEDURES PERFORMED:  Median sternotomy, extracorporeal circulation, coronary artery bypass grafting x5,  left internal mammary artery to LAD, saphenous vein graft to posterior descending, sequential saphenous vein graft to ramus intermedius 1 and 2 and  OM1.   SURGEON:  Modesto Charon, MD   ASSISTANT:  Enid Cutter, PA    ANESTHESIA:  General.   FINDINGS:  Transesophageal echocardiography showed preserved left ventricular function with no significant valvular pathology.  Vein fair quality.  Mammary good quality. PDA and OM1 fair quality targets. LAD and ramus branches good quality targets.   CLINICAL NOTE:  The patient is a 80 year old gentleman who presented with a non-ST elevation MI.  At catheterization, he was found to have severe 3-vessel coronary artery disease and he was referred for coronary artery bypass grafting.  The indications,  risks, benefits, and alternatives were discussed in detail with the patient by Dr. Cyndia Bent.  He accepted those risks and agreed to proceed.  I met with the patient preoperatively and he had no further questions about the procedure.   Discharge Exam: By Enid Cutter PA-C Blood pressure 129/78, pulse 78, temperature 98.3 F (36.8 C), temperature source Oral, resp. rate 16, height _0  (1.778 m), weight 66.7 kg, SpO2 100 %.  General appearance: alert, cooperative, and no distress Neurologic: intact Heart: Maintaining sinus rhythm  lungs: breath sounds are clear Abdomen: soft, NT Extremities: well perfused, the RLE EVH incision is intact and dry Wound: the sternotomy incision is also well approximated and dry   Discharge disposition: 01-Home or Self Care         Discharge Instructions       Amb Referral to Cardiac Rehabilitation   Complete by: As directed      Diagnosis:  CABG NSTEMI      CABG X ___: 5    After initial evaluation and assessments completed: Virtual Based Care may be provided alone or in conjunction with Phase 2 Cardiac Rehab based on patient barriers.: Yes    Discharge patient   Complete by: As directed       Discharge disposition: 01-Home or Self Care    Discharge patient date: 06/15/2021         Allergies as of 06/15/2021   No Known Allergies         Medication List       STOP taking these medications     HYDROcodone-acetaminophen 5-325 MG tablet Commonly known as: Norco    ibuprofen 200 MG tablet Commonly known as: ADVIL    niacin 500 MG tablet           TAKE these medications     acetaminophen 500 MG tablet Commonly known as: TYLENOL Take 2 tablets (1,000 mg total) by mouth every 6 (six) hours as needed.    amiodarone 200 MG tablet Commonly known as: PACERONE Take 2 tablets (400 mg total) by mouth 2 (two) times daily. X 7 days, then 200 mg daily    aspirin EC 81 MG tablet Take 81 mg by mouth daily as needed (blood thinner). Swallow whole. What changed: Another medication with the same name was removed. Continue taking this medication, and follow the directions you see here.    atorvastatin 80 MG tablet Commonly known as: LIPITOR Take 1 tablet (80 mg total) by mouth daily.    brimonidine 0.2 % ophthalmic solution Commonly known as: ALPHAGAN INSTILL 1 DROP INTO RIGHT EYE TWICE DAILY    Chromium 1 MG Caps Take 1 mg by mouth daily.    clopidogrel 75 MG tablet Commonly known as: PLAVIX Take 1 tablet (75 mg total) by mouth daily.    colchicine 0.6 MG tablet Take 1 tablet (0.6 mg total) by mouth 2 (two) times daily.    latanoprost 0.005 % ophthalmic solution Commonly known as: XALATAN Place 1 drop into both eyes at bedtime.    MAGNESIUM PO Take 250 mg by mouth once a week.    metoprolol tartrate 25 MG tablet Commonly known as: LOPRESSOR Take 1 tablet (25 mg total) by mouth 2 (two) times daily.    timolol 0.5 % ophthalmic solution Commonly known as: BETIMOL Place 1 drop into the right eye daily.    timolol 0.5 % ophthalmic solution Commonly known as: TIMOPTIC INSTILL 1 DROP INTO RIGHT EYE ONCE DAILY What changed: See the new instructions.    traMADol  50 MG tablet Commonly known as: ULTRAM Take 1 tablet (50 mg total) by mouth every 4 (four) hours as needed for moderate pain.    VANADIUM PO Take 1 tablet by mouth daily.    VITAMIN D3 PO Take 1 tablet by mouth daily.             Follow-up Information       Ahmed Prima,  Fransisco Hertz, PA-C. Go on 07/11/2021.   Specialties: Physician Assistant, Cardiology Why: Your appointment is at 3:30pm Contact information: [place] [address] [phone]              [name], MD Follow up.   Specialty: Cardiothoracic Surgery Why: Your appointment is at 12:45 Please arrive 30 minutes early for chest x-ray to be performed by [facility] located on the first floor of the same building. Contact information: [place] [address] [phone]                        The patient has been discharged on:     1.Beta Blocker:  Yes [  X ]                              No   [   ]                              If No, reason:   2.Ace Inhibitor/ARB: Yes [   ]                                     No  [ X   ]                                     If No, reason: labile BP   3.Statin:   Yes [ X  ]                  No  [   ]                  If No, reason:   4.Ecasa:  Yes  [ X  ]                  No   [   ]                  If No, reason:   5. Plavix:   Yes [ X  ]                     No [   ] Signed: John Giovanni, PA-C 06/16/2021, 1:50 PM        Cosigned by: [name], MD at 06/17/2021  6:10 PM

## 2021-06-23 ENCOUNTER — Telehealth: Payer: Self-pay | Admitting: *Deleted

## 2021-06-23 NOTE — Telephone Encounter (Signed)
Contacted Debbie, patient's daughter, to follow up on call placed to answering service over the weekend. Per daughter, patient was throwing up and had diarrhea yesterday. Jackelyn Poling states she spoke with Dr. Roxan Hockey yesterday afternoon who recommended patient go to an urgent care. Per daughter, the patient chose to wait it out. Patient states he feels much better today. Patient has an appt with his PCP tomorrow. No further questions or concerns at this time.

## 2021-06-24 DIAGNOSIS — I214 Non-ST elevation (NSTEMI) myocardial infarction: Secondary | ICD-10-CM | POA: Diagnosis not present

## 2021-06-24 DIAGNOSIS — R7301 Impaired fasting glucose: Secondary | ICD-10-CM | POA: Diagnosis not present

## 2021-06-24 DIAGNOSIS — D509 Iron deficiency anemia, unspecified: Secondary | ICD-10-CM | POA: Diagnosis not present

## 2021-06-24 DIAGNOSIS — Z951 Presence of aortocoronary bypass graft: Secondary | ICD-10-CM | POA: Diagnosis not present

## 2021-06-24 DIAGNOSIS — E782 Mixed hyperlipidemia: Secondary | ICD-10-CM | POA: Diagnosis not present

## 2021-06-24 DIAGNOSIS — I480 Paroxysmal atrial fibrillation: Secondary | ICD-10-CM | POA: Diagnosis not present

## 2021-06-24 DIAGNOSIS — R197 Diarrhea, unspecified: Secondary | ICD-10-CM | POA: Diagnosis not present

## 2021-06-24 DIAGNOSIS — R112 Nausea with vomiting, unspecified: Secondary | ICD-10-CM | POA: Diagnosis not present

## 2021-06-27 ENCOUNTER — Telehealth: Payer: Self-pay | Admitting: Cardiology

## 2021-06-27 NOTE — Telephone Encounter (Signed)
Pt c/o medication issue:  1. Name of Medication: colchicine 0.6 MG tablet  2. How are you currently taking this medication (dosage and times per day)? 1 tablet daily  3. Are you having a reaction (difficulty breathing--STAT)? no  4. What is your medication issue? Patient's daughter states the patient has been vomiting and having diarhea. She says he saw his PCP and they prescribed him an anti nausea medication, which has helped. She says he is also taking align to restore his gut flora after taking the antibiotics for his procedure. She states they are concerned the colchicine is what is messing up his digestive system and would like to know if he can stop taking it. She would also like to know if he can start taking prilosec to reduce acid.

## 2021-06-30 ENCOUNTER — Telehealth: Payer: Self-pay | Admitting: *Deleted

## 2021-06-30 NOTE — Telephone Encounter (Signed)
I spoke with daughter and she will call [facility] who prescribed colchicine for patient. Dr.McDowell was on call with [facility] when consulted on this patient. We will see Mr.Cothron in office on 07/11/21 with our PA,Brittany Strader

## 2021-06-30 NOTE — Telephone Encounter (Signed)
Patient's daughter contacted the office stating patient is having constant diarrhea. She states patient went to his PCP last week and recommended patient may be experiencing discomfort r/t colchicine. Per Evonnie Pat, PA, patient advised to cut back from taking medication from twice daily to once daily. Patient's daughter acknowledges receipt and verbalizes understanding.

## 2021-07-02 ENCOUNTER — Other Ambulatory Visit: Payer: Self-pay | Admitting: Thoracic Surgery (Cardiothoracic Vascular Surgery)

## 2021-07-02 DIAGNOSIS — Z951 Presence of aortocoronary bypass graft: Secondary | ICD-10-CM

## 2021-07-11 ENCOUNTER — Encounter: Payer: Self-pay | Admitting: Student

## 2021-07-11 ENCOUNTER — Other Ambulatory Visit: Payer: Self-pay

## 2021-07-11 ENCOUNTER — Ambulatory Visit: Payer: Medicare HMO | Admitting: Student

## 2021-07-11 VITALS — BP 102/58 | HR 53 | Ht 68.0 in | Wt 137.0 lb

## 2021-07-11 DIAGNOSIS — I9789 Other postprocedural complications and disorders of the circulatory system, not elsewhere classified: Secondary | ICD-10-CM | POA: Diagnosis not present

## 2021-07-11 DIAGNOSIS — I2510 Atherosclerotic heart disease of native coronary artery without angina pectoris: Secondary | ICD-10-CM

## 2021-07-11 DIAGNOSIS — E785 Hyperlipidemia, unspecified: Secondary | ICD-10-CM

## 2021-07-11 DIAGNOSIS — I241 Dressler's syndrome: Secondary | ICD-10-CM | POA: Diagnosis not present

## 2021-07-11 DIAGNOSIS — Z951 Presence of aortocoronary bypass graft: Secondary | ICD-10-CM | POA: Diagnosis not present

## 2021-07-11 DIAGNOSIS — I4891 Unspecified atrial fibrillation: Secondary | ICD-10-CM | POA: Diagnosis not present

## 2021-07-11 MED ORDER — METOPROLOL SUCCINATE ER 25 MG PO TB24: 25.0000 mg | ORAL_TABLET | Freq: Every day | ORAL | 2 refills | Status: DC

## 2021-07-11 MED ORDER — CLOPIDOGREL BISULFATE 75 MG PO TABS: 75.0000 mg | ORAL_TABLET | Freq: Every day | ORAL | 3 refills | Status: DC

## 2021-07-11 MED ORDER — AMIODARONE HCL 200 MG PO TABS: 200.0000 mg | ORAL_TABLET | Freq: Every day | ORAL | 1 refills | Status: DC

## 2021-07-11 NOTE — Patient Instructions (Signed)
Medication Instructions:  STOP Lopressor  START Toprol XL 25 mg daily   On 09/09/21, STOP Amiodarone and Colchicine   Labwork: None today  Testing/Procedures: None today  Follow-Up: 3 months  Any Other Special Instructions Will Be Listed Below (If Applicable).  If you need a refill on your cardiac medications before your next appointment, please call your pharmacy.

## 2021-07-11 NOTE — Progress Notes (Signed)
Cardiology Office Note    Date:  07/12/2021   ID:  Vashon, Riordan October 18, 1940, MRN [id number]  PCP:  Celene Squibb, MD  Cardiologist: Rozann Lesches, MD    Chief Complaint  Patient presents with   Hospitalization Follow-up    History of Present Illness:    Adam [name] is a 80 y.o. male with past medical history of HLD, carotid artery stenosis and recent diagnosis of CAD who presents to the office today for hospital follow-up.  He presented to [facility] on 06/04/2021 for evaluation of chest pain which had been occurring for the past few days. He was found to have an NSTEMI with peak troponin greater than 24,000 and cardiac catheterization on 06/06/2021 showed severe multivessel CAD and CT surgery consult was recommended for consideration of CABG. Ultimately underwent CABG by Dr. Roxan Hockey on 06/09/2021 with LIMA-LAD, SVG-PDA, seq SVG-RI1-RI2-OM1. Post-op EKG showed diffuse ST elevation and given a rub on examination, findings were felt to be most consistent with pericarditis/Dressler syndrome and he was started on Colchicine. His EF was at 55 to 60% prior to CABG and repeat limited echo showed this had improved to 60 to 65% with no wall motion abnormalities noted. He did have postop atrial fibrillation and converted to normal sinus rhythm with IV Amiodarone. Was recommended to be on Amiodarone 400 mg twice daily for 7 days then 200 mg daily and eventually discontinue after 3 months. Was discharged home on 06/15/2021.  In talking with the patient and his daughter today, he reports overall doing well since his recent surgery. His stamina continues to improve and he has been walking around his home for exercise. Denies any exertional chest pain or dyspnea on exertion. No recent orthopnea or PND. He has noticed mild edema along his right leg from his vein graft harvest site but this has improved. Sternal pain now resolved.   Past Medical History:  Diagnosis Date   Borderline diabetic     DIET CONTROLLED   CAD (coronary artery disease)    a. s/p NSTEMI in 05/2021 and cath showing multivessel CAD --> s/p CABG on 06/09/2021 with LIMA-LAD, SVG-PDA, seq SVG-RI1-RI2-OM1.   History of kidney stones    Hyperlipemia    Paratesticular mass    PONV (postoperative nausea and vomiting)    Postoperative atrial fibrillation ([place])    Stroke ([place])    Right Eye / VISUAL PROBLEM RESOLVED AFTER ENDARTERECTOMY    Past Surgical History:  Procedure Laterality Date   APPENDECTOMY  07/1972   [place]   CAROTID ENDARTERECTOMY  10/03/10   [facility]   CATARACT EXTRACTION W/PHACO  01/26/2011   Procedure: CATARACT EXTRACTION PHACO AND INTRAOCULAR LENS PLACEMENT ([place]);  Surgeon: Williams Che;  Location: [facility];  Service: Ophthalmology;  Laterality: Right;  CDE:8.64   CATARACT EXTRACTION W/PHACO Left 02/12/2014   Procedure: CATARACT EXTRACTION PHACO AND INTRAOCULAR LENS PLACEMENT (IOC);  Surgeon: Williams Che, MD;  Location: [facility];  Service: Ophthalmology;  Laterality: Left;  CDE 6.25   CORONARY ARTERY BYPASS GRAFT N/A 06/09/2021   Procedure: CORONARY ARTERY BYPASS GRAFTING (CABG), ON PUMP, TIMES FIVE, USING LEFT INTERNAL MAMMARY ARTERY AND RIGHT ENDOSCOPICALLY HARVESTED GREATER SAPHENOUS VEIN;  Surgeon: [name], MD;  Location: [place];  Service: Open Heart Surgery;  Laterality: N/A;   [place]   MD   EYE SURGERY  06/30/2010   detached retina, [facility]sbo   EYE SURGERY Left Nov 26, 2014   Detached Retina  HYDROCELE EXCISION Left 09/30/2015   Procedure: HYDROCELECTOMY ADULT;  Surgeon: Cleon Gustin, MD;  Location: [facility];  Service: Urology;  Laterality: Left;   IRRIGATION AND DEBRIDEMENT SEBACEOUS CYST  2001, 2007   [place], [place], in MD office   LEFT HEART CATH AND CORONARY ANGIOGRAPHY N/A 06/06/2021   Procedure: LEFT HEART CATH AND CORONARY ANGIOGRAPHY;  Surgeon: Sherren Mocha, MD;  Location: [place] [facility];  Service: Cardiovascular;  Laterality: N/A;   SKIN CANCER  EXCISION  2010   pre cancerous tissue:nose, scalp, DR. Hall's office   TEE WITHOUT CARDIOVERSION N/A 06/09/2021   Procedure: TRANSESOPHAGEAL ECHOCARDIOGRAM (TEE);  Surgeon: [name], MD;  Location: [place];  Service: Open Heart Surgery;  Laterality: N/A;   TESTICULAR EXPLORATION Left 09/30/2015   Procedure: INGUINAL PARATESTICULAR  MASS REMOVAL;  Surgeon: Cleon Gustin, MD;  Location: [facility];  Service: Urology;  Laterality: Left;   YAG LASER APPLICATION Right 01/10/2457   Procedure: YAG LASER APPLICATION;  Surgeon: Williams Che, MD;  Location: [facility];  Service: Ophthalmology;  Laterality: Right;    Current Medications: Outpatient Medications Prior to Visit  Medication Sig Dispense Refill   acetaminophen (TYLENOL) 500 MG tablet Take 2 tablets (1,000 mg total) by mouth every 6 (six) hours as needed. 30 tablet 0   aspirin EC 81 MG tablet Take 81 mg by mouth daily as needed (blood thinner). Swallow whole.     atorvastatin (LIPITOR) 80 MG tablet Take 1 tablet (80 mg total) by mouth daily. 30 tablet 3   brimonidine (ALPHAGAN) 0.2 % ophthalmic solution INSTILL 1 DROP INTO RIGHT EYE TWICE DAILY 5 mL 0   Cholecalciferol (VITAMIN D3 PO) Take 1 tablet by mouth daily.     Chromium 1 MG CAPS Take 1 mg by mouth daily.     latanoprost (XALATAN) 0.005 % ophthalmic solution Place 1 drop into both eyes at bedtime.     MAGNESIUM PO Take 250 mg by mouth once a week.      timolol (BETIMOL) 0.5 % ophthalmic solution Place 1 drop into the right eye daily.     timolol (TIMOPTIC) 0.5 % ophthalmic solution INSTILL 1 DROP INTO RIGHT EYE ONCE DAILY (Patient taking differently: Place 1 drop into the right eye 2 (two) times daily.) 5 mL 0   traMADol (ULTRAM) 50 MG tablet Take 1 tablet (50 mg total) by mouth every 4 (four) hours as needed for moderate pain. 30 tablet 0   VANADIUM PO Take 1 tablet by mouth daily.     amiodarone (PACERONE) 200 MG tablet Take 2 tablets (400 mg total) by mouth 2 (two) times  daily. X 7 days, then 200 mg daily 60 tablet 1   clopidogrel (PLAVIX) 75 MG tablet Take 1 tablet (75 mg total) by mouth daily. 30 tablet 3   colchicine 0.6 MG tablet Take 1 tablet (0.6 mg total) by mouth 2 (two) times daily. (Patient taking differently: Take 0.6 mg by mouth daily.) 120 tablet 3   metoprolol tartrate (LOPRESSOR) 25 MG tablet Take 1 tablet (25 mg total) by mouth 2 (two) times daily. 60 tablet 1   No facility-administered medications prior to visit.     Allergies:   Patient has no known allergies.   Social History   Socioeconomic History   Marital status: Married    Spouse name: Not on file   Number of children: Not on file   Years of education: Not on file   Highest education level: Not on  file  Occupational History   Not on file  Tobacco Use   Smoking status: Never   Smokeless tobacco: Never  Vaping Use   Vaping Use: Never used  Substance and Sexual Activity   Alcohol use: No   Drug use: No   Sexual activity: Not on file  Other Topics Concern   Not on file  Social History Narrative   Not on file   Social Determinants of Health   Financial Resource Strain: Not on file  Food Insecurity: Not on file  Transportation Needs: Not on file  Physical Activity: Not on file  Stress: Not on file  Social Connections: Not on file     Family History:  The patient's family history includes Heart disease in his father; Hypertension in his father.   Review of Systems:    Please see the history of present illness.     All other systems reviewed and are otherwise negative except as noted above.   Physical Exam:    VS:  BP (!) 102/58    Pulse (!) 53    Ht 5\' 8"  (1.727 m)    Wt 137 lb (62.1 kg)    SpO2 99%    BMI 20.83 kg/m    General: Well developed, well nourished,male appearing in no acute distress. Head: Normocephalic, atraumatic. Neck: No carotid bruits. JVD not elevated.  Lungs: Respirations regular and unlabored, without wheezes or rales.  Heart: Regular  rate and rhythm. No S3 or S4.  No murmur, no rubs, or gallops appreciated. Sternal incision appears to be healing well with no erythema or drainage.  Abdomen: Appears non-distended. No obvious abdominal masses. Msk:  Strength and tone appear normal for age. No obvious joint deformities or effusions. Extremities: No clubbing or cyanosis. No pitting edema. Trace ankle edema along right leg. Distal pedal pulses are 2+ bilaterally. Neuro: Alert and oriented X 3. Moves all extremities spontaneously. No focal deficits noted. Psych:  Responds to questions appropriately with a normal affect. Skin: No rashes or lesions noted  Wt Readings from Last 3 Encounters:  07/11/21 137 lb (62.1 kg)  06/15/21 147 lb (66.7 kg)  07/20/17 162 lb 12.8 oz (73.8 kg)     Studies/Labs Reviewed:   EKG:  EKG is ordered today.  The ekg ordered today demonstrates sinus bradycardia, HR 53 with RBBB.   Recent Labs: 06/05/2021: TSH 1.469 06/12/2021: Hemoglobin 9.9; Platelets 183 06/15/2021: BUN 18; Creatinine, Ser 1.10; Magnesium 2.0; Potassium 3.5; Sodium 135   Lipid Panel    Component Value Date/Time   CHOL 150 06/05/2021 0100   TRIG 48 06/05/2021 0100   HDL 49 06/05/2021 0100   CHOLHDL 3.1 06/05/2021 0100   VLDL 10 06/05/2021 0100   LDLCALC 91 06/05/2021 0100    Additional studies/ records that were reviewed today include:   LHC: 06/06/2021 Dist LM lesion is 30% stenosed.   Ost LAD lesion is 70% stenosed.   Prox LAD to Mid LAD lesion is 90% stenosed.   Mid LAD to Dist LAD lesion is 90% stenosed.   Prox Cx to Mid Cx lesion is 100% stenosed.   Ramus lesion is 95% stenosed.   Lat Ramus lesion is 90% stenosed.   Prox RCA lesion is 95% stenosed.   Mid RCA lesion is 60% stenosed.   There is mild left ventricular systolic dysfunction.   LV end diastolic pressure is normal.   The left ventricular ejection fraction is 50-55% by visual estimate.   1.  Mild distal left  main stenosis 2.  Severe multisegment LAD  stenoses with 70 to 75% ostial stenosis, 95% proximal stenosis, and 95% mid vessel stenosis 3.  Severe bifurcation stenosis involving the intermediate branch with 90 to 95% lesions in both sub-branches 4.  Total occlusion of the mid circumflex at a small first OM branch. 5.  Severe proximal stenosis of the RCA and moderate stenosis of the mid RCA and an area of tortuosity 6.  Mild segmental LV dysfunction with inferior wall hypokinesis, LVEF estimated at approximately 50 to 55%.   Recommendations: TCTS consultation for consideration of multivessel CABG.  If the patient is not a candidate for CABG, could consider multivessel PCI as an alternative. Anatomy not favorable for PCI with ostial LAD stenosis, bifurcation disease of the ramus, and large extent of obstructive disease.    Echocardiogram: 06/07/2021 IMPRESSIONS     1. Left ventricular ejection fraction, by estimation, is 55 to 60%. The  left ventricle has normal function. The left ventricle has no regional  wall motion abnormalities. Left ventricular diastolic parameters were  normal.   2. Right ventricular systolic function is normal. The right ventricular  size is normal.   3. The mitral valve is normal in structure. Mild mitral valve  regurgitation.   4. The aortic valve is tricuspid. Aortic valve regurgitation is not  visualized. No aortic stenosis is present.   5. The inferior vena cava is normal in size with greater than 50%  respiratory variability, suggesting right atrial pressure of 3 mmHg.   Limited Echo: 06/10/2021 IMPRESSIONS     1. Limted TTE for wall motion. Contrast study shows normal LVEF 60-65%  with no wall motion abnormalities.   2. Left ventricular ejection fraction, by estimation, is 60 to 65%. The  left ventricle has normal function. The left ventricle has no regional  wall motion abnormalities.   3. Right ventricular systolic function was not well visualized. The right  ventricular size is not well  visualized.   Assessment:    1. Coronary artery disease involving native coronary artery of native heart without angina pectoris   2. S/P CABG x 5   3. 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 Postoperative atrial fibrillation ([facility])   5. Hyperlipidemia LDL goal <70      Plan:   In order of problems listed above:  1. CAD - Recently admitted for an NSTEMI and catheterization showed multivessel CAD. Underwent CABG by Dr. Roxan Hockey on 06/09/2021 with LIMA-LAD, SVG-PDA, seq SVG-RI1-RI2-OM1.  - He has been progressing well since returning home and denies any exertional symptoms. Sternal incision appears to be healing well.  - Continue DAPT with ASA 81mg  daily and Plavix 75mg  daily. Continue Atorvastatin 80mg  daily but will reduce Lopressor dosing given his bradycardia. Was on Lopressor 25mg  BID and suggested lowering the dose to 12.5mg  BID. He wishes to switch to Toprol-XL for ease of administration, therefore will stop Lopressor and transition to Toprol-XL 25mg  daily.   2. Dressler Syndrome - This was diagnosed during his recent admission and he was started on Colchicine and recommended to continue this for at least 3 months. Was not placed on NSAIDs given the need for DAPT. - He denies any recent chest pain and no friction rub is appreciated on examination today. Continue Colchicine 0.6mg  daily (reduced to once daily dosing by CT Surgery due to GI side effects) with anticipated stop date of 09/09/2021.  3. Post-Operative Atrial Fibrillation - He had at least 2 episodes during admission and converted back to  normal sinus rhythm quickly with IV Amiodarone. Currently on Amiodarone 200 mg daily and would anticipate a 33-month course of this and then can discontinue if no recurrence in the interim. Projected stop date of 09/09/2021.  4. HLD - FLP during recent admission showed total cholesterol 150, triglycerides 48, HDL 49 and LDL 91. He was started on Atorvastatin 80 mg daily. Will need repeat FLP and  LFT's next month and is scheduled to see his PCP at the end of 07/2021.   Cardiac Rehabilitation Eligibility Assessment  The patient is ready to start cardiac rehabilitation pending clearance from the cardiac surgeon.     Medication Adjustments/Labs and Tests Ordered: Current medicines are reviewed at length with the patient today.  Concerns regarding medicines are outlined above.  Medication changes, Labs and Tests ordered today are listed in the Patient Instructions below. Patient Instructions  Medication Instructions:  STOP Lopressor  START Toprol XL 25 mg daily   On 09/09/21, STOP Amiodarone and Colchicine   Labwork: None today  Testing/Procedures: None today  Follow-Up: 3 months  Any Other Special Instructions Will Be Listed Below (If Applicable).  If you need a refill on your cardiac medications before your next appointment, please call your pharmacy.    Signed, Erma Heritage, PA-C  07/12/2021 9:25 AM    [facility] [address] [place], [place] [address] Phone: [phone] Fax: [phone]

## 2021-07-12 ENCOUNTER — Encounter: Payer: Self-pay | Admitting: Student

## 2021-07-12 MED ORDER — AMIODARONE HCL 200 MG PO TABS: 200.0000 mg | ORAL_TABLET | Freq: Every day | ORAL | 1 refills | Status: DC

## 2021-07-12 MED ORDER — COLCHICINE 0.6 MG PO TABS: 0.6000 mg | ORAL_TABLET | Freq: Every day | ORAL | Status: DC

## 2021-07-15 ENCOUNTER — Other Ambulatory Visit: Payer: Self-pay

## 2021-07-15 ENCOUNTER — Ambulatory Visit (INDEPENDENT_AMBULATORY_CARE_PROVIDER_SITE_OTHER): Payer: Self-pay | Admitting: Thoracic Surgery (Cardiothoracic Vascular Surgery)

## 2021-07-15 ENCOUNTER — Encounter: Payer: Self-pay | Admitting: Thoracic Surgery (Cardiothoracic Vascular Surgery)

## 2021-07-15 ENCOUNTER — Ambulatory Visit
Admission: RE | Admit: 2021-07-15 | Discharge: 2021-07-15 | Disposition: A | Discharge status: Home / Self Care | Payer: Medicare Other | Source: Ambulatory Visit | Attending: Thoracic Surgery (Cardiothoracic Vascular Surgery) | Admitting: Thoracic Surgery (Cardiothoracic Vascular Surgery)

## 2021-07-15 VITALS — BP 131/76 | HR 53 | Ht 68.0 in | Wt 135.0 lb

## 2021-07-15 DIAGNOSIS — Z951 Presence of aortocoronary bypass graft: Secondary | ICD-10-CM | POA: Diagnosis not present

## 2021-07-15 NOTE — Patient Instructions (Signed)
Stop colchicine

## 2021-07-15 NOTE — Progress Notes (Signed)
[address]       [place] [address]             [phone]     HPI: Adam [name] returns for a scheduled postoperative follow up visit  Adam [name] is a 81 year old man with a history of atherosclerotic cardiovascular disease, hypertension, diet-controlled diabetes, right carotid endarterectomy, and TIA.  He presented with exertional back and chest pain.  He ruled in for a non-ST elevation MI.  Cardiac catheterization revealed severe three-vessel coronary disease.  I did coronary bypass grafting x5 on 06/09/2021.  His postoperative course was complicated by atrial fibrillation and pericarditis.  He went home on amiodarone.  His pericarditis was treated with colchicine.  He saw Adam [name] in the office a few weeks back.  He was having issues with diarrhea.  His colchicine dose was cut in half.  That has improved but has not completely resolved.  He is not taking any narcotics for pain.  His exercise tolerance is improving.  He is anxious to start cardiac rehab.  Past Medical History:  Diagnosis Date   Borderline diabetic    DIET CONTROLLED   CAD (coronary artery disease)    a. s/p NSTEMI in 05/2021 and cath showing multivessel CAD --> s/p CABG on 06/09/2021 with LIMA-LAD, SVG-PDA, seq SVG-RI1-RI2-OM1.   History of kidney stones    Hyperlipemia    Paratesticular mass    PONV (postoperative nausea and vomiting)    Postoperative atrial fibrillation ([facility])    Stroke ([facility])    Right Eye / VISUAL PROBLEM RESOLVED AFTER ENDARTERECTOMY     Current Outpatient Medications  Medication Sig Dispense Refill   amiodarone (PACERONE) 200 MG tablet Take 1 tablet (200 mg total) by mouth daily. 60 tablet 1   aspirin EC 81 MG tablet Take 81 mg by mouth daily as needed (blood thinner). Swallow whole.     atorvastatin (LIPITOR) 80 MG tablet Take 1 tablet (80 mg total) by mouth daily. 30 tablet 3   brimonidine (ALPHAGAN) 0.2 % ophthalmic solution INSTILL 1 DROP INTO RIGHT EYE TWICE  DAILY 5 mL 0   Cholecalciferol (VITAMIN D3 PO) Take 1 tablet by mouth daily.     Chromium 1 MG CAPS Take 1 mg by mouth daily.     clopidogrel (PLAVIX) 75 MG tablet Take 1 tablet (75 mg total) by mouth daily. 90 tablet 3   colchicine 0.6 MG tablet Take 1 tablet (0.6 mg total) by mouth daily.     latanoprost (XALATAN) 0.005 % ophthalmic solution Place 1 drop into both eyes at bedtime.     MAGNESIUM PO Take 250 mg by mouth once a week.      metoprolol succinate (TOPROL XL) 25 MG 24 hr tablet Take 1 tablet (25 mg total) by mouth daily. 90 tablet 2   timolol (BETIMOL) 0.5 % ophthalmic solution Place 1 drop into the right eye daily.     timolol (TIMOPTIC) 0.5 % ophthalmic solution INSTILL 1 DROP INTO RIGHT EYE ONCE DAILY (Patient taking differently: Place 1 drop into the right eye 2 (two) times daily.) 5 mL 0   VANADIUM PO Take 1 tablet by mouth daily.     No current facility-administered medications for this visit.    Physical Exam BP 131/76 (BP Location: Left Arm, Patient Position: Sitting, Cuff Size: Normal)    Pulse (!) 53    Ht 5\' 8"  (1.727 m)    Wt 135 lb (61.2 kg)  SpO2 97% Comment: RA   BMI 20.46 kg/m  81 year old man in no acute distress Alert and oriented x3 with no focal deficits Lungs clear with equal breath sounds bilaterally Cardiac regular rate and rhythm with normal S1 and S2, no rub Sternum stable, incision clean dry and intact 1+ edema right lower extremity, no calf tenderness, incisions clean and dry  Diagnostic Tests: I personally reviewed his chest x-ray.  Shows normal postoperative changes with no effusions or infiltrates.  Impression: Adam [name] is a 81 year old man with a history of atherosclerotic cardiovascular disease, hypertension, diet-controlled diabetes, right carotid endarterectomy, and TIA.  He presented with exertional back and chest pain.  He ruled in for a non-ST elevation MI.  Cardiac catheterization revealed severe three-vessel coronary  disease.  Three-vessel CAD-  coronary bypass grafting x5 on 06/09/2021.  Currently doing well with no recurrent pain.  Exercise tolerance improving.  He should not lift anything over 10 pounds for another week.  After that he can gradually increase.  He may begin driving.  Appropriate precautions were discussed.  Post pericardiotomy/Dressler syndrome-has been on colchicine for about a month and a half.  Still having some GI issues.  We will stop colchicine altogether.  Postoperative atrial fibrillation-on amiodarone 200 mg daily.  He will follow-up with cardiology  Plan: Stop colchicine Continue amiodarone through end of February Follow-up with cardiology as scheduled I will be happy to see Adam [name] back anytime in the future if I can be of any further assistance with his care  [name], MD [facility] [phone]

## 2021-07-15 NOTE — Progress Notes (Signed)
Ambulatory referral to Outpatient Cardiac Rehab per Dr. Roxan Hockey to [facility].

## 2021-08-07 DIAGNOSIS — E782 Mixed hyperlipidemia: Secondary | ICD-10-CM | POA: Diagnosis not present

## 2021-08-07 DIAGNOSIS — R197 Diarrhea, unspecified: Secondary | ICD-10-CM | POA: Diagnosis not present

## 2021-08-07 DIAGNOSIS — R7301 Impaired fasting glucose: Secondary | ICD-10-CM | POA: Diagnosis not present

## 2021-08-07 DIAGNOSIS — D509 Iron deficiency anemia, unspecified: Secondary | ICD-10-CM | POA: Diagnosis not present

## 2021-08-12 ENCOUNTER — Encounter: Payer: Self-pay | Admitting: Cardiology

## 2021-08-12 ENCOUNTER — Telehealth: Payer: Self-pay | Admitting: Student

## 2021-08-12 DIAGNOSIS — I480 Paroxysmal atrial fibrillation: Secondary | ICD-10-CM | POA: Diagnosis not present

## 2021-08-12 DIAGNOSIS — I214 Non-ST elevation (NSTEMI) myocardial infarction: Secondary | ICD-10-CM | POA: Diagnosis not present

## 2021-08-12 DIAGNOSIS — D509 Iron deficiency anemia, unspecified: Secondary | ICD-10-CM | POA: Diagnosis not present

## 2021-08-12 DIAGNOSIS — R112 Nausea with vomiting, unspecified: Secondary | ICD-10-CM | POA: Diagnosis not present

## 2021-08-12 DIAGNOSIS — R946 Abnormal results of thyroid function studies: Secondary | ICD-10-CM | POA: Diagnosis not present

## 2021-08-12 DIAGNOSIS — Z951 Presence of aortocoronary bypass graft: Secondary | ICD-10-CM | POA: Diagnosis not present

## 2021-08-12 DIAGNOSIS — E118 Type 2 diabetes mellitus with unspecified complications: Secondary | ICD-10-CM | POA: Diagnosis not present

## 2021-08-12 DIAGNOSIS — E782 Mixed hyperlipidemia: Secondary | ICD-10-CM | POA: Diagnosis not present

## 2021-08-20 ENCOUNTER — Encounter (HOSPITAL_COMMUNITY)
Admission: RE | Admit: 2021-08-20 | Discharge: 2021-08-20 | Disposition: A | Discharge status: Home / Self Care | Payer: Medicare Other | Source: Ambulatory Visit | Attending: Cardiovascular Disease | Admitting: Cardiovascular Disease

## 2021-08-20 ENCOUNTER — Encounter (HOSPITAL_COMMUNITY): Payer: Self-pay

## 2021-08-20 ENCOUNTER — Encounter (HOSPITAL_COMMUNITY): Payer: Medicare Other

## 2021-08-20 VITALS — BP 86/50 | Ht 71.0 in | Wt 131.0 lb

## 2021-08-20 DIAGNOSIS — Z951 Presence of aortocoronary bypass graft: Secondary | ICD-10-CM | POA: Insufficient documentation

## 2021-08-20 DIAGNOSIS — I214 Non-ST elevation (NSTEMI) myocardial infarction: Secondary | ICD-10-CM | POA: Insufficient documentation

## 2021-08-20 NOTE — Progress Notes (Signed)
Cardiac Individual Treatment Plan  Patient Details  Name: Adam [name] MRN: [id number] Date of Birth: [birth year] Referring Provider:   Flowsheet Row CARDIAC REHAB PHASE II ORIENTATION from 08/20/2021 in [place]  Referring Provider Dr. Roxan Hockey       Initial Encounter Date:  Flowsheet Row CARDIAC REHAB PHASE II ORIENTATION from 08/20/2021 in [place]  Date 08/20/21       Visit Diagnosis: NSTEMI (non-ST elevated myocardial infarction) ([place])  S/P CABG x 5  Patient's Home Medications on Admission:  Current Outpatient Medications:    aspirin EC 81 MG tablet, Take 81 mg by mouth every evening. Swallow whole., Disp: , Rfl:    atorvastatin (LIPITOR) 80 MG tablet, Take 1 tablet (80 mg total) by mouth daily. (Patient taking differently: Take 80 mg by mouth every evening.), Disp: 30 tablet, Rfl: 3   brimonidine (ALPHAGAN) 0.2 % ophthalmic solution, INSTILL 1 DROP INTO RIGHT EYE TWICE DAILY (Patient taking differently: Place 1 drop into both eyes in the morning and at bedtime. Take at [place]), Disp: 5 mL, Rfl: 0   CHELATED ZINC PO, Take 1 tablet by mouth once a week., Disp: , Rfl:    Cholecalciferol (VITAMIN D3) 50 MCG (2000 UT) TABS, Take 2,000 Units by mouth every evening., Disp: , Rfl:    Chromium 200 MCG CAPS, Take 1 capsule by mouth every evening., Disp: , Rfl:    clopidogrel (PLAVIX) 75 MG tablet, Take 1 tablet (75 mg total) by mouth daily., Disp: 90 tablet, Rfl: 3   Cyanocobalamin (VITAMIN B-12) 2500 MCG SUBL, Place under the tongue every evening., Disp: , Rfl:    ferrous sulfate 325 (65 FE) MG tablet, Take 325 mg by mouth every evening., Disp: , Rfl:    latanoprost (XALATAN) 0.005 % ophthalmic solution, Place 1 drop into both eyes at bedtime., Disp: , Rfl:    MAGNESIUM PO, Take 250 mg by mouth every 14 (fourteen) days., Disp: , Rfl:    melatonin 5 MG TABS, Take 5 mg by mouth at bedtime., Disp: , Rfl:    metoprolol  succinate (TOPROL XL) 25 MG 24 hr tablet, Take 1 tablet (25 mg total) by mouth daily., Disp: 90 tablet, Rfl: 2   timolol (TIMOPTIC) 0.5 % ophthalmic solution, INSTILL 1 DROP INTO RIGHT EYE ONCE DAILY (Patient taking differently: Place 1 drop into both eyes 2 (two) times daily. Take Midnight and Noon daily), Disp: 5 mL, Rfl: 0   VANADIUM PO, Take 1 tablet by mouth 2 (two) times a week., Disp: , Rfl:    VITAMIN A PO, Take 1 tablet by mouth once a week., Disp: , Rfl:    amiodarone (PACERONE) 200 MG tablet, Take 1 tablet (200 mg total) by mouth daily. (Patient not taking: Reported on 08/19/2021), Disp: 60 tablet, Rfl: 1   colchicine 0.6 MG tablet, Take 1 tablet (0.6 mg total) by mouth daily. (Patient not taking: Reported on 08/13/2021), Disp: , Rfl:   Past Medical History: Past Medical History:  Diagnosis Date   Borderline diabetic    DIET CONTROLLED   CAD (coronary artery disease)    a. s/p NSTEMI in 05/2021 and cath showing multivessel CAD --> s/p CABG on 06/09/2021 with LIMA-LAD, SVG-PDA, seq [id number].   History of kidney stones    Hyperlipemia    Paratesticular mass    PONV (postoperative nausea and vomiting)    Postoperative atrial fibrillation (H[facility])    Stroke ([place])    Right Eye / VISUAL  PROBLEM RESOLVED AFTER ENDARTERECTOMY    Tobacco Use: Social History   Tobacco Use  Smoking Status Never  Smokeless Tobacco Never    Labs: Recent Review Flowsheet Data     Labs for ITP Cardiac and Pulmonary Rehab Latest Ref Rng & Units 06/09/2021 06/09/2021 06/09/2021 06/09/2021 06/09/2021   Cholestrol 0 - 200 mg/dL - - - - -   LDLCALC 0 - 99 mg/dL - - - - -   HDL >40 mg/dL - - - - -   Trlycerides <150 mg/dL - - - - -   Hemoglobin A1c 4.8 - 5.6 % - - - - -   PHART 7.350 - 7.450 7.444 - 7.352 7.325(L) 7.332(L)   PCO2ART 32.0 - 48.0 mmHg 39.4 - 41.6 43.6 40.4   HCO3 20.0 - 28.0 mmol/L 27.0 - 23.4 23.0 21.5   TCO2 22 - 32 mmol/L 28 26 25 24 23    ACIDBASEDEF 0.0 - 2.0 mmol/L - - 2.0 3.0(H)  4.0(H)   O2SAT % 100.0 - 99.0 99.0 100.0       Capillary Blood Glucose: Lab Results  Component Value Date   GLUCAP 148 (H) 06/12/2021   GLUCAP 178 (H) 06/12/2021   GLUCAP 179 (H) 06/12/2021   GLUCAP 161 (H) 06/11/2021   GLUCAP 135 (H) 06/11/2021     Exercise Target Goals: Exercise Program Goal: Individual exercise prescription set using results from initial 6 min walk test and THRR while considering  patients activity barriers and safety.   Exercise Prescription Goal: Starting with aerobic activity 30 plus minutes a day, 3 days per week for initial exercise prescription. Provide home exercise prescription and guidelines that participant acknowledges understanding prior to discharge.  Activity Barriers & Risk Stratification:  Activity Barriers & Cardiac Risk Stratification - 08/20/21 1303       Activity Barriers & Cardiac Risk Stratification   Activity Barriers None    Cardiac Risk Stratification High             6 Minute Walk:  6 Minute Walk     Row Name 08/20/21 1429         6 Minute Walk   Phase Initial     Distance 1000 feet     Walk Time 6 minutes     # of Rest Breaks 0     MPH 1.9     METS 2.15     RPE 12     VO2 Peak 7.54     Symptoms No     Resting HR 55 bpm     Resting BP 86/50     Resting Oxygen Saturation  98 %     Exercise Oxygen Saturation  during 6 min walk 98 %     Max Ex. HR 65 bpm     Max Ex. BP 108/50     2 Minute Post BP 98/50              Oxygen Initial Assessment:   Oxygen Re-Evaluation:   Oxygen Discharge (Final Oxygen Re-Evaluation):   Initial Exercise Prescription:  Initial Exercise Prescription - 08/20/21 1400       Date of Initial Exercise RX and Referring Provider   Date 08/20/21    Referring Provider Dr. Roxan Hockey    Expected Discharge Date 11/07/21      NuStep   Level 1    SPM 60    Minutes 17      Arm Ergometer   Level 1    RPM 50  Minutes 22      Prescription Details   Frequency (times  per week) 3    Duration Progress to 30 minutes of continuous aerobic without signs/symptoms of physical distress      Intensity   THRR 40-80% of Max Heartrate 57-114    Ratings of Perceived Exertion 11-13    Perceived Dyspnea 0-4      Resistance Training   Training Prescription Yes    Weight 3    Reps 10-15             Perform Capillary Blood Glucose checks as needed.  Exercise Prescription Changes:   Exercise Comments:   Exercise Goals and Review:   Exercise Goals     Row Name 08/20/21 1432             Exercise Goals   Increase Physical Activity Yes       Intervention Provide advice, education, support and counseling about physical activity/exercise needs.;Develop an individualized exercise prescription for aerobic and resistive training based on initial evaluation findings, risk stratification, comorbidities and participant's personal goals.       Expected Outcomes Short Term: Attend rehab on a regular basis to increase amount of physical activity.;Long Term: Add in home exercise to make exercise part of routine and to increase amount of physical activity.;Long Term: Exercising regularly at least 3-5 days a week.       Increase Strength and Stamina Yes       Intervention Provide advice, education, support and counseling about physical activity/exercise needs.;Develop an individualized exercise prescription for aerobic and resistive training based on initial evaluation findings, risk stratification, comorbidities and participant's personal goals.       Expected Outcomes Short Term: Increase workloads from initial exercise prescription for resistance, speed, and METs.;Short Term: Perform resistance training exercises routinely during rehab and add in resistance training at home;Long Term: Improve cardiorespiratory fitness, muscular endurance and strength as measured by increased METs and functional capacity (6MWT)       Able to understand and use rate of perceived exertion  (RPE) scale Yes       Intervention Provide education and explanation on how to use RPE scale       Expected Outcomes Short Term: Able to use RPE daily in rehab to express subjective intensity level;Long Term:  Able to use RPE to guide intensity level when exercising independently       Knowledge and understanding of Target Heart Rate Range (THRR) Yes       Intervention Provide education and explanation of THRR including how the numbers were predicted and where they are located for reference       Expected Outcomes Long Term: Able to use THRR to govern intensity when exercising independently;Short Term: Able to state/look up THRR;Short Term: Able to use daily as guideline for intensity in rehab       Able to check pulse independently Yes       Intervention Provide education and demonstration on how to check pulse in carotid and radial arteries.;Review the importance of being able to check your own pulse for safety during independent exercise       Expected Outcomes Short Term: Able to explain why pulse checking is important during independent exercise;Long Term: Able to check pulse independently and accurately       Understanding of Exercise Prescription Yes       Intervention Provide education, explanation, and written materials on patient's individual exercise prescription       Expected  Outcomes Long Term: Able to explain home exercise prescription to exercise independently;Short Term: Able to explain program exercise prescription                Exercise Goals Re-Evaluation :    Discharge Exercise Prescription (Final Exercise Prescription Changes):   Nutrition:  Target Goals: Understanding of nutrition guidelines, daily intake of sodium 1500mg , cholesterol 200mg , calories 30% from fat and 7% or less from saturated fats, daily to have 5 or more servings of fruits and vegetables.  Biometrics:  Pre Biometrics - 08/20/21 1433       Pre Biometrics   Height 5\' 11"  (1.803 m)    Weight  59.4 kg    Waist Circumference 31 inches    Hip Circumference 36 inches    Waist to Hip Ratio 0.86 %    BMI (Calculated) 18.27    Triceps Skinfold 11 mm    % Body Fat 18.9 %    Grip Strength 28 kg    Flexibility 0 in    Single Leg Stand 1 seconds              Nutrition Therapy Plan and Nutrition Goals:   Nutrition Assessments:  Nutrition Assessments - 08/20/21 1356       MEDFICTS Scores   Pre Score 19            MEDIFICTS Score Key: ?70 Need to make dietary changes  40-70 Heart Healthy Diet ? 40 Therapeutic Level Cholesterol Diet   Picture Your Plate Scores: <44 Unhealthy dietary pattern with much room for improvement. 41-50 Dietary pattern unlikely to meet recommendations for good health and room for improvement. 51-60 More healthful dietary pattern, with some room for improvement.  >60 Healthy dietary pattern, although there may be some specific behaviors that could be improved.    Nutrition Goals Re-Evaluation:   Nutrition Goals Discharge (Final Nutrition Goals Re-Evaluation):   Psychosocial: Target Goals: Acknowledge presence or absence of significant depression and/or stress, maximize coping skills, provide positive support system. Participant is able to verbalize types and ability to use techniques and skills needed for reducing stress and depression.  Initial Review & Psychosocial Screening:  Initial Psych Review & Screening - 08/20/21 1412       Initial Review   Current issues with None Identified      Family Dynamics   Good Support System? Yes      Barriers   Psychosocial barriers to participate in program There are no identifiable barriers or psychosocial needs.      Screening Interventions   Interventions Provide feedback about the scores to participant    Expected Outcomes Short Term goal: Identification and review with participant of any Quality of Life or Depression concerns found by scoring the questionnaire.              Quality of Life Scores:  Quality of Life - 08/20/21 1433       Quality of Life   Select Quality of Life      Quality of Life Scores   Health/Function Pre 21.85 %    Socioeconomic Pre 25.64 %    Psych/Spiritual Pre 28.93 %    Family Pre 26.4 %    GLOBAL Pre 24.94 %            Scores of 19 and below usually indicate a poorer quality of life in these areas.  A difference of  2-3 points is a clinically meaningful difference.  A difference of 2-3 points in  the total score of the Quality of Life Index has been associated with significant improvement in overall quality of life, self-image, physical symptoms, and general health in studies assessing change in quality of life.  PHQ-9: Recent Review Flowsheet Data     Depression screen [facility] 2/9 08/20/2021   Decreased Interest 0   Down, Depressed, Hopeless 0   PHQ - 2 Score 0   Altered sleeping 0   Tired, decreased energy 2   Change in appetite 3   Feeling bad or failure about yourself  0   Trouble concentrating 0   Moving slowly or fidgety/restless 0   Suicidal thoughts 0   PHQ-9 Score 5   Difficult doing work/chores Somewhat difficult      Interpretation of Total Score  Total Score Depression Severity:  1-4 = Minimal depression, 5-9 = Mild depression, 10-14 = Moderate depression, 15-19 = Moderately severe depression, 20-27 = Severe depression   Psychosocial Evaluation and Intervention:  Psychosocial Evaluation - 08/20/21 1416       Psychosocial Evaluation & Interventions   Interventions Stress management education;Relaxation education;Encouraged to exercise with the program and follow exercise prescription    Comments Patient has no psychosocial barriers or issues identified at his orientation visit. His initial PHQ-9 score was 5 based on a poor appetite since his surgery and decreased energy. He denies any depression, anxiety or unmanagable stress. He is the primary caregiver of his wife who is currently being treated for  breast cancer and has some mobility limitations and he does not like to leave her alone for long periods. He has 3 children. He names his wife, his children, and his chruch family as his support. He is very active in his community. 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 He is ready to start the program hoping to gain strength and stamina.    Expected Outcomes Patient will continue to have no psychosocial barriers identified.             Psychosocial Re-Evaluation:   Psychosocial Discharge (Final Psychosocial Re-Evaluation):   Vocational Rehabilitation: Provide vocational rehab assistance to qualifying candidates.   Vocational Rehab Evaluation & Intervention:  Vocational Rehab - 08/20/21 1357       Initial Vocational Rehab Evaluation & Intervention   Assessment shows need for Vocational Rehabilitation No      Vocational Rehab Re-Evaulation   Comments Patient is retired and does not need vocational rehab.             Education: Education Goals: Education classes will be provided on a weekly basis, covering required topics. Participant will state understanding/return demonstration of topics presented.  Learning Barriers/Preferences:  Learning Barriers/Preferences - 08/20/21 1356       Learning Barriers/Preferences   Learning Barriers None    Learning Preferences Written Material             Education Topics: Hypertension, Hypertension Reduction -Define heart disease and high blood pressure. Discus how high blood pressure affects the body and ways to reduce high blood pressure.   Exercise and Your Heart -Discuss why it is important to exercise, the FITT principles of exercise, normal and abnormal responses to exercise, and how to exercise safely.   Angina -Discuss definition of angina, causes of angina, treatment of angina, and how to decrease risk of having angina.   Cardiac Medications -Review what  the following cardiac medications are used for, how they affect the body, and  side effects that may occur when taking the medications.  Medications include Aspirin, Beta blockers, calcium channel blockers, ACE Inhibitors, angiotensin receptor blockers, diuretics, digoxin, and antihyperlipidemics.   Congestive Heart Failure -Discuss the definition of CHF, how to live with CHF, the signs and symptoms of CHF, and how keep track of weight and sodium intake.   Heart Disease and Intimacy -Discus the effect sexual activity has on the heart, how changes occur during intimacy as we age, and safety during sexual activity.   Smoking Cessation / COPD -Discuss different methods to quit smoking, the health benefits of quitting smoking, and the definition of COPD.   Nutrition I: Fats -Discuss the types of cholesterol, what cholesterol does to the heart, and how cholesterol levels can be controlled.   Nutrition II: Labels -Discuss the different components of food labels and how to read food label   Heart Parts/Heart Disease and PAD -Discuss the anatomy of the heart, the pathway of blood circulation through the heart, and these are affected by heart disease.   Stress I: Signs and Symptoms -Discuss the causes of stress, how stress may lead to anxiety and depression, and ways to limit stress.   Stress II: Relaxation -Discuss different types of relaxation techniques to limit stress.   Warning Signs of Stroke / TIA -Discuss definition of a stroke, what the signs and symptoms are of a stroke, and how to identify when someone is having stroke.   Knowledge Questionnaire Score:  Knowledge Questionnaire Score - 08/20/21 1357       Knowledge Questionnaire Score   Pre Score 20/24             Core Components/Risk Factors/Patient Goals at Admission:  Personal Goals and Risk Factors at Admission - 08/20/21 1413       Core Components/Risk Factors/Patient Goals on Admission    Weight  Management Weight Maintenance    Lipids Yes    Intervention Provide education and support for participant on nutrition & aerobic/resistive exercise along with prescribed medications to achieve LDL 70mg , HDL >40mg .    Expected Outcomes Short Term: Participant states understanding of desired cholesterol values and is compliant with medications prescribed. Participant is following exercise prescription and nutrition guidelines.;Long Term: Cholesterol controlled with medications as prescribed, with individualized exercise RX and with personalized nutrition plan. Value goals: LDL < 70mg , HDL > 40 mg.    Personal Goal Other Yes    Personal Goal Patient wants to improve his strength and stamina and mobility. He wants to be able to continue and care for his wife and do his home management task.    Intervention Patient will attend CR 3 days/week with exercise and education and supplement with exercise at home.    Expected Outcomes Patient will meet both program and personal goals.             Core Components/Risk Factors/Patient Goals Review:    Core Components/Risk Factors/Patient Goals at Discharge (Final Review):    ITP Comments:   Comments: Patient arrived for 1st visit/orientation/education at 1230. Patient was referred to CR by Dr. Roxan Hockey due to NSTEMI (I21.4) and CABGx5 (Z95.1). During orientation advised patient on arrival and appointment times what to wear, what to do before, during and after exercise. Reviewed attendance and class policy.  Pt is scheduled to return Cardiac Rehab on 08/25/21 at 2:45. Pt was advised to come to class 15 minutes before class starts.  Discussed RPE/Dpysnea scales. Patient participated in warm up stretches. Patient was able to complete  6 minute walk test.  Telemetry:NSR RBBB. Patient was measured for the equipment. Discussed equipment safety with patient. Took patient pre-anthropometric measurements. Patient finished visit at 1415.

## 2021-08-22 ENCOUNTER — Encounter (HOSPITAL_COMMUNITY): Payer: Medicare Other

## 2021-08-25 ENCOUNTER — Encounter (HOSPITAL_COMMUNITY)
Admission: RE | Admit: 2021-08-25 | Discharge: 2021-08-25 | Disposition: A | Discharge status: Home / Self Care | Payer: Medicare Other | Source: Ambulatory Visit | Attending: Cardiovascular Disease | Admitting: Cardiovascular Disease

## 2021-08-25 VITALS — Wt 132.9 lb

## 2021-08-25 DIAGNOSIS — Z951 Presence of aortocoronary bypass graft: Secondary | ICD-10-CM

## 2021-08-25 DIAGNOSIS — I214 Non-ST elevation (NSTEMI) myocardial infarction: Secondary | ICD-10-CM

## 2021-08-25 LAB — GLUCOSE, CAPILLARY: Glucose-Capillary: 103 mg/dL — ABNORMAL HIGH (ref 70–99)

## 2021-08-25 NOTE — Progress Notes (Signed)
Daily Session Note  Patient Details  Name: Adam [name] MRN: [id number] Date of Birth: Sep 16, 1940 Referring Provider:   Flowsheet Row CARDIAC REHAB PHASE II ORIENTATION from 08/20/2021 in [place]  Referring Provider Dr. Roxan Hockey       Encounter Date: 08/25/2021  Check In:  Session Check In - 08/25/21 1430       Check-In   Supervising physician immediately available to respond to emergencies [facility] MD immediately available    Physician(s) Branch    Location [facility]    Staff Present Aundra Dubin, RN, Madlyn Frankel, RN, Bjorn Loser, MS, ACSM-CEP, Exercise Physiologist;Heather Zigmund Angelita Harnack, Exercise Physiologist    Virtual Visit No    Medication changes reported     No    Fall or balance concerns reported    Yes    Comments History of 2 falls. Patient complains of feeling dizzy when he gets up too fast.    Tobacco Cessation No Change    Warm-up and Cool-down Performed as group-led instruction    Resistance Training Performed Yes    VAD Patient? No      Pain Assessment   Currently in Pain? No/denies    Multiple Pain Sites No             Capillary Blood Glucose: Results for orders placed or performed during the hospital encounter of 08/25/21 (from the past 24 hour(s))  Glucose, capillary     Status: Abnormal   Collection Time: 08/25/21  2:36 PM  Result Value Ref Range   Glucose-Capillary 103 (H) 70 - 99 mg/dL      Social History   Tobacco Use  Smoking Status Never  Smokeless Tobacco Never    Goals Met:  Independence with exercise equipment Exercise tolerated well No report of concerns or symptoms today  Goals Unmet:  Not Applicable  Comments: checkout 1545    Dr. Carlyle Dolly is Medical Director for [place]

## 2021-08-27 ENCOUNTER — Encounter (HOSPITAL_COMMUNITY)
Admission: RE | Admit: 2021-08-27 | Discharge: 2021-08-27 | Disposition: A | Discharge status: Home / Self Care | Payer: Medicare Other | Source: Ambulatory Visit | Attending: Cardiovascular Disease | Admitting: Cardiovascular Disease

## 2021-08-27 ENCOUNTER — Other Ambulatory Visit: Payer: Self-pay

## 2021-08-27 DIAGNOSIS — I214 Non-ST elevation (NSTEMI) myocardial infarction: Secondary | ICD-10-CM

## 2021-08-27 DIAGNOSIS — Z951 Presence of aortocoronary bypass graft: Secondary | ICD-10-CM

## 2021-08-27 NOTE — Progress Notes (Signed)
Daily Session Note  Patient Details  Name: COLLIER BOHNET MRN: [id number] Date of Birth: 05-Dec-1940 Referring Provider:   Flowsheet Row CARDIAC REHAB PHASE II ORIENTATION from 08/20/2021 in [place]  Referring Provider Dr. Roxan Hockey       Encounter Date: 08/27/2021  Check In:  Session Check In - 08/27/21 1445       Check-In   Supervising physician immediately available to respond to emergencies [facility] MD immediately available    Physician(s) Dr. Harl Bowie    Location [facility]    Staff Present Geanie Cooley, RN;Heather Otho Ket, BS, Exercise Physiologist;Debra Wynetta Emery, RN, BSN    Virtual Visit No    Medication changes reported     No    Fall or balance concerns reported    Yes    Comments History of 2 falls. Patient complains of feeling dizzy when he gets up too fast.    Tobacco Cessation No Change    Warm-up and Cool-down Performed as group-led instruction    Resistance Training Performed Yes    VAD Patient? No    PAD/SET Patient? No      Pain Assessment   Currently in Pain? No/denies    Multiple Pain Sites No             Capillary Blood Glucose: No results found for this or any previous visit (from the past 24 hour(s)).    Social History   Tobacco Use  Smoking Status Never  Smokeless Tobacco Never    Goals Met:  Independence with exercise equipment Exercise tolerated well No report of concerns or symptoms today Strength training completed today  Goals Unmet:  Not Applicable  Comments: check out @ 3:45pm   Dr. Carlyle Dolly is Medical Director for [place]

## 2021-08-29 ENCOUNTER — Encounter (HOSPITAL_COMMUNITY)
Admission: RE | Admit: 2021-08-29 | Discharge: 2021-08-29 | Disposition: A | Discharge status: Home / Self Care | Payer: Medicare Other | Source: Ambulatory Visit | Attending: Cardiovascular Disease | Admitting: Cardiovascular Disease

## 2021-08-29 DIAGNOSIS — I214 Non-ST elevation (NSTEMI) myocardial infarction: Secondary | ICD-10-CM | POA: Diagnosis not present

## 2021-08-29 DIAGNOSIS — Z951 Presence of aortocoronary bypass graft: Secondary | ICD-10-CM | POA: Diagnosis not present

## 2021-08-29 NOTE — Progress Notes (Signed)
Daily Session Note  Patient Details  Name: Adam [name] MRN: [id number] Date of Birth: 1941-01-01 Referring Provider:   Flowsheet Row CARDIAC REHAB PHASE II ORIENTATION from 08/20/2021 in [place]  Referring Provider Dr. Roxan Hockey       Encounter Date: 08/29/2021  Check In:  Session Check In - 08/29/21 1445       Check-In   Supervising physician immediately available to respond to emergencies [facility] MD immediately available    Physician(s) Dr. Harrington Challenger    Location [facility]    Staff Present Geanie Cooley, RN;Dalton Kris Mouton, MS, ACSM-CEP, Exercise Physiologist;Heather Zigmund Daniel, Exercise Physiologist    Virtual Visit No    Medication changes reported     No    Fall or balance concerns reported    Yes    Comments History of 2 falls. Patient complains of feeling dizzy when he gets up too fast.    Tobacco Cessation No Change    Warm-up and Cool-down Performed as group-led instruction    Resistance Training Performed Yes    VAD Patient? No    PAD/SET Patient? No      Pain Assessment   Currently in Pain? No/denies    Multiple Pain Sites No             Capillary Blood Glucose: No results found for this or any previous visit (from the past 24 hour(s)).    Social History   Tobacco Use  Smoking Status Never  Smokeless Tobacco Never    Goals Met:  Independence with exercise equipment Exercise tolerated well No report of concerns or symptoms today Strength training completed today  Goals Unmet:  Not Applicable  Comments: check out @ 3:45pm   Dr. Carlyle Dolly is Medical Director for [place]

## 2021-09-01 ENCOUNTER — Encounter (HOSPITAL_COMMUNITY)
Admission: RE | Admit: 2021-09-01 | Discharge: 2021-09-01 | Disposition: A | Discharge status: Home / Self Care | Payer: Medicare Other | Source: Ambulatory Visit | Attending: Cardiovascular Disease | Admitting: Cardiovascular Disease

## 2021-09-01 DIAGNOSIS — Z951 Presence of aortocoronary bypass graft: Secondary | ICD-10-CM

## 2021-09-01 DIAGNOSIS — I214 Non-ST elevation (NSTEMI) myocardial infarction: Secondary | ICD-10-CM

## 2021-09-01 NOTE — Progress Notes (Signed)
Daily Session Note  Patient Details  Name: Adam [name] MRN: [id number] Date of Birth: 01/08/1941 Referring Provider:   Flowsheet Row CARDIAC REHAB PHASE II ORIENTATION from 08/20/2021 in [place]  Referring Provider Dr. Roxan Hockey       Encounter Date: 09/01/2021  Check In:  Session Check In - 09/01/21 1445       Check-In   Supervising physician immediately available to respond to emergencies [facility] MD immediately available    Physician(s) Dr. Harl Bowie    Location [facility]    Staff Present Geanie Cooley, RN;Dalton Kris Mouton, MS, ACSM-CEP, Exercise Physiologist;Heather Zigmund Daniel, Exercise Physiologist    Virtual Visit No    Medication changes reported     No    Fall or balance concerns reported    Yes    Comments History of 2 falls. Patient complains of feeling dizzy when he gets up too fast.    Tobacco Cessation No Change    Warm-up and Cool-down Performed as group-led instruction    Resistance Training Performed Yes    VAD Patient? No    PAD/SET Patient? No      Pain Assessment   Currently in Pain? No/denies    Multiple Pain Sites No             Capillary Blood Glucose: No results found for this or any previous visit (from the past 24 hour(s)).    Social History   Tobacco Use  Smoking Status Never  Smokeless Tobacco Never    Goals Met:  Independence with exercise equipment Exercise tolerated well No report of concerns or symptoms today Strength training completed today  Goals Unmet:  Not Applicable  Comments: checkout @ 3:45pm   Dr. Carlyle Dolly is Medical Director for [facility]

## 2021-09-03 ENCOUNTER — Encounter (HOSPITAL_COMMUNITY)
Admission: RE | Admit: 2021-09-03 | Discharge: 2021-09-03 | Disposition: A | Discharge status: Home / Self Care | Payer: Medicare Other | Source: Ambulatory Visit | Attending: Cardiovascular Disease | Admitting: Cardiovascular Disease

## 2021-09-03 DIAGNOSIS — I214 Non-ST elevation (NSTEMI) myocardial infarction: Secondary | ICD-10-CM

## 2021-09-03 DIAGNOSIS — Z951 Presence of aortocoronary bypass graft: Secondary | ICD-10-CM | POA: Diagnosis not present

## 2021-09-03 NOTE — Progress Notes (Signed)
Daily Session Note  Patient Details  Name: Adam [name] MRN: [id number] Date of Birth: 03/05/1941 Referring Provider:   Flowsheet Row CARDIAC REHAB PHASE II ORIENTATION from 08/20/2021 in [place]  Referring Provider Dr. Roxan Hockey       Encounter Date: 09/03/2021  Check In:  Session Check In - 09/03/21 1445       Check-In   Physician(s) Dr. Domenic Polite    Location [facility]    Staff Present Geanie Cooley, RN;Heather Otho Ket, BS, Exercise Physiologist    Virtual Visit No    Medication changes reported     No    Fall or balance concerns reported    Yes    Comments History of 2 falls. Patient complains of feeling dizzy when he gets up too fast.    Tobacco Cessation No Change    Warm-up and Cool-down Performed as group-led instruction    Resistance Training Performed Yes    VAD Patient? No    PAD/SET Patient? No      Pain Assessment   Currently in Pain? No/denies    Multiple Pain Sites No             Capillary Blood Glucose: No results found for this or any previous visit (from the past 24 hour(s)).    Social History   Tobacco Use  Smoking Status Never  Smokeless Tobacco Never    Goals Met:  Independence with exercise equipment Exercise tolerated well No report of concerns or symptoms today Strength training completed today  Goals Unmet:  Not Applicable  Comments: check out @ 3:45pm   Dr. Carlyle Dolly is Medical Director for [facility]

## 2021-09-05 ENCOUNTER — Encounter (HOSPITAL_COMMUNITY)
Admission: RE | Admit: 2021-09-05 | Discharge: 2021-09-05 | Disposition: A | Discharge status: Home / Self Care | Payer: Medicare Other | Source: Ambulatory Visit | Attending: Cardiovascular Disease | Admitting: Cardiovascular Disease

## 2021-09-05 DIAGNOSIS — I214 Non-ST elevation (NSTEMI) myocardial infarction: Secondary | ICD-10-CM

## 2021-09-05 DIAGNOSIS — Z951 Presence of aortocoronary bypass graft: Secondary | ICD-10-CM

## 2021-09-05 NOTE — Progress Notes (Signed)
Daily Session Note  Patient Details  Name: JAQUESE IRVING MRN: [id number] Date of Birth: 1940-12-20 Referring Provider:   Flowsheet Row CARDIAC REHAB PHASE II ORIENTATION from 08/20/2021 in [facility]  Referring Provider Dr. Roxan Hockey       Encounter Date: 09/05/2021  Check In:  Session Check In - 09/05/21 1445       Check-In   Supervising physician immediately available to respond to emergencies [facility] MD immediately available    Physician(s) Dr. Domenic Polite    Location [facility]    Staff Present Redge Gainer, BS, Exercise Physiologist;Other    Virtual Visit No    Medication changes reported     No    Fall or balance concerns reported    Yes    Comments History of 2 falls. Patient complains of feeling dizzy when he gets up too fast.    Tobacco Cessation No Change    Warm-up and Cool-down Performed as group-led instruction    Resistance Training Performed Yes    VAD Patient? No    PAD/SET Patient? No      Pain Assessment   Currently in Pain? No/denies    Multiple Pain Sites No             Capillary Blood Glucose: No results found for this or any previous visit (from the past 24 hour(s)).    Social History   Tobacco Use  Smoking Status Never  Smokeless Tobacco Never    Goals Met:  Independence with exercise equipment Exercise tolerated well No report of concerns or symptoms today Strength training completed today  Goals Unmet:  Not Applicable  Comments: check out 1545   Dr. Carlyle Dolly is Medical Director for [facility]

## 2021-09-08 ENCOUNTER — Encounter (HOSPITAL_COMMUNITY)
Admission: RE | Admit: 2021-09-08 | Discharge: 2021-09-08 | Disposition: A | Discharge status: Home / Self Care | Payer: Medicare Other | Source: Ambulatory Visit | Attending: Cardiovascular Disease | Admitting: Cardiovascular Disease

## 2021-09-08 DIAGNOSIS — Z951 Presence of aortocoronary bypass graft: Secondary | ICD-10-CM

## 2021-09-08 DIAGNOSIS — I214 Non-ST elevation (NSTEMI) myocardial infarction: Secondary | ICD-10-CM

## 2021-09-08 NOTE — Progress Notes (Signed)
Daily Session Note  Patient Details  Name: Adam [name] MRN: [id number] Date of Birth: 1940/12/03 Referring Provider:   Flowsheet Row CARDIAC REHAB PHASE II ORIENTATION from 08/20/2021 in [place]  Referring Provider Dr. Roxan Hockey       Encounter Date: 09/08/2021  Check In:  Session Check In - 09/08/21 1445       Check-In   Supervising physician immediately available to respond to emergencies [facility] MD immediately available    Physician(s) Dr Harl Bowie    Location [facility]    Staff Present Jilda Roche, RN, Bjorn Loser, MS, ACSM-CEP, Exercise Physiologist;Heather Zigmund Bradly Sangiovanni, Exercise Physiologist    Virtual Visit No    Medication changes reported     No    Fall or balance concerns reported    Yes    Comments History of 2 falls. Patient complains of feeling dizzy when he gets up too fast.    Tobacco Cessation No Change    Warm-up and Cool-down Performed as group-led instruction    Resistance Training Performed Yes    VAD Patient? No    PAD/SET Patient? No      Pain Assessment   Currently in Pain? No/denies             Capillary Blood Glucose: No results found for this or any previous visit (from the past 24 hour(s)).    Social History   Tobacco Use  Smoking Status Never  Smokeless Tobacco Never    Goals Met:  Independence with exercise equipment Exercise tolerated well No report of concerns or symptoms today Strength training completed today  Goals Unmet:  Not Applicable  Comments: checkout 1545   Dr. Carlyle Dolly is Medical Director for [facility]

## 2021-09-10 ENCOUNTER — Other Ambulatory Visit: Payer: Self-pay | Admitting: Physician Assistant

## 2021-09-10 ENCOUNTER — Encounter (HOSPITAL_COMMUNITY)
Admission: RE | Admit: 2021-09-10 | Discharge: 2021-09-10 | Disposition: A | Discharge status: Home / Self Care | Payer: Medicare Other | Source: Ambulatory Visit | Attending: Cardiovascular Disease | Admitting: Cardiovascular Disease

## 2021-09-10 DIAGNOSIS — I214 Non-ST elevation (NSTEMI) myocardial infarction: Secondary | ICD-10-CM | POA: Diagnosis not present

## 2021-09-10 DIAGNOSIS — Z951 Presence of aortocoronary bypass graft: Secondary | ICD-10-CM | POA: Insufficient documentation

## 2021-09-10 NOTE — Progress Notes (Signed)
Daily Session Note ? ?Patient Details  ?Name: Adam [name] ?MRN: [id number] ?Date of Birth: 11/20/1940 ?Referring Provider:   ?Flowsheet Row CARDIAC REHAB PHASE II ORIENTATION from 08/20/2021 in [place]  ?Referring Provider Dr. Roxan Hockey  ? ?  ? ? ?Encounter Date: 09/10/2021 ? ?Check In: ? Session Check In - 09/10/21 1445   ? ?  ? Check-In  ? Supervising physician immediately available to respond to emergencies [facility] MD immediately available   ? Physician(s) Dr Harl Bowie   ? Location [facility]   ? Staff Present Geanie Cooley, RN;Dalton Kris Mouton, MS, ACSM-CEP, Exercise Physiologist;Heather Otho Ket, BS, Exercise Physiologist;Debra Wynetta Emery, RN, BSN   ? Virtual Visit No   ? Medication changes reported     No   ? Fall or balance concerns reported    Yes   ? Comments History of 2 falls. Patient complains of feeling dizzy when he gets up too fast.   ? Tobacco Cessation No Change   ? Warm-up and Cool-down Performed as group-led instruction   ? Resistance Training Performed Yes   ? VAD Patient? No   ? PAD/SET Patient? No   ?  ? Pain Assessment  ? Currently in Pain? No/denies   ? Multiple Pain Sites No   ? ?  ?  ? ?  ? ? ?Capillary Blood Glucose: ?No results found for this or any previous visit (from the past 24 hour(s)). ? ? ? ?Social History  ? ?Tobacco Use  ?Smoking Status Never  ?Smokeless Tobacco Never  ? ? ?Goals Met:  ?Independence with exercise equipment ?Exercise tolerated well ?No report of concerns or symptoms today ?Strength training completed today ? ?Goals Unmet:  ?Not Applicable ? ?Comments: check out @ 3:45pm ? ? ?Dr. Carlyle Dolly is Medical Director for [place] ?

## 2021-09-10 NOTE — Progress Notes (Signed)
Cardiac Individual Treatment Plan  Patient Details  Name: Adam [name] MRN: [id number] Date of Birth: 1941-01-31 Referring Provider:   Flowsheet Row CARDIAC REHAB PHASE II ORIENTATION from 08/20/2021 in [facility]  Referring Provider Dr. Roxan Hockey       Initial Encounter Date:  Flowsheet Row CARDIAC REHAB PHASE II ORIENTATION from 08/20/2021 in [facility]  Date 08/20/21       Visit Diagnosis: S/P CABG x 5  NSTEMI (non-ST elevated myocardial infarction) ([place])  Patient's Home Medications on Admission:  Current Outpatient Medications:    amiodarone (PACERONE) 200 MG tablet, Take 1 tablet (200 mg total) by mouth daily. (Patient not taking: Reported on 08/19/2021), Disp: 60 tablet, Rfl: 1   aspirin EC 81 MG tablet, Take 81 mg by mouth every evening. Swallow whole., Disp: , Rfl:    atorvastatin (LIPITOR) 80 MG tablet, Take 1 tablet (80 mg total) by mouth daily. (Patient taking differently: Take 80 mg by mouth every evening.), Disp: 30 tablet, Rfl: 3   brimonidine (ALPHAGAN) 0.2 % ophthalmic solution, INSTILL 1 DROP INTO RIGHT EYE TWICE DAILY (Patient taking differently: Place 1 drop into both eyes in the morning and at bedtime. Take at [place]), Disp: 5 mL, Rfl: 0   CHELATED ZINC PO, Take 1 tablet by mouth once a week., Disp: , Rfl:    Cholecalciferol (VITAMIN D3) 50 MCG (2000 UT) TABS, Take 2,000 Units by mouth every evening., Disp: , Rfl:    Chromium 200 MCG CAPS, Take 1 capsule by mouth every evening., Disp: , Rfl:    clopidogrel (PLAVIX) 75 MG tablet, Take 1 tablet (75 mg total) by mouth daily., Disp: 90 tablet, Rfl: 3   colchicine 0.6 MG tablet, Take 1 tablet (0.6 mg total) by mouth daily. (Patient not taking: Reported on 08/13/2021), Disp: , Rfl:    Cyanocobalamin (VITAMIN B-12) 2500 MCG SUBL, Place under the tongue every evening., Disp: , Rfl:    ferrous sulfate 325 (65 FE) MG tablet, Take 325 mg by mouth every evening., Disp: ,  Rfl:    latanoprost (XALATAN) 0.005 % ophthalmic solution, Place 1 drop into both eyes at bedtime., Disp: , Rfl:    MAGNESIUM PO, Take 250 mg by mouth every 14 (fourteen) days., Disp: , Rfl:    melatonin 5 MG TABS, Take 5 mg by mouth at bedtime., Disp: , Rfl:    metoprolol succinate (TOPROL XL) 25 MG 24 hr tablet, Take 1 tablet (25 mg total) by mouth daily., Disp: 90 tablet, Rfl: 2   timolol (TIMOPTIC) 0.5 % ophthalmic solution, INSTILL 1 DROP INTO RIGHT EYE ONCE DAILY (Patient taking differently: Place 1 drop into both eyes 2 (two) times daily. Take Midnight and Noon daily), Disp: 5 mL, Rfl: 0   VANADIUM PO, Take 1 tablet by mouth 2 (two) times a week., Disp: , Rfl:    VITAMIN A PO, Take 1 tablet by mouth once a week., Disp: , Rfl:   Past Medical History: Past Medical History:  Diagnosis Date   Borderline diabetic    DIET CONTROLLED   CAD (coronary artery disease)    a. s/p NSTEMI in 05/2021 and cath showing multivessel CAD --> s/p CABG on 06/09/2021 with LIMA-LAD, SVG-PDA, seq [id number].   History of kidney stones    Hyperlipemia    Paratesticular mass    PONV (postoperative nausea and vomiting)    Postoperative atrial fibrillation (HCC)    Stroke ([place])    Right Eye / VISUAL  PROBLEM RESOLVED AFTER ENDARTERECTOMY    Tobacco Use: Social History   Tobacco Use  Smoking Status Never  Smokeless Tobacco Never    Labs: Recent Review Flowsheet Data     Labs for ITP Cardiac and Pulmonary Rehab Latest Ref Rng & Units 06/09/2021 06/09/2021 06/09/2021 06/09/2021 06/09/2021   Cholestrol 0 - 200 mg/dL - - - - -   LDLCALC 0 - 99 mg/dL - - - - -   HDL >40 mg/dL - - - - -   Trlycerides <150 mg/dL - - - - -   Hemoglobin A1c 4.8 - 5.6 % - - - - -   PHART 7.350 - 7.450 7.444 - 7.352 7.325(L) 7.332(L)   PCO2ART 32.0 - 48.0 mmHg 39.4 - 41.6 43.6 40.4   HCO3 20.0 - 28.0 mmol/L 27.0 - 23.4 23.0 21.5   TCO2 22 - 32 mmol/L 28 26 25 24 23    ACIDBASEDEF 0.0 - 2.0 mmol/L - - 2.0 3.0(H) 4.0(H)    O2SAT % 100.0 - 99.0 99.0 100.0       Capillary Blood Glucose: Lab Results  Component Value Date   GLUCAP 103 (H) 08/25/2021   GLUCAP 148 (H) 06/12/2021   GLUCAP 178 (H) 06/12/2021   GLUCAP 179 (H) 06/12/2021   GLUCAP 161 (H) 06/11/2021     Exercise Target Goals: Exercise Program Goal: Individual exercise prescription set using results from initial 6 min walk test and THRR while considering  patients activity barriers and safety.   Exercise Prescription Goal: Starting with aerobic activity 30 plus minutes a day, 3 days per week for initial exercise prescription. Provide home exercise prescription and guidelines that participant acknowledges understanding prior to discharge.  Activity Barriers & Risk Stratification:  Activity Barriers & Cardiac Risk Stratification - 08/20/21 1303       Activity Barriers & Cardiac Risk Stratification   Activity Barriers None    Cardiac Risk Stratification High             6 Minute Walk:  6 Minute Walk     Row Name 08/20/21 1429         6 Minute Walk   Phase Initial     Distance 1000 feet     Walk Time 6 minutes     # of Rest Breaks 0     MPH 1.9     METS 2.15     RPE 12     VO2 Peak 7.54     Symptoms No     Resting HR 55 bpm     Resting BP 86/50     Resting Oxygen Saturation  98 %     Exercise Oxygen Saturation  during 6 min walk 98 %     Max Ex. HR 65 bpm     Max Ex. BP 108/50     2 Minute Post BP 98/50              Oxygen Initial Assessment:   Oxygen Re-Evaluation:   Oxygen Discharge (Final Oxygen Re-Evaluation):   Initial Exercise Prescription:  Initial Exercise Prescription - 08/20/21 1400       Date of Initial Exercise RX and Referring Provider   Date 08/20/21    Referring Provider Dr. Roxan Hockey    Expected Discharge Date 11/07/21      NuStep   Level 1    SPM 60    Minutes 17      Arm Ergometer   Level 1    RPM 50  Minutes 22      Prescription Details   Frequency (times per  week) 3    Duration Progress to 30 minutes of continuous aerobic without signs/symptoms of physical distress      Intensity   THRR 40-80% of Max Heartrate 57-114    Ratings of Perceived Exertion 11-13    Perceived Dyspnea 0-4      Resistance Training   Training Prescription Yes    Weight 3    Reps 10-15             Perform Capillary Blood Glucose checks as needed.  Exercise Prescription Changes:   Exercise Prescription Changes     Row Name 08/25/21 1546 09/08/21 1542           Response to Exercise   Blood Pressure (Admit) 108/78 108/56      Blood Pressure (Exercise) 115/60 120/58      Blood Pressure (Exit) 98/48 106/50      Heart Rate (Admit) 50 bpm 74 bpm      Heart Rate (Exercise) 66 bpm 73 bpm      Heart Rate (Exit) 57 bpm 65 bpm      Rating of Perceived Exertion (Exercise) 12 12      Duration Continue with 30 min of aerobic exercise without signs/symptoms of physical distress. Continue with 30 min of aerobic exercise without signs/symptoms of physical distress.      Intensity THRR unchanged THRR unchanged        Progression   Progression Continue to progress workloads to maintain intensity without signs/symptoms of physical distress. Continue to progress workloads to maintain intensity without signs/symptoms of physical distress.        Resistance Training   Training Prescription Yes Yes      Weight 3 3      Reps 10-15 10-15      Time 10 Minutes 10 Minutes        NuStep   Level 1 2      SPM 69 76      Minutes 17 17      METs 1.81 2.11        Arm Ergometer   Level 1 1.5      RPM 42 44      Minutes 22 22      METs 1.78 2.04               Exercise Comments:   Exercise Goals and Review:   Exercise Goals     Row Name 08/20/21 1432 09/08/21 1544           Exercise Goals   Increase Physical Activity Yes Yes      Intervention Provide advice, education, support and counseling about physical activity/exercise needs.;Develop an individualized  exercise prescription for aerobic and resistive training based on initial evaluation findings, risk stratification, comorbidities and participant's personal goals. Provide advice, education, support and counseling about physical activity/exercise needs.;Develop an individualized exercise prescription for aerobic and resistive training based on initial evaluation findings, risk stratification, comorbidities and participant's personal goals.      Expected Outcomes Short Term: Attend rehab on a regular basis to increase amount of physical activity.;Long Term: Add in home exercise to make exercise part of routine and to increase amount of physical activity.;Long Term: Exercising regularly at least 3-5 days a week. Short Term: Attend rehab on a regular basis to increase amount of physical activity.;Long Term: Add in home exercise to make exercise part of routine and to increase  amount of physical activity.;Long Term: Exercising regularly at least 3-5 days a week.      Increase Strength and Stamina Yes Yes      Intervention Provide advice, education, support and counseling about physical activity/exercise needs.;Develop an individualized exercise prescription for aerobic and resistive training based on initial evaluation findings, risk stratification, comorbidities and participant's personal goals. Provide advice, education, support and counseling about physical activity/exercise needs.;Develop an individualized exercise prescription for aerobic and resistive training based on initial evaluation findings, risk stratification, comorbidities and participant's personal goals.      Expected Outcomes Short Term: Increase workloads from initial exercise prescription for resistance, speed, and METs.;Short Term: Perform resistance training exercises routinely during rehab and add in resistance training at home;Long Term: Improve cardiorespiratory fitness, muscular endurance and strength as measured by increased METs and  functional capacity (6MWT) Short Term: Increase workloads from initial exercise prescription for resistance, speed, and METs.;Short Term: Perform resistance training exercises routinely during rehab and add in resistance training at home;Long Term: Improve cardiorespiratory fitness, muscular endurance and strength as measured by increased METs and functional capacity (6MWT)      Able to understand and use rate of perceived exertion (RPE) scale Yes Yes      Intervention Provide education and explanation on how to use RPE scale Provide education and explanation on how to use RPE scale      Expected Outcomes Short Term: Able to use RPE daily in rehab to express subjective intensity level;Long Term:  Able to use RPE to guide intensity level when exercising independently Short Term: Able to use RPE daily in rehab to express subjective intensity level;Long Term:  Able to use RPE to guide intensity level when exercising independently      Knowledge and understanding of Target Heart Rate Range (THRR) Yes Yes      Intervention Provide education and explanation of THRR including how the numbers were predicted and where they are located for reference Provide education and explanation of THRR including how the numbers were predicted and where they are located for reference      Expected Outcomes Long Term: Able to use THRR to govern intensity when exercising independently;Short Term: Able to state/look up THRR;Short Term: Able to use daily as guideline for intensity in rehab Long Term: Able to use THRR to govern intensity when exercising independently;Short Term: Able to state/look up THRR;Short Term: Able to use daily as guideline for intensity in rehab      Able to check pulse independently Yes Yes      Intervention Provide education and demonstration on how to check pulse in carotid and radial arteries.;Review the importance of being able to check your own pulse for safety during independent exercise Provide  education and demonstration on how to check pulse in carotid and radial arteries.;Review the importance of being able to check your own pulse for safety during independent exercise      Expected Outcomes Short Term: Able to explain why pulse checking is important during independent exercise;Long Term: Able to check pulse independently and accurately Short Term: Able to explain why pulse checking is important during independent exercise;Long Term: Able to check pulse independently and accurately      Understanding of Exercise Prescription Yes Yes      Intervention Provide education, explanation, and written materials on patient's individual exercise prescription Provide education, explanation, and written materials on patient's individual exercise prescription      Expected Outcomes Long Term: Able to explain home exercise prescription  to exercise independently;Short Term: Able to explain program exercise prescription Long Term: Able to explain home exercise prescription to exercise independently;Short Term: Able to explain program exercise prescription               Exercise Goals Re-Evaluation :  Exercise Goals Re-Evaluation     [place] Name 09/08/21 1544             Exercise Goal Re-Evaluation   Exercise Goals Review Increase Physical Activity;Increase Strength and Stamina;Able to understand and use rate of perceived exertion (RPE) scale;Knowledge and understanding of Target Heart Rate Range (THRR);Able to check pulse independently;Understanding of Exercise Prescription       Comments Pt has complete 8 sessions of cardiac rehab. He is motivated when he is in class but takes frequent breaka. He is progressing slowely. He has had recent low BP near the end of exercise that goes up after drinking water. He is currently exercising at 2.11 METs on the stepper. Will continue to monitor and progess as able.       Expected Outcomes Through exercise at rehab and at home, the patient will meet their  stated goals.                 Discharge Exercise Prescription (Final Exercise Prescription Changes):  Exercise Prescription Changes - 09/08/21 1542       Response to Exercise   Blood Pressure (Admit) 108/56    Blood Pressure (Exercise) 120/58    Blood Pressure (Exit) 106/50    Heart Rate (Admit) 74 bpm    Heart Rate (Exercise) 73 bpm    Heart Rate (Exit) 65 bpm    Rating of Perceived Exertion (Exercise) 12    Duration Continue with 30 min of aerobic exercise without signs/symptoms of physical distress.    Intensity THRR unchanged      Progression   Progression Continue to progress workloads to maintain intensity without signs/symptoms of physical distress.      Resistance Training   Training Prescription Yes    Weight 3    Reps 10-15    Time 10 Minutes      NuStep   Level 2    SPM 76    Minutes 17    METs 2.11      Arm Ergometer   Level 1.5    RPM 44    Minutes 22    METs 2.04             Nutrition:  Target Goals: Understanding of nutrition guidelines, daily intake of sodium 1500mg , cholesterol 200mg , calories 30% from fat and 7% or less from saturated fats, daily to have 5 or more servings of fruits and vegetables.  Biometrics:  Pre Biometrics - 08/20/21 1433       Pre Biometrics   Height 5\' 11"  (1.803 m)    Weight 59.4 kg    Waist Circumference 31 inches    Hip Circumference 36 inches    Waist to Hip Ratio 0.86 %    BMI (Calculated) 18.27    Triceps Skinfold 11 mm    % Body Fat 18.9 %    Grip Strength 28 kg    Flexibility 0 in    Single Leg Stand 1 seconds              Nutrition Therapy Plan and Nutrition Goals:   Nutrition Assessments:  Nutrition Assessments - 08/20/21 1356       MEDFICTS Scores   Pre Score 19  MEDIFICTS Score Key: ?70 Need to make dietary changes  40-70 Heart Healthy Diet ? 40 Therapeutic Level Cholesterol Diet   Picture Your Plate Scores: <80 Unhealthy dietary pattern with much room  for improvement. 41-50 Dietary pattern unlikely to meet recommendations for good health and room for improvement. 51-60 More healthful dietary pattern, with some room for improvement.  >60 Healthy dietary pattern, although there may be some specific behaviors that could be improved.    Nutrition Goals Re-Evaluation:   Nutrition Goals Discharge (Final Nutrition Goals Re-Evaluation):   Psychosocial: Target Goals: Acknowledge presence or absence of significant depression and/or stress, maximize coping skills, provide positive support system. Participant is able to verbalize types and ability to use techniques and skills needed for reducing stress and depression.  Initial Review & Psychosocial Screening:  Initial Psych Review & Screening - 08/20/21 1412       Initial Review   Current issues with None Identified      Family Dynamics   Good Support System? Yes      Barriers   Psychosocial barriers to participate in program There are no identifiable barriers or psychosocial needs.      Screening Interventions   Interventions Provide feedback about the scores to participant    Expected Outcomes Short Term goal: Identification and review with participant of any Quality of Life or Depression concerns found by scoring the questionnaire.             Quality of Life Scores:  Quality of Life - 08/20/21 1433       Quality of Life   Select Quality of Life      Quality of Life Scores   Health/Function Pre 21.85 %    Socioeconomic Pre 25.64 %    Psych/Spiritual Pre 28.93 %    Family Pre 26.4 %    GLOBAL Pre 24.94 %            Scores of 19 and below usually indicate a poorer quality of life in these areas.  A difference of  2-3 points is a clinically meaningful difference.  A difference of 2-3 points in the total score of the Quality of Life Index has been associated with significant improvement in overall quality of life, self-image, physical symptoms, and general health in  studies assessing change in quality of life.  PHQ-9: Recent Review Flowsheet Data     Depression screen [facility] 2/9 08/20/2021   Decreased Interest 0   Down, Depressed, Hopeless 0   PHQ - 2 Score 0   Altered sleeping 0   Tired, decreased energy 2   Change in appetite 3   Feeling bad or failure about yourself  0   Trouble concentrating 0   Moving slowly or fidgety/restless 0   Suicidal thoughts 0   PHQ-9 Score 5   Difficult doing work/chores Somewhat difficult      Interpretation of Total Score  Total Score Depression Severity:  1-4 = Minimal depression, 5-9 = Mild depression, 10-14 = Moderate depression, 15-19 = Moderately severe depression, 20-27 = Severe depression   Psychosocial Evaluation and Intervention:  Psychosocial Evaluation - 08/20/21 1416       Psychosocial Evaluation & Interventions   Interventions Stress management education;Relaxation education;Encouraged to exercise with the program and follow exercise prescription    Comments Patient has no psychosocial barriers or issues identified at his orientation visit. His initial PHQ-9 score was 5 based on a poor appetite since his surgery and decreased energy. He denies any depression,  anxiety or unmanagable stress. He is the primary caregiver of his wife who is currently being treated for breast cancer and has some mobility limitations and he does not like to leave her alone for long periods. He has 3 children. He names his wife, his children, and his chruch family as his support. He is very active in his community. 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 He is ready to start the program hoping to gain strength and stamina.    Expected Outcomes Patient will continue to have no psychosocial barriers identified.             Psychosocial Re-Evaluation:  Psychosocial Re-Evaluation     [place] Name 09/01/21 1506             Psychosocial Re-Evaluation   Current issues  with None Identified       Comments Patient is new to the program completing 3 sessions. He continues to have no psychosocial barriers or issues identified. He seems to enjoy coming to class and demonstrates an interest in improving his health. We will continue to monitor his progress.       Expected Outcomes Patient will continue to have no psychosocial barriers or issues identified.       Interventions Encouraged to attend Cardiac Rehabilitation for the exercise;Stress management education;Relaxation education       Continue Psychosocial Services  No Follow up required                Psychosocial Discharge (Final Psychosocial Re-Evaluation):  Psychosocial Re-Evaluation - 09/01/21 1506       Psychosocial Re-Evaluation   Current issues with None Identified    Comments Patient is new to the program completing 3 sessions. He continues to have no psychosocial barriers or issues identified. He seems to enjoy coming to class and demonstrates an interest in improving his health. We will continue to monitor his progress.    Expected Outcomes Patient will continue to have no psychosocial barriers or issues identified.    Interventions Encouraged to attend Cardiac Rehabilitation for the exercise;Stress management education;Relaxation education    Continue Psychosocial Services  No Follow up required             Vocational Rehabilitation: Provide vocational rehab assistance to qualifying candidates.   Vocational Rehab Evaluation & Intervention:  Vocational Rehab - 08/20/21 1357       Initial Vocational Rehab Evaluation & Intervention   Assessment shows need for Vocational Rehabilitation No      Vocational Rehab Re-Evaulation   Comments Patient is retired and does not need vocational rehab.             Education: Education Goals: Education classes will be provided on a weekly basis, covering required topics. Participant will state understanding/return demonstration of topics  presented.  Learning Barriers/Preferences:  Learning Barriers/Preferences - 08/20/21 1356       Learning Barriers/Preferences   Learning Barriers None    Learning Preferences Written Material             Education Topics: Hypertension, Hypertension Reduction -Define heart disease and high blood pressure. Discus how high blood pressure affects the body and ways to reduce high blood pressure.   Exercise and Your Heart -Discuss why it is important to exercise, the FITT principles of exercise, normal and abnormal responses to exercise, and how to exercise safely.   Angina -Discuss definition of angina, causes of angina, treatment of  angina, and how to decrease risk of having angina.   Cardiac Medications -Review what the following cardiac medications are used for, how they affect the body, and side effects that may occur when taking the medications.  Medications include Aspirin, Beta blockers, calcium channel blockers, ACE Inhibitors, angiotensin receptor blockers, diuretics, digoxin, and antihyperlipidemics.   Congestive Heart Failure -Discuss the definition of CHF, how to live with CHF, the signs and symptoms of CHF, and how keep track of weight and sodium intake.   Heart Disease and Intimacy -Discus the effect sexual activity has on the heart, how changes occur during intimacy as we age, and safety during sexual activity.   Smoking Cessation / COPD -Discuss different methods to quit smoking, the health benefits of quitting smoking, and the definition of COPD. Flowsheet Row CARDIAC REHAB PHASE II EXERCISE from 09/03/2021 in [place]  Date 08/27/21  Educator pb  Instruction Review Code 1- Verbalizes Understanding       Nutrition I: Fats -Discuss the types of cholesterol, what cholesterol does to the heart, and how cholesterol levels can be controlled. Flowsheet Row CARDIAC REHAB PHASE II EXERCISE from 09/03/2021 in [place]  Date 09/03/21  Educator pb  Instruction Review Code 1- Verbalizes Understanding       Nutrition II: Labels -Discuss the different components of food labels and how to read food label   Heart Parts/Heart Disease and PAD -Discuss the anatomy of the heart, the pathway of blood circulation through the heart, and these are affected by heart disease.   Stress I: Signs and Symptoms -Discuss the causes of stress, how stress may lead to anxiety and depression, and ways to limit stress.   Stress II: Relaxation -Discuss different types of relaxation techniques to limit stress.   Warning Signs of Stroke / TIA -Discuss definition of a stroke, what the signs and symptoms are of a stroke, and how to identify when someone is having stroke.   Knowledge Questionnaire Score:  Knowledge Questionnaire Score - 08/20/21 1357       Knowledge Questionnaire Score   Pre Score 20/24             Core Components/Risk Factors/Patient Goals at Admission:  Personal Goals and Risk Factors at Admission - 08/20/21 1413       Core Components/Risk Factors/Patient Goals on Admission    Weight Management Weight Maintenance    Lipids Yes    Intervention Provide education and support for participant on nutrition & aerobic/resistive exercise along with prescribed medications to achieve LDL 70mg , HDL >40mg .    Expected Outcomes Short Term: Participant states understanding of desired cholesterol values and is compliant with medications prescribed. Participant is following exercise prescription and nutrition guidelines.;Long Term: Cholesterol controlled with medications as prescribed, with individualized exercise RX and with personalized nutrition plan. Value goals: LDL < 70mg , HDL > 40 mg.    Personal Goal Other Yes    Personal Goal Patient wants to improve his strength and stamina and mobility. He wants to be able to continue and care for his wife and do his home management task.     Intervention Patient will attend CR 3 days/week with exercise and education and supplement with exercise at home.    Expected Outcomes Patient will meet both program and personal goals.             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 He has multiple risk factors for CAD and is participating in the program for risk modification. He current weight is 133.2 lbs up 2 lbs from his intial weight. He has completed 3 sessions. His blood pressure is well controlled and soft at times. His personal goals for the program are to increase his strength and stamina and mobility and be able to continue to care for his wife and do his ADL's. We will continue to monitor his progress as he works towards meeting these goals.       Expected Outcomes Patient will complete the program meeting both personal and program goals.                Core Components/Risk Factors/Patient Goals at Discharge (Final Review):   Goals and Risk Factor Review - 09/01/21 1508       Core Components/Risk Factors/Patient Goals Review   Personal Goals Review Weight Management/Obesity;Lipids;Other    Review Patient was referred to CR with NSTEMI/CABGx5. He has multiple risk factors for CAD and is participating in the program for risk modification. He current weight is 133.2 lbs up 2 lbs from his intial weight. He has completed 3 sessions. His blood pressure is well controlled and soft at times. His personal goals for the program are to increase his strength and stamina and mobility and be able to continue to care for his wife and do his ADL's. We will continue to monitor his progress as he works towards meeting these goals.    Expected Outcomes Patient will complete the  program meeting both personal and program goals.             ITP Comments:   Comments: ITP REVIEW Pt is making expected progress toward Cardiac Rehab goals after completing 8 sessions. Recommend continued exercise, life style modification, education, and increased stamina and strength.

## 2021-09-12 ENCOUNTER — Encounter (HOSPITAL_COMMUNITY): Payer: Medicare Other

## 2021-09-15 ENCOUNTER — Encounter (HOSPITAL_COMMUNITY): Payer: Medicare Other

## 2021-09-17 ENCOUNTER — Encounter (HOSPITAL_COMMUNITY): Payer: Medicare Other

## 2021-09-18 ENCOUNTER — Other Ambulatory Visit: Payer: Self-pay | Admitting: Physician Assistant

## 2021-09-19 ENCOUNTER — Encounter (HOSPITAL_COMMUNITY): Payer: Medicare Other

## 2021-09-22 ENCOUNTER — Encounter (HOSPITAL_COMMUNITY)
Admission: RE | Admit: 2021-09-22 | Discharge: 2021-09-22 | Disposition: A | Discharge status: Home / Self Care | Payer: Medicare Other | Source: Ambulatory Visit | Attending: Cardiovascular Disease | Admitting: Cardiovascular Disease

## 2021-09-22 VITALS — Wt 136.7 lb

## 2021-09-22 DIAGNOSIS — Z951 Presence of aortocoronary bypass graft: Secondary | ICD-10-CM

## 2021-09-22 DIAGNOSIS — I214 Non-ST elevation (NSTEMI) myocardial infarction: Secondary | ICD-10-CM

## 2021-09-22 NOTE — Progress Notes (Signed)
Daily Session Note ? ?Patient Details  ?Name: Adam [name] ?MRN: [id number] ?Date of Birth: 27-May-1941 ?Referring Provider:   ?Flowsheet Row CARDIAC REHAB PHASE II ORIENTATION from 08/20/2021 in [place]  ?Referring Provider Dr. Roxan Hockey  ? ?  ? ? ?Encounter Date: 09/22/2021 ? ?Check In: ? Session Check In - 09/22/21 1445   ? ?  ? Check-In  ? Supervising physician immediately available to respond to emergencies [facility] MD immediately available   ? Physician(s) Dr. Johney Frame   ? Location [facility]   ? Staff Present Geanie Cooley, RN;Dalton Kris Mouton, MS, ACSM-CEP, Exercise Physiologist;Debra Wynetta Emery, RN, BSN   ? Virtual Visit No   ? Medication changes reported     No   ? Fall or balance concerns reported    Yes   ? Comments History of 2 falls. Patient complains of feeling dizzy when he gets up too fast.   ? Tobacco Cessation No Change   ? Warm-up and Cool-down Performed as group-led instruction   ? Resistance Training Performed Yes   ? VAD Patient? No   ? PAD/SET Patient? No   ?  ? Pain Assessment  ? Currently in Pain? No/denies   ? Multiple Pain Sites No   ? ?  ?  ? ?  ? ? ?Capillary Blood Glucose: ?No results found for this or any previous visit (from the past 24 hour(s)). ? ? ? ?Social History  ? ?Tobacco Use  ?Smoking Status Never  ?Smokeless Tobacco Never  ? ? ?Goals Met:  ?Independence with exercise equipment ?Exercise tolerated well ?No report of concerns or symptoms today ?Strength training completed today ? ?Goals Unmet:  ?Not Applicable ? ?Comments: checkout @ 3:45pm ? ? ?Dr. Carlyle Dolly is Medical Director for [place] ?

## 2021-09-24 ENCOUNTER — Telehealth: Payer: Self-pay | Admitting: Student

## 2021-09-24 ENCOUNTER — Encounter (HOSPITAL_COMMUNITY)
Admission: RE | Admit: 2021-09-24 | Discharge: 2021-09-24 | Disposition: A | Discharge status: Home / Self Care | Payer: Medicare Other | Source: Ambulatory Visit | Attending: Cardiovascular Disease | Admitting: Cardiovascular Disease

## 2021-09-24 DIAGNOSIS — I214 Non-ST elevation (NSTEMI) myocardial infarction: Secondary | ICD-10-CM | POA: Diagnosis not present

## 2021-09-24 DIAGNOSIS — Z951 Presence of aortocoronary bypass graft: Secondary | ICD-10-CM | POA: Diagnosis not present

## 2021-09-24 NOTE — Telephone Encounter (Signed)
Pt walks in. Said he is on Metoprolol. He states that the medication makes his heart rate slow, and want to know why he is on this medication. Says his heart rate has been in the 40 range. He goes to cardiac rehab, and his heart rate is low there. Asked to talk with Adam [name] or, a nurse. TIA  ?

## 2021-09-24 NOTE — Telephone Encounter (Signed)
? ?  He is on Metoprolol as beta-blockers help to strengthen the heart muscle after a heart attack and also reduce morbidity and mortality from a cardiac perspective going forward. However, some people are unable to tolerate these medications due to bradycardia or hypotension.  He is on Toprol-XL 25 mg daily and I would recommend he cut this in half to 12.5 mg daily. He should be finished with his course of Amiodarone as it was recommended to stop this on 09/09/2021. If his HR remains low on the reduced dose of Toprol-XL, may need to stop altogether. ? ?Signed, ?Erma Heritage, PA-C ?09/24/2021, 4:31 PM ?Pager: [phone] ? ?

## 2021-09-24 NOTE — Progress Notes (Signed)
Daily Session Note ? ?Patient Details  ?Name: Adam [name] ?MRN: [id number] ?Date of Birth: September 21, 1940 ?Referring Provider:   ?Flowsheet Row CARDIAC REHAB PHASE II ORIENTATION from 08/20/2021 in [place]  ?Referring Provider Dr. Roxan Hockey  ? ?  ? ? ?Encounter Date: 09/24/2021 ? ?Check In: ? Session Check In - 09/24/21 1445   ? ?  ? Check-In  ? Supervising physician immediately available to respond to emergencies [facility] MD immediately available   ? Physician(s) Dr Harl Bowie   ? Location [facility]   ? Staff Present Hoy Register, MS, ACSM-CEP, Exercise Physiologist;Heather Zigmund Daniel, Exercise Physiologist;Vidur Knust Hassell Done, RN, BSN   ? Virtual Visit No   ? Medication changes reported     No   ? Fall or balance concerns reported    Yes   ? Comments History of 2 falls. Patient complains of feeling dizzy when he gets up too fast.   ? Tobacco Cessation No Change   ? Warm-up and Cool-down Performed as group-led instruction   ? Resistance Training Performed No   ? VAD Patient? No   ? PAD/SET Patient? No   ?  ? Pain Assessment  ? Currently in Pain? No/denies   ? Multiple Pain Sites No   ? ?  ?  ? ?  ? ? ?Capillary Blood Glucose: ?No results found for this or any previous visit (from the past 24 hour(s)). ? ? ? ?Social History  ? ?Tobacco Use  ?Smoking Status Never  ?Smokeless Tobacco Never  ? ? ?Goals Met:  ?Independence with exercise equipment ?Exercise tolerated well ?No report of concerns or symptoms today ?Strength training completed today ? ?Goals Unmet:  ?Not Applicable ? ?Comments: Check out 1545 ? ? ? ?Dr. Carlyle Dolly is Medical Director for [place] ?

## 2021-09-25 NOTE — Telephone Encounter (Signed)
Left a message for pt to call office back.  °

## 2021-09-26 ENCOUNTER — Encounter (HOSPITAL_COMMUNITY)
Admission: RE | Admit: 2021-09-26 | Discharge: 2021-09-26 | Disposition: A | Discharge status: Home / Self Care | Payer: Medicare Other | Source: Ambulatory Visit | Attending: Cardiovascular Disease | Admitting: Cardiovascular Disease

## 2021-09-26 DIAGNOSIS — I214 Non-ST elevation (NSTEMI) myocardial infarction: Secondary | ICD-10-CM

## 2021-09-26 DIAGNOSIS — Z951 Presence of aortocoronary bypass graft: Secondary | ICD-10-CM | POA: Diagnosis not present

## 2021-09-26 NOTE — Progress Notes (Signed)
Daily Session Note ? ?Patient Details  ?Name: Adam [name] ?MRN: [id number] ?Date of Birth: 05-28-1941 ?Referring Provider:   ?Flowsheet Row CARDIAC REHAB PHASE II ORIENTATION from 08/20/2021 in [place]  ?Referring Provider Dr. Roxan Hockey  ? ?  ? ? ?Encounter Date: 09/26/2021 ? ?Check In: ? Session Check In - 09/26/21 1453   ? ?  ? Check-In  ? Supervising physician immediately available to respond to emergencies [facility] MD immediately available   ? Physician(s) Dr Harl Bowie   ? Location [facility]   ? Staff Present Geanie Cooley, RN;Debra Wynetta Emery, RN, Joanette Gula, RN, Madlyn Frankel, RN, BSN   ? Virtual Visit No   ? Medication changes reported     No   ? Fall or balance concerns reported    Yes   ? Comments History of 2 falls. Patient complains of feeling dizzy when he gets up too fast.   ? Tobacco Cessation No Change   ? Warm-up and Cool-down Performed as group-led instruction   ? Resistance Training Performed No   ? VAD Patient? No   ? PAD/SET Patient? No   ?  ? Pain Assessment  ? Currently in Pain? No/denies   ? Multiple Pain Sites No   ? ?  ?  ? ?  ? ? ?Capillary Blood Glucose: ?No results found for this or any previous visit (from the past 24 hour(s)). ? ? ? ?Social History  ? ?Tobacco Use  ?Smoking Status Never  ?Smokeless Tobacco Never  ? ? ?Goals Met:  ?Independence with exercise equipment ?Exercise tolerated well ?No report of concerns or symptoms today ?Strength training completed today ? ?Goals Unmet:  ?Not Applicable ? ?Comments: checkout 1545 ? ? ? ?Dr. Carlyle Dolly is Medical Director for [place] ?

## 2021-09-29 ENCOUNTER — Encounter (HOSPITAL_COMMUNITY)
Admission: RE | Admit: 2021-09-29 | Discharge: 2021-09-29 | Disposition: A | Discharge status: Home / Self Care | Payer: Medicare Other | Source: Ambulatory Visit | Attending: Cardiovascular Disease | Admitting: Cardiovascular Disease

## 2021-09-29 DIAGNOSIS — Z951 Presence of aortocoronary bypass graft: Secondary | ICD-10-CM

## 2021-09-29 DIAGNOSIS — I214 Non-ST elevation (NSTEMI) myocardial infarction: Secondary | ICD-10-CM

## 2021-09-29 MED ORDER — METOPROLOL SUCCINATE ER 25 MG PO TB24: 12.5000 mg | ORAL_TABLET | Freq: Every day | ORAL | 3 refills | Status: DC

## 2021-09-29 NOTE — Telephone Encounter (Signed)
I spoke with patient and discussed reasons why we have him taking beat blockers. He will decrease toprol xl to 12.5 mg qd and update [place] as needed.He has f/u on 10/09/21 ?

## 2021-09-29 NOTE — Progress Notes (Signed)
Daily Session Note ? ?Patient Details  ?Name: Adam [name] ?MRN: [id number] ?Date of Birth: 12-Aug-1940 ?Referring Provider:   ?Flowsheet Row CARDIAC REHAB PHASE II ORIENTATION from 08/20/2021 in [place]  ?Referring Provider Dr. Roxan Hockey  ? ?  ? ? ?Encounter Date: 09/29/2021 ? ?Check In: ? Session Check In - 09/29/21 1445   ? ?  ? Check-In  ? Supervising physician immediately available to respond to emergencies [facility] MD immediately available   ? Physician(s) Dr Harl Bowie   ? Location [facility]   ? Staff Present Redge Gainer, BS, Exercise Physiologist;Debra Wynetta Emery, RN, Joanette Gula, RN, Madlyn Frankel, RN, BSN   ? Virtual Visit No   ? Medication changes reported     No   ? Fall or balance concerns reported    Yes   ? Comments History of 2 falls. Patient complains of feeling dizzy when he gets up too fast.   ? Tobacco Cessation No Change   ? Warm-up and Cool-down Performed as group-led instruction   ? Resistance Training Performed Yes   ? VAD Patient? No   ? PAD/SET Patient? No   ?  ? Pain Assessment  ? Currently in Pain? No/denies   ? Multiple Pain Sites No   ? ?  ?  ? ?  ? ? ?Capillary Blood Glucose: ?No results found for this or any previous visit (from the past 24 hour(s)). ? ? ? ?Social History  ? ?Tobacco Use  ?Smoking Status Never  ?Smokeless Tobacco Never  ? ? ?Goals Met:  ?Independence with exercise equipment ?Exercise tolerated well ?No report of concerns or symptoms today ?Strength training completed today ? ?Goals Unmet:  ?Not Applicable ? ?Comments: Checkout 1545. ? ? ?Dr. Carlyle Dolly is Medical Director for [place] ?

## 2021-10-01 ENCOUNTER — Encounter (HOSPITAL_COMMUNITY)
Admission: RE | Admit: 2021-10-01 | Discharge: 2021-10-01 | Disposition: A | Discharge status: Home / Self Care | Payer: Medicare Other | Source: Ambulatory Visit | Attending: Cardiovascular Disease | Admitting: Cardiovascular Disease

## 2021-10-01 DIAGNOSIS — I214 Non-ST elevation (NSTEMI) myocardial infarction: Secondary | ICD-10-CM | POA: Diagnosis not present

## 2021-10-01 DIAGNOSIS — Z951 Presence of aortocoronary bypass graft: Secondary | ICD-10-CM

## 2021-10-01 NOTE — Progress Notes (Signed)
Daily Session Note ? ?Patient Details  ?Name: Adam [name] ?MRN: [id number] ?Date of Birth: 09-14-1940 ?Referring Provider:   ?Flowsheet Row CARDIAC REHAB PHASE II ORIENTATION from 08/20/2021 in [place]  ?Referring Provider Dr. Roxan Hockey  ? ?  ? ? ?Encounter Date: 10/01/2021 ? ?Check In: ? Session Check In - 10/01/21 1445   ? ?  ? Check-In  ? Supervising physician immediately available to respond to emergencies [facility] MD immediately available   ? Physician(s) Dr Domenic Polite   ? Location [facility]   ? Staff Present Geanie Cooley, RN;Heather Otho Ket, BS, Exercise Physiologist;Tanekia Ryans Hassell Done, RN, Bjorn Loser, MS, ACSM-CEP, Exercise Physiologist   ? Virtual Visit No   ? Medication changes reported     No   ? Comments History of 2 falls. Patient complains of feeling dizzy when he gets up too fast.   ? Tobacco Cessation No Change   ? Warm-up and Cool-down Performed as group-led instruction   ? Resistance Training Performed Yes   ? VAD Patient? No   ? PAD/SET Patient? No   ?  ? Pain Assessment  ? Currently in Pain? No/denies   ? Multiple Pain Sites No   ? ?  ?  ? ?  ? ? ?Capillary Blood Glucose: ?No results found for this or any previous visit (from the past 24 hour(s)). ? ? ? ?Social History  ? ?Tobacco Use  ?Smoking Status Never  ?Smokeless Tobacco Never  ? ? ?Goals Met:  ?Independence with exercise equipment ?Exercise tolerated well ?No report of concerns or symptoms today ?Strength training completed today ? ?Goals Unmet:  ?Not Applicable ? ?Comments: Check out 1545. ? ? ?Dr. Carlyle Dolly is Medical Director for [place] ?

## 2021-10-03 ENCOUNTER — Encounter (HOSPITAL_COMMUNITY)
Admission: RE | Admit: 2021-10-03 | Discharge: 2021-10-03 | Disposition: A | Discharge status: Home / Self Care | Payer: Medicare Other | Source: Ambulatory Visit | Attending: Cardiovascular Disease | Admitting: Cardiovascular Disease

## 2021-10-03 DIAGNOSIS — I214 Non-ST elevation (NSTEMI) myocardial infarction: Secondary | ICD-10-CM | POA: Diagnosis not present

## 2021-10-03 DIAGNOSIS — Z951 Presence of aortocoronary bypass graft: Secondary | ICD-10-CM | POA: Diagnosis not present

## 2021-10-03 NOTE — Progress Notes (Signed)
Daily Session Note ? ?Patient Details  ?Name: Adam [name] ?MRN: [id number] ?Date of Birth: 08-Aug-1940 ?Referring Provider:   ?Flowsheet Row CARDIAC REHAB PHASE II ORIENTATION from 08/20/2021 in [place]  ?Referring Provider Dr. Roxan Hockey  ? ?  ? ? ?Encounter Date: 10/03/2021 ? ?Check In: ? Session Check In - 10/03/21 1445   ? ?  ? Check-In  ? Supervising physician immediately available to respond to emergencies [facility] MD immediately available   ? Physician(s) Dr Gardiner Rhyme   ? Location [facility]   ? Staff Present Redge Gainer, BS, Exercise Physiologist;Debra Wynetta Emery, RN, Joanette Gula, RN, Bjorn Loser, MS, ACSM-CEP, Exercise Physiologist   ? Virtual Visit No   ? Medication changes reported     No   ? Fall or balance concerns reported    Yes   ? Comments History of 2 falls. Patient complains of feeling dizzy when he gets up too fast.   ? Tobacco Cessation No Change   ? Warm-up and Cool-down Performed as group-led instruction   ? Resistance Training Performed Yes   ? VAD Patient? No   ? PAD/SET Patient? No   ?  ? Pain Assessment  ? Currently in Pain? No/denies   ? Multiple Pain Sites No   ? ?  ?  ? ?  ? ? ?Capillary Blood Glucose: ?No results found for this or any previous visit (from the past 24 hour(s)). ? ? ? ?Social History  ? ?Tobacco Use  ?Smoking Status Never  ?Smokeless Tobacco Never  ? ? ?Goals Met:  ?Independence with exercise equipment ?Exercise tolerated well ?No report of concerns or symptoms today ?Strength training completed today ? ?Goals Unmet:  ?Not Applicable ? ?Comments: Checkout at 86. ? ? ?Dr. Carlyle Dolly is Medical Director for [place] ?

## 2021-10-06 ENCOUNTER — Encounter (HOSPITAL_COMMUNITY)
Admission: RE | Admit: 2021-10-06 | Discharge: 2021-10-06 | Disposition: A | Discharge status: Home / Self Care | Payer: Medicare Other | Source: Ambulatory Visit | Attending: Cardiovascular Disease | Admitting: Cardiovascular Disease

## 2021-10-06 DIAGNOSIS — I214 Non-ST elevation (NSTEMI) myocardial infarction: Secondary | ICD-10-CM

## 2021-10-06 DIAGNOSIS — Z951 Presence of aortocoronary bypass graft: Secondary | ICD-10-CM | POA: Diagnosis not present

## 2021-10-06 NOTE — Progress Notes (Signed)
Daily Session Note ? ?Patient Details  ?Name: Adam [name] ?MRN: [id number] ?Date of Birth: 1941/02/18 ?Referring Provider:   ?Flowsheet Row CARDIAC REHAB PHASE II ORIENTATION from 08/20/2021 in [place]  ?Referring Provider Dr. Roxan Hockey  ? ?  ? ? ?Encounter Date: 10/06/2021 ? ?Check In: ? Session Check In - 10/06/21 1445   ? ?  ? Check-In  ? Supervising physician immediately available to respond to emergencies [facility] MD immediately available   ? Physician(s) Dr Marlou Porch   ? Location [facility]   ? Staff Present Geanie Cooley, RN;Debra Wynetta Emery, RN, Joanette Gula, RN, Madlyn Frankel, RN, BSN;Heather Otho Ket, BS, Exercise Physiologist   ? Virtual Visit No   ? Medication changes reported     No   ? Fall or balance concerns reported    No   ? Comments History of 2 falls. Patient complains of feeling dizzy when he gets up too fast.   ? Tobacco Cessation No Change   ? Warm-up and Cool-down Performed as group-led instruction   ? Resistance Training Performed Yes   ? VAD Patient? No   ? PAD/SET Patient? No   ?  ? Pain Assessment  ? Currently in Pain? No/denies   ? Multiple Pain Sites No   ? ?  ?  ? ?  ? ? ?Capillary Blood Glucose: ?No results found for this or any previous visit (from the past 24 hour(s)). ? ? ? ?Social History  ? ?Tobacco Use  ?Smoking Status Never  ?Smokeless Tobacco Never  ? ? ?Goals Met:  ?Independence with exercise equipment ?Exercise tolerated well ?No report of concerns or symptoms today ?Strength training completed today ? ?Goals Unmet:  ?Not Applicable ? ?Comments: Checkout 1545. ? ? ?Dr. Carlyle Dolly is Medical Director for [place] ?

## 2021-10-08 ENCOUNTER — Encounter (HOSPITAL_COMMUNITY)
Admission: RE | Admit: 2021-10-08 | Discharge: 2021-10-08 | Disposition: A | Discharge status: Home / Self Care | Payer: Medicare Other | Source: Ambulatory Visit | Attending: Cardiovascular Disease | Admitting: Cardiovascular Disease

## 2021-10-08 DIAGNOSIS — I214 Non-ST elevation (NSTEMI) myocardial infarction: Secondary | ICD-10-CM

## 2021-10-08 DIAGNOSIS — Z951 Presence of aortocoronary bypass graft: Secondary | ICD-10-CM

## 2021-10-08 NOTE — Progress Notes (Signed)
Daily Session Note ? ?Patient Details  ?Name: Adam [name] ?MRN: [id number] ?Date of Birth: [birth year] ?Referring Provider:   ?Flowsheet Row CARDIAC REHAB PHASE II ORIENTATION from 08/20/2021 in [place]  ?Referring Provider Dr. Roxan Hockey  ? ?  ? ? ?Encounter Date: 10/08/2021 ? ?Check In: ? Session Check In - 10/08/21 1445   ? ?  ? Check-In  ? Supervising physician immediately available to respond to emergencies [facility] MD immediately available   ? Physician(s) Dr Harl Bowie   ? Location [facility]   ? Staff Present Aundra Dubin, RN, Joanette Gula, RN, BSN;Heather Otho Ket, BS, Exercise Physiologist   ? Virtual Visit No   ? Medication changes reported     No   ? Fall or balance concerns reported    No   ? Comments History of 2 falls. Patient complains of feeling dizzy when he gets up too fast.   ? Warm-up and Cool-down Performed as group-led instruction   ? Resistance Training Performed Yes   ? VAD Patient? No   ? PAD/SET Patient? No   ?  ? Pain Assessment  ? Currently in Pain? No/denies   ? Multiple Pain Sites No   ? ?  ?  ? ?  ? ? ?Capillary Blood Glucose: ?No results found for this or any previous visit (from the past 24 hour(s)). ? ? ? ?Social History  ? ?Tobacco Use  ?Smoking Status Never  ?Smokeless Tobacco Never  ? ? ?Goals Met:  ?Independence with exercise equipment ?Exercise tolerated well ?No report of concerns or symptoms today ?Strength training completed today ? ?Goals Unmet:  ?Not Applicable ? ?Comments: Check out 1545. ? ? ?Dr. Carlyle Dolly is Medical Director for [place] ?

## 2021-10-08 NOTE — Progress Notes (Signed)
Cardiac Individual Treatment Plan ? ?Patient Details  ?Name: Adam [name] ?MRN: [id number] ?Date of Birth: Apr 20, 1941 ?Referring Provider:   ?Flowsheet Row CARDIAC REHAB PHASE II ORIENTATION from 08/20/2021 in [place]  ?Referring Provider Dr. Roxan Hockey  ? ?  ? ? ?Initial Encounter Date:  ?Flowsheet Row CARDIAC REHAB PHASE II ORIENTATION from 08/20/2021 in [place]  ?Date 08/20/21  ? ?  ? ? ?Visit Diagnosis: NSTEMI (non-ST elevated myocardial infarction) ([place]) ? ?S/P CABG x 5 ? ?Patient's Home Medications on Admission: ? ?Current Outpatient Medications:  ?  amiodarone (PACERONE) 200 MG tablet, Take 1 tablet (200 mg total) by mouth daily. (Patient not taking: Reported on 08/19/2021), Disp: 60 tablet, Rfl: 1 ?  aspirin EC 81 MG tablet, Take 81 mg by mouth every evening. Swallow whole., Disp: , Rfl:  ?  atorvastatin (LIPITOR) 80 MG tablet, Take 1 tablet (80 mg total) by mouth daily. (Patient taking differently: Take 80 mg by mouth every evening.), Disp: 30 tablet, Rfl: 3 ?  brimonidine (ALPHAGAN) 0.2 % ophthalmic solution, INSTILL 1 DROP INTO RIGHT EYE TWICE DAILY (Patient taking differently: Place 1 drop into both eyes in the morning and at bedtime. Take at [place]), Disp: 5 mL, Rfl: 0 ?  CHELATED ZINC PO, Take 1 tablet by mouth once a week., Disp: , Rfl:  ?  Cholecalciferol (VITAMIN D3) 50 MCG (2000 UT) TABS, Take 2,000 Units by mouth every evening., Disp: , Rfl:  ?  Chromium 200 MCG CAPS, Take 1 capsule by mouth every evening., Disp: , Rfl:  ?  clopidogrel (PLAVIX) 75 MG tablet, Take 1 tablet (75 mg total) by mouth daily., Disp: 90 tablet, Rfl: 3 ?  colchicine 0.6 MG tablet, Take 1 tablet (0.6 mg total) by mouth daily. (Patient not taking: Reported on 08/13/2021), Disp: , Rfl:  ?  Cyanocobalamin (VITAMIN B-12) 2500 MCG SUBL, Place under the tongue every evening., Disp: , Rfl:  ?  ferrous sulfate 325 (65 FE) MG tablet, Take 325 mg by mouth every evening., Disp: ,  Rfl:  ?  latanoprost (XALATAN) 0.005 % ophthalmic solution, Place 1 drop into both eyes at bedtime., Disp: , Rfl:  ?  MAGNESIUM PO, Take 250 mg by mouth every 14 (fourteen) days., Disp: , Rfl:  ?  melatonin 5 MG TABS, Take 5 mg by mouth at bedtime., Disp: , Rfl:  ?  metoprolol succinate (TOPROL XL) 25 MG 24 hr tablet, Take 0.5 tablets (12.5 mg total) by mouth daily., Disp: 45 tablet, Rfl: 3 ?  timolol (TIMOPTIC) 0.5 % ophthalmic solution, INSTILL 1 DROP INTO RIGHT EYE ONCE DAILY (Patient taking differently: Place 1 drop into both eyes 2 (two) times daily. Take Midnight and Noon daily), Disp: 5 mL, Rfl: 0 ?  VANADIUM PO, Take 1 tablet by mouth 2 (two) times a week., Disp: , Rfl:  ?  VITAMIN A PO, Take 1 tablet by mouth once a week., Disp: , Rfl:  ? ?Past Medical History: ?Past Medical History:  ?Diagnosis Date  ? Borderline diabetic   ? DIET CONTROLLED  ? CAD (coronary artery disease)   ? a. s/p NSTEMI in 05/2021 and cath showing multivessel CAD --> s/p CABG on 06/09/2021 with LIMA-LAD, SVG-PDA, seq [id number].  ? History of kidney stones   ? Hyperlipemia   ? Paratesticular mass   ? PONV (postoperative nausea and vomiting)   ? Postoperative atrial fibrillation (HCC)   ? Stroke [facility])   ? Right Eye / VISUAL  PROBLEM RESOLVED AFTER ENDARTERECTOMY  ? ? ?Tobacco Use: ?Social History  ? ?Tobacco Use  ?Smoking Status Never  ?Smokeless Tobacco Never  ? ? ?Labs: ?Review Flowsheet   ? ?  ?  Latest Ref Rng & Units 06/05/2021 06/08/2021 06/09/2021  ?Labs for ITP Cardiac and Pulmonary Rehab  ?Cholestrol 0 - 200 mg/dL 150      ?LDL (calc) 0 - 99 mg/dL 91      ?HDL-C >40 mg/dL 49      ?Trlycerides <150 mg/dL 48      ?Hemoglobin A1c 4.8 - 5.6 % 6.1      ?PH, Arterial 7.350 - 7.450  7.425   7.332    ? 7.325    ? 7.352    ? 7.444    ? 7.492    ? 7.515    ? 7.521    ? 7.493    ?PCO2 arterial 32.0 - 48.0 mmHg  42.4   40.4    ? 43.6    ? 41.6    ? 39.4    ? 32.7    ? 32.9    ? 33.2    ? 36.0    ?Bicarbonate 20.0 - 28.0 mmol/L  27.3    21.5    ? 23.0    ? 23.4    ? 27.0    ? 25.0    ? 26.6    ? 27.2    ? 27.3    ? 27.7    ?TCO2 22 - 32 mmol/L   23    ? 24    ? 25    ? 26    ? 28    ? 26    ? 26    ? 28    ? 28    ? 26    ? 29    ? 29    ? 28    ? 29    ?Acid-base deficit 0.0 - 2.0 mmol/L   4.0    ? 3.0    ? 2.0    ?O2 Saturation %  97.4   100.0    ? 99.0    ? 99.0    ? 100.0    ? 100.0    ? 100.0    ? 100.0    ? 79.0    ? 100.0    ?  ? ? Multiple values from one day are sorted in reverse-chronological order  ?  ?  ? ? ?Capillary Blood Glucose: ?Lab Results  ?Component Value Date  ? GLUCAP 103 (H) 08/25/2021  ? GLUCAP 148 (H) 06/12/2021  ? GLUCAP 178 (H) 06/12/2021  ? GLUCAP 179 (H) 06/12/2021  ? GLUCAP 161 (H) 06/11/2021  ? ? ? ?Exercise Target Goals: ?Exercise Program Goal: ?Individual exercise prescription set using results from initial 6 min walk test and THRR while considering  patient?s activity barriers and safety.  ? ?Exercise Prescription Goal: ?Starting with aerobic activity 30 plus minutes a day, 3 days per week for initial exercise prescription. Provide home exercise prescription and guidelines that participant acknowledges understanding prior to discharge. ? ?Activity Barriers & Risk Stratification: ? Activity Barriers & Cardiac Risk Stratification - 08/20/21 1303   ? ?  ? Activity Barriers & Cardiac Risk Stratification  ? Activity Barriers None   ? Cardiac Risk Stratification High   ? ?  ?  ? ?  ? ? ?6 Minute Walk: ? 6 Minute Walk   ? ? [place] Name 08/20/21 1429  ?  ?  ?  ?  6 Minute Walk  ? Phase Initial    ? Distance 1000 feet    ? Walk Time 6 minutes    ? # of Rest Breaks 0    ? MPH 1.9    ? METS 2.15    ? RPE 12    ? VO2 Peak 7.54    ? Symptoms No    ? Resting HR 55 bpm    ? Resting BP 86/50    ? Resting Oxygen Saturation  98 %    ? Exercise Oxygen Saturation  during 6 min walk 98 %    ? Max Ex. HR 65 bpm    ? Max Ex. BP 108/50    ? 2 Minute Post BP 98/50    ? ?  ?  ? ?  ? ? ?Oxygen Initial Assessment: ? ? ?Oxygen  Re-Evaluation: ? ? ?Oxygen Discharge (Final Oxygen Re-Evaluation): ? ? ?Initial Exercise Prescription: ? Initial Exercise Prescription - 08/20/21 1400   ? ?  ? Date of Initial Exercise RX and Referring Provider  ? Date 08/20/21   ? Referring Provider Dr. Roxan Hockey   ? Expected Discharge Date 11/07/21   ?  ? NuStep  ? Level 1   ? SPM 60   ? Minutes 17   ?  ? Arm Ergometer  ? Level 1   ? RPM 50   ? Minutes 22   ?  ? Prescription Details  ? Frequency (times per week) 3   ? Duration Progress to 30 minutes of continuous aerobic without signs/symptoms of physical distress   ?  ? Intensity  ? THRR 40-80% of Max Heartrate 57-114   ? Ratings of Perceived Exertion 11-13   ? Perceived Dyspnea 0-4   ?  ? Resistance Training  ? Training Prescription Yes   ? Weight 3   ? Reps 10-15   ? ?  ?  ? ?  ? ? ?Perform Capillary Blood Glucose checks as needed. ? ?Exercise Prescription Changes: ? ? Exercise Prescription Changes   ? ? [place] Name 08/25/21 1546 09/08/21 1542 09/22/21 1558 10/06/21 1552  ?  ?  ? Response to Exercise  ? Blood Pressure (Admit) 108/78 108/56 86/42 108/52   ? Blood Pressure (Exercise) 115/60 120/58 112/52 96/48   ? Blood Pressure (Exit) 98/48 1'06/50 82/50 92/48 '$   ? Heart Rate (Admit) 50 bpm 74 bpm 51 bpm 62 bpm   ? Heart Rate (Exercise) 66 bpm 73 bpm 82 bpm 86 bpm   ? Heart Rate (Exit) 57 bpm 65 bpm 60 bpm 67 bpm   ? Rating of Perceived Exertion (Exercise) '12 12 12 12   '$ ? Duration Continue with 30 min of aerobic exercise without signs/symptoms of physical distress. Continue with 30 min of aerobic exercise without signs/symptoms of physical distress. Continue with 30 min of aerobic exercise without signs/symptoms of physical distress. Continue with 30 min of aerobic exercise without signs/symptoms of physical distress.   ? Intensity THRR unchanged THRR unchanged THRR unchanged THRR unchanged   ?  ? Progression  ? Progression Continue to progress workloads to maintain intensity without signs/symptoms of physical  distress. Continue to progress workloads to maintain intensity without signs/symptoms of physical distress. Continue to progress workloads to maintain intensity without signs/symptoms of physical distress. Continue to progr

## 2021-10-09 ENCOUNTER — Encounter: Payer: Self-pay | Admitting: Student

## 2021-10-09 ENCOUNTER — Ambulatory Visit: Payer: Medicare Other | Admitting: Student

## 2021-10-09 VITALS — BP 96/50 | HR 64 | Ht 70.0 in | Wt 141.8 lb

## 2021-10-09 DIAGNOSIS — I6523 Occlusion and stenosis of bilateral carotid arteries: Secondary | ICD-10-CM

## 2021-10-09 DIAGNOSIS — I2510 Atherosclerotic heart disease of native coronary artery without angina pectoris: Secondary | ICD-10-CM

## 2021-10-09 DIAGNOSIS — E785 Hyperlipidemia, unspecified: Secondary | ICD-10-CM

## 2021-10-09 NOTE — Patient Instructions (Signed)
Medication Instructions:  ? ?Stop Toprol-XL.  ? ?*If you need a refill on your cardiac medications before your next appointment, please call your pharmacy* ? ? ?Follow-Up: ?At [facility], you and your health needs are our priority.  As part of our continuing mission to provide you with exceptional heart care, we have created designated Provider Care Teams.  These Care Teams include your primary Cardiologist (physician) and Advanced Practice Providers (APPs -  Physician Assistants and Nurse Practitioners) who all work together to provide you with the care you need, when you need it. ? ?We recommend signing up for the patient portal called "MyChart".  Sign up information is provided on this After Visit Summary.  MyChart is used to connect with patients for Virtual Visits (Telemedicine).  Patients are able to view lab/test results, encounter notes, upcoming appointments, etc.  Non-urgent messages can be sent to your provider as well.   ?To learn more about what you can do with MyChart, go to [email or web address].   ? ?Your next appointment:   ?6 month(s) ? ?The format for your next appointment:   ?In Person ? ?Provider:   ?You may see Rozann Lesches, MD or one of the following Advanced Practice Providers on your designated Care Team:   ?Bernerd Pho, PA-C  ?Ermalinda Barrios, PA-C { ? ? ?

## 2021-10-09 NOTE — Progress Notes (Signed)
? ?Cardiology Office Note   ? ?Date:  10/09/2021  ? ?ID:  JAQUARI RECKNER, DOB 08-01-1940, MRN [id number] ? ?PCP:  Celene Squibb, MD  ?Cardiologist: Rozann Lesches, MD   ? ?Chief Complaint  ?Patient presents with  ? Follow-up  ?  3 month visit  ? ? ?History of Present Illness:   ? ?Adam [name] is a 81 y.o. male with past medical history of CAD (s/p NSTEMI in 05/2021 with cath showing multivessel CAD --> s/p CABG with LIMA-LAD, SVG-PDA, seq [id number] and complicated by Dressler Syndrome), HLD, carotid artery stenosis (s/p R CEA in 2012) and postoperative atrial fibrillation who presents to the office today for 27-monthfollow-up. ? ?He was last examined by myself in 06/2021 following his recent CABG and was overall doing well at that time and denied any recent anginal symptoms. Given his bradycardia, Lopressor was reduced from 25 mg twice daily to Toprol-XL 25 mg daily. It was recommend that he continue on Colchicine for at least a 353-monthourse along with Amiodarone and could likely stop both in 08/2021. ? ?He did notify the office earlier this month that he was still having bradycardia, therefore Toprol-XL was cut down to 12.5 mg daily. ? ?In talking with the patient today, he reports overall feeling well since his last visit. He denies any recent chest pain or dyspnea on exertion. He will complete cardiac rehab in 11/2021. He denies any recent orthopnea, PND or pitting edema. Reports he had issues with bradycardia and hypotension prior to CABG as well but this has progressed since being on Toprol-XL. His wife did pass away earlier this month from cancer.  ? ? ?Past Medical History:  ?Diagnosis Date  ? Borderline diabetic   ? DIET CONTROLLED  ? CAD (coronary artery disease)   ? a. s/p NSTEMI in 05/2021 and cath showing multivessel CAD --> s/p CABG on 06/09/2021 with LIMA-LAD, SVG-PDA, seq [id number].  ? History of kidney stones   ? Hyperlipemia   ? Paratesticular mass   ? PONV (postoperative nausea and  vomiting)   ? Postoperative atrial fibrillation ([facility])   [facility]  ? Right Eye / VISUAL PROBLEM RESOLVED AFTER ENDARTERECTOMY  ? ? ?Past Surgical History:  ?Procedure Laterality Date  ? APPENDECTOMY  07/1972  ? [place]  ? CAROTID ENDARTERECTOMY  10/03/10  ? MCE[place]ader? CATARACT EXTRACTION W/PHACO  01/26/2011  ? Procedure: CATARACT EXTRACTION PHACO AND INTRAOCULAR LENS PLACEMENT (IOC);  Surgeon: CaWilliams Che Location: [facility];  Service: Ophthalmology;  Laterality: Right;  CDE:8.64  ? CATARACT EXTRACTION W/PHACO Left 02/12/2014  ? Procedure: CATARACT EXTRACTION PHACO AND INTRAOCULAR LENS PLACEMENT (IOC);  Surgeon: CaWilliams CheMD;  Location: [facility];  Service: Ophthalmology;  Laterality: Left;  CDE 6.25  ? CORONARY ARTERY BYPASS GRAFT N/A 06/09/2021  ? Procedure: CORONARY ARTERY BYPASS GRAFTING (CABG), ON PUMP, TIMES FIVE, USING LEFT INTERNAL MAMMARY ARTERY AND RIGHT ENDOSCOPICALLY HARVESTED GREATER SAPHENOUS VEIN;  Surgeon: HeMelrose NakayamaMD;  [place] [facility] Service: [place] [facility];  Laterality: N/A;  ? CYSTOSCOPY  1970  ? MD  ? EYE SURGERY  06/30/2010  ? detached retina, gsbo  ? EYE SURGERY Left Nov 26, 2014  ? Detached Retina  ? HYDROCELE EXCISION Left 09/30/2015  ? Procedure: HYDROCELECTOMY ADULT;  Surgeon: PaCleon GustinMD;  Location: [facility];  Service: Urology;  Laterality: Left;  ? IRRIGATION AND DEBRIDEMENT SEBACEOUS CYST  2001, 2007  ? FL, [place], in  MD office  ? LEFT HEART CATH AND CORONARY ANGIOGRAPHY N/A 06/06/2021  ? Procedure: LEFT HEART CATH AND CORONARY ANGIOGRAPHY;  Surgeon: Sherren Mocha, MD;  Location: [employer] [facility];  Service: Cardiovascular;  Laterality: N/A;  ? SKIN CANCER EXCISION  2010  ? pre cancerous tissue:nose, scalp, DR. Hall's office  ? TEE WITHOUT CARDIOVERSION N/A 06/09/2021  ? Procedure: TRANSESOPHAGEAL ECHOCARDIOGRAM (TEE);  Surgeon: [name], MD;  Location: [place];  Service: Open Heart Surgery;  Laterality: N/A;  ? TESTICULAR EXPLORATION  Left 09/30/2015  ? Procedure: INGUINAL PARATESTICULAR  MASS REMOVAL;  Surgeon: Cleon Gustin, MD;  Location: [facility];  Service: Urology;  Laterality: Left;  ? YAG LASER APPLICATION Right 11/18/5275  ? Procedure: YAG LASER APPLICATION;  Surgeon: Williams Che, MD;  Location: [facility];  Service: Ophthalmology;  Laterality: Right;  ? ? ?Current Medications: ?Outpatient Medications Prior to Visit  ?Medication Sig Dispense Refill  ? aspirin EC 81 MG tablet Take 81 mg by mouth every evening. Swallow whole.    ? atorvastatin (LIPITOR) 80 MG tablet Take 1 tablet (80 mg total) by mouth daily. (Patient taking differently: Take 80 mg by mouth every evening.) 30 tablet 3  ? brimonidine (ALPHAGAN) 0.2 % ophthalmic solution INSTILL 1 DROP INTO RIGHT EYE TWICE DAILY (Patient taking differently: Place 1 drop into both eyes in the morning and at bedtime. Take at Midnight and Noon) 5 mL 0  ? CHELATED ZINC PO Take 1 tablet by mouth once a week.    ? Cholecalciferol (VITAMIN D3) 50 MCG (2000 UT) TABS Take 2,000 Units by mouth every evening.    ? Chromium 200 MCG CAPS Take 1 capsule by mouth every evening.    ? clopidogrel (PLAVIX) 75 MG tablet Take 1 tablet (75 mg total) by mouth daily. 90 tablet 3  ? Cyanocobalamin (VITAMIN B-12) 2500 MCG SUBL Place under the tongue every evening.    ? ferrous sulfate 325 (65 FE) MG tablet Take 325 mg by mouth every evening.    ? latanoprost (XALATAN) 0.005 % ophthalmic solution Place 1 drop into both eyes at bedtime.    ? MAGNESIUM PO Take 250 mg by mouth every 14 (fourteen) days.    ? melatonin 5 MG TABS Take 5 mg by mouth at bedtime.    ? timolol (TIMOPTIC) 0.5 % ophthalmic solution INSTILL 1 DROP INTO RIGHT EYE ONCE DAILY (Patient taking differently: Place 1 drop into both eyes 2 (two) times daily. Take Midnight and Noon daily) 5 mL 0  ? VANADIUM PO Take 1 tablet by mouth 2 (two) times a week.    ? VITAMIN A PO Take 1 tablet by mouth once a week.    ? metoprolol succinate (TOPROL XL) 25 MG 24 hr  tablet Take 0.5 tablets (12.5 mg total) by mouth daily. 45 tablet 3  ? amiodarone (PACERONE) 200 MG tablet Take 1 tablet (200 mg total) by mouth daily. (Patient not taking: Reported on 08/19/2021) 60 tablet 1  ? colchicine 0.6 MG tablet Take 1 tablet (0.6 mg total) by mouth daily. (Patient not taking: Reported on 08/13/2021)    ? ?No facility-administered medications prior to visit.  ?  ? ?Allergies:   Patient has no known allergies.  ? ?Social History  ? ?Socioeconomic History  ? Marital status: Married  ?  Spouse name: Not on file  ? Number of children: Not on file  ? Years of education: Not on file  ? Highest education level: Not on  file  ?Occupational History  ? Not on file  ?Tobacco Use  ? Smoking status: Never  ? Smokeless tobacco: Never  ?Vaping Use  ? Vaping Use: Never used  ?Substance and Sexual Activity  ? Alcohol use: No  ? Drug use: No  ? Sexual activity: Not on file  ?Other Topics Concern  ? Not on file  ?Social History Narrative  ? Not on file  ? ?Social Determinants of Health  ? ?Financial Resource Strain: Not on file  ?Food Insecurity: Not on file  ?Transportation Needs: Not on file  ?Physical Activity: Not on file  ?Stress: Not on file  ?Social Connections: Not on file  ?  ? ?Family History:  The patient's family history includes Heart disease in his father; Hypertension in his father.  ? ?Review of Systems:   ? ?Please see the history of present illness.    ? ?All other systems reviewed and are otherwise negative except as noted above. ? ? ?Physical Exam:   ? ?VS:  BP (!) 96/50   Pulse 64   Ht '5\' 10"'$  (1.778 m)   Wt 141 lb 12.8 oz (64.3 kg)   SpO2 97%   BMI 20.35 kg/m?    ?General: Pleasant elderly male appearing in no acute distress. ?Head: Normocephalic, atraumatic. ?Neck: No carotid bruits. JVD not elevated.  ?Lungs: Respirations regular and unlabored, without wheezes or rales.  ?Heart: Regular rate and rhythm. No S3 or S4.  No murmur, no rubs, or gallops appreciated. ?Abdomen: Appears  non-distended. No obvious abdominal masses. ?Msk:  Strength and tone appear normal for age. No obvious joint deformities or effusions. ?Extremities: No clubbing or cyanosis. No pitting edema.  Distal pedal pulses a

## 2021-10-10 ENCOUNTER — Encounter (HOSPITAL_COMMUNITY)
Admission: RE | Admit: 2021-10-10 | Discharge: 2021-10-10 | Disposition: A | Discharge status: Home / Self Care | Payer: Medicare Other | Source: Ambulatory Visit | Attending: Cardiovascular Disease | Admitting: Cardiovascular Disease

## 2021-10-10 DIAGNOSIS — Z951 Presence of aortocoronary bypass graft: Secondary | ICD-10-CM

## 2021-10-10 DIAGNOSIS — I214 Non-ST elevation (NSTEMI) myocardial infarction: Secondary | ICD-10-CM

## 2021-10-10 NOTE — Progress Notes (Signed)
Daily Session Note ? ?Patient Details  ?Name: Adam [name] ?MRN: [id number] ?Date of Birth: January 23, 1941 ?Referring Provider:   ?Flowsheet Row CARDIAC REHAB PHASE II ORIENTATION from 08/20/2021 in [place]  ?Referring Provider Dr. Roxan Hockey  ? ?  ? ? ?Encounter Date: 10/10/2021 ? ?Check In: ? Session Check In - 10/10/21 1430   ? ?  ? Check-In  ? Supervising physician immediately available to respond to emergencies [facility] MD immediately available   ? Physician(s) Dr Harl Bowie   ? Location [facility]   ? Staff Present Geanie Cooley, RN;Debra Wynetta Emery, RN, Joanette Gula, RN, BSN;Christy Edwards, RN, Bjorn Loser, MS, ACSM-CEP, Exercise Physiologist   ? Virtual Visit No   ? Medication changes reported     Yes   ? Comments Toprol d/cd on 10/09/2021 by PCP.   ? Fall or balance concerns reported    No   ? Comments History of 2 falls. Patient complains of feeling dizzy when he gets up too fast.   ? Tobacco Cessation No Change   ? Warm-up and Cool-down Performed as group-led instruction   ? Resistance Training Performed Yes   ? VAD Patient? No   ? PAD/SET Patient? No   ?  ? Pain Assessment  ? Currently in Pain? No/denies   ? Multiple Pain Sites No   ? ?  ?  ? ?  ? ? ?Capillary Blood Glucose: ?No results found for this or any previous visit (from the past 24 hour(s)). ? ? ? ?Social History  ? ?Tobacco Use  ?Smoking Status Never  ?Smokeless Tobacco Never  ? ? ?Goals Met:  ?Independence with exercise equipment ?Exercise tolerated well ?No report of concerns or symptoms today ?Strength training completed today ? ?Goals Unmet:  ?Not Applicable ? ?Comments: Check out at 1545. ? ? ?Dr. Carlyle Dolly is Medical Director for [place] ?

## 2021-10-13 ENCOUNTER — Encounter (HOSPITAL_COMMUNITY)
Admission: RE | Admit: 2021-10-13 | Discharge: 2021-10-13 | Disposition: A | Discharge status: Home / Self Care | Payer: Medicare Other | Source: Ambulatory Visit | Attending: Cardiovascular Disease | Admitting: Cardiovascular Disease

## 2021-10-13 DIAGNOSIS — Z951 Presence of aortocoronary bypass graft: Secondary | ICD-10-CM | POA: Diagnosis not present

## 2021-10-13 DIAGNOSIS — I214 Non-ST elevation (NSTEMI) myocardial infarction: Secondary | ICD-10-CM | POA: Insufficient documentation

## 2021-10-13 NOTE — Progress Notes (Signed)
Daily Session Note ? ?Patient Details  ?Name: Adam [name] ?MRN: [id number] ?Date of Birth: 10/02/1940 ?Referring Provider:   ?Flowsheet Row CARDIAC REHAB PHASE II ORIENTATION from 08/20/2021 in [place]  ?Referring Provider Dr. Roxan Hockey  ? ?  ? ? ?Encounter Date: 10/13/2021 ? ?Check In: ? Session Check In - 10/13/21 1445   ? ?  ? Check-In  ? Supervising physician immediately available to respond to emergencies [facility] MD immediately available   ? Physician(s) Dr Harl Bowie   ? Location [facility]   ? Staff Present Geanie Cooley, RN;Heather Otho Ket, BS, Exercise Physiologist;Dalton Fletcher, MS, ACSM-CEP, Exercise Physiologist   ? Virtual Visit No   ? Medication changes reported     No   ? Fall or balance concerns reported    No   ? Comments History of 2 falls. Patient complains of feeling dizzy when he gets up too fast.   ? Tobacco Cessation No Change   ? Warm-up and Cool-down Performed as group-led instruction   ? Resistance Training Performed Yes   ? VAD Patient? No   ? PAD/SET Patient? No   ?  ? Pain Assessment  ? Currently in Pain? No/denies   ? Multiple Pain Sites No   ? ?  ?  ? ?  ? ? ?Capillary Blood Glucose: ?No results found for this or any previous visit (from the past 24 hour(s)). ? ? ? ?Social History  ? ?Tobacco Use  ?Smoking Status Never  ?Smokeless Tobacco Never  ? ? ?Goals Met:  ?Independence with exercise equipment ?Exercise tolerated well ?No report of concerns or symptoms today ?Strength training completed today ? ?Goals Unmet:  ?Not Applicable ? ?Comments: check out @ 3:45pm ? ? ?Dr. Carlyle Dolly is Medical Director for [place] ?

## 2021-10-15 ENCOUNTER — Encounter (HOSPITAL_COMMUNITY)
Admission: RE | Admit: 2021-10-15 | Discharge: 2021-10-15 | Disposition: A | Discharge status: Home / Self Care | Payer: Medicare Other | Source: Ambulatory Visit | Attending: Cardiovascular Disease | Admitting: Cardiovascular Disease

## 2021-10-15 DIAGNOSIS — Z951 Presence of aortocoronary bypass graft: Secondary | ICD-10-CM

## 2021-10-15 DIAGNOSIS — I214 Non-ST elevation (NSTEMI) myocardial infarction: Secondary | ICD-10-CM

## 2021-10-15 NOTE — Progress Notes (Signed)
Daily Session Note ? ?Patient Details  ?Name: Adam [name] ?MRN: [id number] ?Date of Birth: 11-01-1940 ?Referring Provider:   ?Flowsheet Row CARDIAC REHAB PHASE II ORIENTATION from 08/20/2021 in [place]  ?Referring Provider Dr. Roxan Hockey  ? ?  ? ? ?Encounter Date: 10/15/2021 ? ?Check In: ? Session Check In - 10/15/21 1445   ? ?  ? Check-In  ? Supervising physician immediately available to respond to emergencies [facility] MD immediately available   ? Physician(s) Dr Harl Bowie   ? Location [facility]   ? Staff Present Geanie Cooley, RN;Maizee Reinhold Hassell Done, RN, Bjorn Loser, MS, ACSM-CEP, Exercise Physiologist;Heather Zigmund Daniel, Exercise Physiologist   ? Virtual Visit No   ? Medication changes reported     No   ? Comments Toprol d/cd on 10/09/2021 by PCP.   ? Fall or balance concerns reported    Yes   ? Comments History of 2 falls. Patient complains of feeling dizzy when he gets up too fast.   ? Tobacco Cessation No Change   ? Warm-up and Cool-down Performed as group-led instruction   ? Resistance Training Performed Yes   ? VAD Patient? No   ? PAD/SET Patient? No   ?  ? Pain Assessment  ? Currently in Pain? No/denies   ? Multiple Pain Sites No   ? ?  ?  ? ?  ? ? ?Capillary Blood Glucose: ?No results found for this or any previous visit (from the past 24 hour(s)). ? ? ? ?Social History  ? ?Tobacco Use  ?Smoking Status Never  ?Smokeless Tobacco Never  ? ? ?Goals Met:  ?Independence with exercise equipment ?Exercise tolerated well ?No report of concerns or symptoms today ?Strength training completed today ? ?Goals Unmet:  ?Not Applicable ? ?Comments: Check out at 1545. ? ? ?Dr. Carlyle Dolly is Medical Director for [place] ?

## 2021-10-17 ENCOUNTER — Encounter (HOSPITAL_COMMUNITY)
Admission: RE | Admit: 2021-10-17 | Discharge: 2021-10-17 | Disposition: A | Discharge status: Home / Self Care | Payer: Medicare Other | Source: Ambulatory Visit | Attending: Cardiovascular Disease | Admitting: Cardiovascular Disease

## 2021-10-17 ENCOUNTER — Other Ambulatory Visit: Payer: Self-pay | Admitting: Physician Assistant

## 2021-10-17 DIAGNOSIS — I214 Non-ST elevation (NSTEMI) myocardial infarction: Secondary | ICD-10-CM | POA: Diagnosis not present

## 2021-10-17 DIAGNOSIS — Z951 Presence of aortocoronary bypass graft: Secondary | ICD-10-CM | POA: Diagnosis not present

## 2021-10-17 NOTE — Progress Notes (Signed)
Daily Session Note ? ?Patient Details  ?Name: Adam [name] ?MRN: [id number] ?Date of Birth: 05/19/1941 ?Referring Provider:   ?Flowsheet Row CARDIAC REHAB PHASE II ORIENTATION from 08/20/2021 in [place] [facility]  ?Referring Provider Dr. Hendrickson  ? ?  ? ? ?Encounter Date: 10/17/2021 ? ?Check In: ? Session Check In - 10/17/21 1445   ? ?  ? Check-In  ? Supervising physician immediately available to respond to emergencies [facility] MD immediately available   ? Physician(s) Dr Ross   ? Location [facility]   ? Staff Present Phyllis Billingsley, RN;Heather Jachimiak, BS, Exercise Physiologist;Daphyne Martin, RN, BSN   ? Virtual Visit No   ? Medication changes reported     No   ? Comments Toprol d/cd on 10/09/2021 by PCP.   ? Fall or balance concerns reported    Yes   ? Comments History of 2 falls. Patient complains of feeling dizzy when he gets up too fast.   ? Tobacco Cessation No Change   ? Warm-up and Cool-down Performed as group-led instruction   ? Resistance Training Performed Yes   ? VAD Patient? No   ? PAD/SET Patient? No   ?  ? Pain Assessment  ? Currently in Pain? No/denies   ? Multiple Pain Sites No   ? ?  ?  ? ?  ? ? ?Capillary Blood Glucose: ?No results found for this or any previous visit (from the past 24 hour(s)). ? ? ? ?Social History  ? ?Tobacco Use  ?Smoking Status Never  ?Smokeless Tobacco Never  ? ? ?Goals Met:  ?Independence with exercise equipment ?Exercise tolerated well ?No report of concerns or symptoms today ?Strength training completed today ? ?Goals Unmet:  ?Not Applicable ? ?Comments: Check out at 1545. ? ? ?Dr. Jonathan Branch is Medical Director for [facility] ?

## 2021-10-20 ENCOUNTER — Encounter (HOSPITAL_COMMUNITY)
Admission: RE | Admit: 2021-10-20 | Discharge: 2021-10-20 | Disposition: A | Discharge status: Home / Self Care | Payer: Medicare Other | Source: Ambulatory Visit | Attending: Cardiovascular Disease | Admitting: Cardiovascular Disease

## 2021-10-20 VITALS — Wt 141.5 lb

## 2021-10-20 DIAGNOSIS — I214 Non-ST elevation (NSTEMI) myocardial infarction: Secondary | ICD-10-CM

## 2021-10-20 DIAGNOSIS — Z951 Presence of aortocoronary bypass graft: Secondary | ICD-10-CM

## 2021-10-20 NOTE — Progress Notes (Signed)
Daily Session Note ? ?Patient Details  ?Name: Adam [name] ?MRN: [id number] ?Date of Birth: 04-Apr-1941 ?Referring Provider:   ?Flowsheet Row CARDIAC REHAB PHASE II ORIENTATION from 08/20/2021 in [place]  ?Referring Provider Dr. Roxan Hockey  ? ?  ? ? ?Encounter Date: 10/20/2021 ? ?Check In: ? Session Check In - 10/20/21 1445   ? ?  ? Check-In  ? Supervising physician immediately available to respond to emergencies [facility] MD immediately available   ? Physician(s) Dr Johnsie Cancel   ? Location [facility]   ? Staff Present Geanie Cooley, RN;Heather Otho Ket, BS, Exercise Physiologist;Dalton Kris Mouton, MS, ACSM-CEP, Exercise Physiologist;Nirvi Boehler Hassell Done, RN, BSN   ? Virtual Visit No   ? Medication changes reported     No   ? Comments Toprol d/cd on 10/09/2021 by PCP.   ? Fall or balance concerns reported    Yes   ? Comments History of 2 falls. Patient complains of feeling dizzy when he gets up too fast.   ? Warm-up and Cool-down Performed as group-led instruction   ? Resistance Training Performed Yes   ? VAD Patient? No   ? PAD/SET Patient? No   ?  ? Pain Assessment  ? Currently in Pain? No/denies   ? Multiple Pain Sites No   ? ?  ?  ? ?  ? ? ?Capillary Blood Glucose: ?No results found for this or any previous visit (from the past 24 hour(s)). ? ? ? ?Social History  ? ?Tobacco Use  ?Smoking Status Never  ?Smokeless Tobacco Never  ? ? ?Goals Met:  ?Independence with exercise equipment ?Exercise tolerated well ?No report of concerns or symptoms today ?Strength training completed today ? ?Goals Unmet:  ?Not Applicable ? ?Comments: Checkout at 1045. ? ? ?Dr. Carlyle Dolly is Medical Director for [place] ?

## 2021-10-22 ENCOUNTER — Encounter (HOSPITAL_COMMUNITY)
Admission: RE | Admit: 2021-10-22 | Discharge: 2021-10-22 | Disposition: A | Discharge status: Home / Self Care | Payer: Medicare Other | Source: Ambulatory Visit | Attending: Cardiovascular Disease | Admitting: Cardiovascular Disease

## 2021-10-22 DIAGNOSIS — Z951 Presence of aortocoronary bypass graft: Secondary | ICD-10-CM

## 2021-10-22 DIAGNOSIS — I214 Non-ST elevation (NSTEMI) myocardial infarction: Secondary | ICD-10-CM | POA: Diagnosis not present

## 2021-10-22 NOTE — Progress Notes (Signed)
Daily Session Note ? ?Patient Details  ?Name: Adam [name] ?MRN: [id number] ?Date of Birth: 1940/07/27 ?Referring Provider:   ?Flowsheet Row CARDIAC REHAB PHASE II ORIENTATION from 08/20/2021 in [place]  ?Referring Provider Dr. Roxan Hockey  ? ?  ? ? ?Encounter Date: 10/22/2021 ? ?Check In: ? Session Check In - 10/22/21 1445   ? ?  ? Check-In  ? Supervising physician immediately available to respond to emergencies [facility] MD immediately available   ? Physician(s) Dr Gasper Sells   ? Location [facility]   ? Staff Present Geanie Cooley, RN;Heather Otho Ket, [place], Exercise Physiologist;Tailor Lucking Hassell Done, RN, Bjorn Loser, MS, ACSM-CEP, Exercise Physiologist   ? Virtual Visit No   ? Medication changes reported     No   ? Comments Toprol d/cd on 10/09/2021 by PCP.   ? Tobacco Cessation No Change   ? Warm-up and Cool-down Performed as group-led instruction   ? Resistance Training Performed Yes   ? VAD Patient? No   ? PAD/SET Patient? No   ?  ? Pain Assessment  ? Currently in Pain? No/denies   ? Multiple Pain Sites No   ? ?  ?  ? ?  ? ? ?Capillary Blood Glucose: ?No results found for this or any previous visit (from the past 24 hour(s)). ? ? ? ?Social History  ? ?Tobacco Use  ?Smoking Status Never  ?Smokeless Tobacco Never  ? ? ?Goals Met:  ?Independence with exercise equipment ?Exercise tolerated well ?No report of concerns or symptoms today ?Strength training completed today ? ?Goals Unmet:  ?Not Applicable ? ?Comments: Check out at 1545. ? ? ?Dr. Carlyle Dolly is Medical Director for [place] ?

## 2021-10-24 ENCOUNTER — Encounter (HOSPITAL_COMMUNITY)
Admission: RE | Admit: 2021-10-24 | Discharge: 2021-10-24 | Disposition: A | Discharge status: Home / Self Care | Payer: Medicare Other | Source: Ambulatory Visit | Attending: Cardiovascular Disease | Admitting: Cardiovascular Disease

## 2021-10-24 DIAGNOSIS — I214 Non-ST elevation (NSTEMI) myocardial infarction: Secondary | ICD-10-CM

## 2021-10-24 DIAGNOSIS — Z951 Presence of aortocoronary bypass graft: Secondary | ICD-10-CM

## 2021-10-24 NOTE — Progress Notes (Signed)
Daily Session Note ? ?Patient Details  ?Name: Adam [name] ?MRN: [id number] ?Date of Birth: Sep 26, 1940 ?Referring Provider:   ?Flowsheet Row CARDIAC REHAB PHASE II ORIENTATION from 08/20/2021 in [place]  ?Referring Provider Dr. Roxan Hockey  ? ?  ? ? ?Encounter Date: 10/24/2021 ? ?Check In: ? Session Check In - 10/24/21 1445   ? ?  ? Check-In  ? Supervising physician immediately available to respond to emergencies [facility] MD immediately available   ? Physician(s) Nishan   ? Location [facility]   ? Staff Present Redge Gainer, BS, Exercise Physiologist;Analiah Drum Wynetta Emery, RN, Bjorn Loser, MS, ACSM-CEP, Exercise Physiologist   ? Virtual Visit No   ? Medication changes reported     No   ? Fall or balance concerns reported    Yes   ? Comments History of 2 falls. Patient complains of feeling dizzy when he gets up too fast.   ? Tobacco Cessation No Change   ? Warm-up and Cool-down Performed as group-led instruction   ? Resistance Training Performed Yes   ? VAD Patient? No   ? PAD/SET Patient? No   ?  ? Pain Assessment  ? Currently in Pain? No/denies   ? Multiple Pain Sites No   ? ?  ?  ? ?  ? ? ?Capillary Blood Glucose: ?No results found for this or any previous visit (from the past 24 hour(s)). ? ? ? ?Social History  ? ?Tobacco Use  ?Smoking Status Never  ?Smokeless Tobacco Never  ? ? ?Goals Met:  ?Independence with exercise equipment ?Exercise tolerated well ?No report of concerns or symptoms today ?Strength training completed today ? ?Goals Unmet:  ?Not Applicable ? ?Comments: Check out 1545. ? ? ?Dr. Carlyle Dolly is Medical Director for [place] ?

## 2021-10-27 ENCOUNTER — Encounter (HOSPITAL_COMMUNITY)
Admission: RE | Admit: 2021-10-27 | Discharge: 2021-10-27 | Disposition: A | Discharge status: Home / Self Care | Payer: Medicare Other | Source: Ambulatory Visit | Attending: Cardiovascular Disease | Admitting: Cardiovascular Disease

## 2021-10-27 DIAGNOSIS — I214 Non-ST elevation (NSTEMI) myocardial infarction: Secondary | ICD-10-CM | POA: Diagnosis not present

## 2021-10-27 DIAGNOSIS — Z951 Presence of aortocoronary bypass graft: Secondary | ICD-10-CM

## 2021-10-27 NOTE — Progress Notes (Signed)
Daily Session Note ? ?Patient Details  ?Name: Adam [name] ?MRN: [id number] ?Date of Birth: [birth year] ?Referring Provider:   ?Flowsheet Row CARDIAC REHAB PHASE II ORIENTATION from 08/20/2021 in [place]  ?Referring Provider Dr. Roxan Hockey  ? ?  ? ? ?Encounter Date: 10/27/2021 ? ?Check In: ? Session Check In - 10/27/21 1440   ? ?  ? Check-In  ? Supervising physician immediately available to respond to emergencies [facility] MD immediately available   ? Physician(s) Dr Johnsie Cancel   ? Location [facility]   ? Staff Present Geanie Cooley, RN;Shya Kovatch Hassell Done, RN, Bjorn Loser, MS, ACSM-CEP, Exercise Physiologist;Heather Zigmund Daniel, Exercise Physiologist   ? Virtual Visit No   ? Medication changes reported     No   ? Comments Toprol d/cd on 10/09/2021 by PCP.   ? Fall or balance concerns reported    Yes   ? Comments History of 2 falls. Patient complains of feeling dizzy when he gets up too fast.   ? Tobacco Cessation No Change   ? Warm-up and Cool-down Performed as group-led instruction   ? Resistance Training Performed Yes   ? VAD Patient? No   ? PAD/SET Patient? No   ?  ? Pain Assessment  ? Currently in Pain? No/denies   ? Multiple Pain Sites No   ? ?  ?  ? ?  ? ? ?Capillary Blood Glucose: ?No results found for this or any previous visit (from the past 24 hour(s)). ? ? ? ?Social History  ? ?Tobacco Use  ?Smoking Status Never  ?Smokeless Tobacco Never  ? ? ?Goals Met:  ?Independence with exercise equipment ?Exercise tolerated well ?No report of concerns or symptoms today ?Strength training completed today ? ?Goals Unmet:  ?Not Applicable ? ?Comments: Check out at 1545. ? ? ?Dr. Carlyle Dolly is Medical Director for [place] ?

## 2021-10-29 ENCOUNTER — Encounter (HOSPITAL_COMMUNITY)
Admission: RE | Admit: 2021-10-29 | Discharge: 2021-10-29 | Disposition: A | Discharge status: Home / Self Care | Payer: Medicare Other | Source: Ambulatory Visit | Attending: Cardiovascular Disease | Admitting: Cardiovascular Disease

## 2021-10-29 DIAGNOSIS — I214 Non-ST elevation (NSTEMI) myocardial infarction: Secondary | ICD-10-CM

## 2021-10-29 DIAGNOSIS — Z951 Presence of aortocoronary bypass graft: Secondary | ICD-10-CM | POA: Diagnosis not present

## 2021-10-29 NOTE — Progress Notes (Signed)
Daily Session Note ? ?Patient Details  ?Name: Adam [name] ?MRN: [id number] ?Date of Birth: [birth year] ?Referring Provider:   ?Flowsheet Row CARDIAC REHAB PHASE II ORIENTATION from 08/20/2021 in [place]  ?Referring Provider Dr. Roxan Hockey  ? ?  ? ? ?Encounter Date: 10/29/2021 ? ?Check In: ? Session Check In - 10/29/21 1445   ? ?  ? Check-In  ? Supervising physician immediately available to respond to emergencies [facility] MD immediately available   ? Physician(s) Dr Harl Bowie   ? Location [facility]   ? Staff Present Madelyn Flavors, RN, Bjorn Loser, MS, ACSM-CEP, Exercise Physiologist;Heather Zigmund Daniel, Exercise Physiologist   ? Virtual Visit No   ? Medication changes reported     No   ? Comments Toprol d/cd on 10/09/2021 by PCP.   ? Fall or balance concerns reported    Yes   ? Comments History of 2 falls. Patient complains of feeling dizzy when he gets up too fast.   ? Tobacco Cessation No Change   ? Warm-up and Cool-down Performed as group-led instruction   ? Resistance Training Performed Yes   ? VAD Patient? No   ? PAD/SET Patient? No   ?  ? Pain Assessment  ? Currently in Pain? No/denies   ? Multiple Pain Sites No   ? ?  ?  ? ?  ? ? ?Capillary Blood Glucose: ?No results found for this or any previous visit (from the past 24 hour(s)). ? ? ? ?Social History  ? ?Tobacco Use  ?Smoking Status Never  ?Smokeless Tobacco Never  ? ? ?Goals Met:  ?Independence with exercise equipment ?Exercise tolerated well ?No report of concerns or symptoms today ?Strength training completed today ? ?Goals Unmet:  ?Not Applicable ? ?Comments: Checkout at 66. ? ? ?Dr. Carlyle Dolly is Medical Director for [place] ?

## 2021-10-31 ENCOUNTER — Encounter (HOSPITAL_COMMUNITY)
Admission: RE | Admit: 2021-10-31 | Discharge: 2021-10-31 | Disposition: A | Discharge status: Home / Self Care | Payer: Medicare Other | Source: Ambulatory Visit | Attending: Cardiovascular Disease | Admitting: Cardiovascular Disease

## 2021-10-31 DIAGNOSIS — Z951 Presence of aortocoronary bypass graft: Secondary | ICD-10-CM | POA: Diagnosis not present

## 2021-10-31 DIAGNOSIS — I214 Non-ST elevation (NSTEMI) myocardial infarction: Secondary | ICD-10-CM | POA: Diagnosis not present

## 2021-10-31 NOTE — Progress Notes (Signed)
Daily Session Note ? ?Patient Details  ?Name: Adam [name] ?MRN: [id number] ?Date of Birth: 1940/10/28 ?Referring Provider:   ?Flowsheet Row CARDIAC REHAB PHASE II ORIENTATION from 08/20/2021 in [place]  ?Referring Provider Dr. Roxan Hockey  ? ?  ? ? ?Encounter Date: 10/31/2021 ? ?Check In: ? Session Check In - 10/31/21 1448   ? ?  ? Pain Assessment  ? Multiple Pain Sites No   ? ?  ?  ? ?  ? ? ?Capillary Blood Glucose: ?No results found for this or any previous visit (from the past 24 hour(s)). ? ? ? ?Social History  ? ?Tobacco Use  ?Smoking Status Never  ?Smokeless Tobacco Never  ? ? ?Goals Met:  ?Independence with exercise equipment ?Exercise tolerated well ?No report of concerns or symptoms today ?Strength training completed today ? ?Goals Unmet:  ?Not Applicable ? ?Comments: check out at 1545. ? ? ?Dr. Carlyle Dolly is Medical Director for [place] ?

## 2021-11-03 ENCOUNTER — Encounter (HOSPITAL_COMMUNITY)
Admission: RE | Admit: 2021-11-03 | Discharge: 2021-11-03 | Disposition: A | Discharge status: Home / Self Care | Payer: Medicare Other | Source: Ambulatory Visit | Attending: Cardiovascular Disease | Admitting: Cardiovascular Disease

## 2021-11-03 VITALS — Wt 140.4 lb

## 2021-11-03 DIAGNOSIS — Z951 Presence of aortocoronary bypass graft: Secondary | ICD-10-CM

## 2021-11-03 DIAGNOSIS — I214 Non-ST elevation (NSTEMI) myocardial infarction: Secondary | ICD-10-CM | POA: Diagnosis not present

## 2021-11-03 NOTE — Progress Notes (Signed)
Daily Session Note ? ?Patient Details  ?Name: Adam [name] ?MRN: [id number] ?Date of Birth: [birth year] ?Referring Provider:   ?Flowsheet Row CARDIAC REHAB PHASE II ORIENTATION from 08/20/2021 in [place]  ?Referring Provider Dr. Roxan Hockey  ? ?  ? ? ?Encounter Date: 11/03/2021 ? ?Check In: ? Session Check In - 11/03/21 1445   ? ?  ? Check-In  ? Supervising physician immediately available to respond to emergencies [facility] MD immediately available   ? Physician(s) Dr Gasper Sells   ? Location [facility]   ? Staff Present Madelyn Flavors, RN, Bjorn Loser, MS, ACSM-CEP, Exercise Physiologist;Heather Zigmund Daniel, Exercise Physiologist   ? Virtual Visit No   ? Medication changes reported     No   ? Comments Toprol d/cd on 10/09/2021 by PCP.   ? Tobacco Cessation No Change   ? Warm-up and Cool-down Performed as group-led instruction   ? Resistance Training Performed Yes   ? VAD Patient? No   ? PAD/SET Patient? No   ?  ? Pain Assessment  ? Currently in Pain? No/denies   ? Multiple Pain Sites No   ? ?  ?  ? ?  ? ? ?Capillary Blood Glucose: ?No results found for this or any previous visit (from the past 24 hour(s)). ? ? ? ?Social History  ? ?Tobacco Use  ?Smoking Status Never  ?Smokeless Tobacco Never  ? ? ?Goals Met:  ?Independence with exercise equipment ?Exercise tolerated well ?No report of concerns or symptoms today ?Strength training completed today ? ?Goals Unmet:  ?Not Applicable ? ?Comments: Checkout at 21. ? ? ?Dr. Carlyle Dolly is Medical Director for [place] ?

## 2021-11-05 ENCOUNTER — Encounter (HOSPITAL_COMMUNITY)
Admission: RE | Admit: 2021-11-05 | Discharge: 2021-11-05 | Disposition: A | Discharge status: Home / Self Care | Payer: Medicare Other | Source: Ambulatory Visit | Attending: Cardiovascular Disease | Admitting: Cardiovascular Disease

## 2021-11-05 DIAGNOSIS — I214 Non-ST elevation (NSTEMI) myocardial infarction: Secondary | ICD-10-CM | POA: Diagnosis not present

## 2021-11-05 DIAGNOSIS — Z951 Presence of aortocoronary bypass graft: Secondary | ICD-10-CM

## 2021-11-05 NOTE — Progress Notes (Signed)
Cardiac Individual Treatment Plan ? ?Patient Details  ?Name: Adam [name] ?MRN: [id number] ?Date of Birth: [birth year] ?Referring Provider:   ?Flowsheet Row CARDIAC REHAB PHASE II ORIENTATION from 08/20/2021 in [place]  ?Referring Provider Dr. Roxan Hockey  ? ?  ? ? ?Initial Encounter Date:  ?Flowsheet Row CARDIAC REHAB PHASE II ORIENTATION from 08/20/2021 in [place]  ?Date 08/20/21  ? ?  ? ? ?Visit Diagnosis: NSTEMI (non-ST elevated myocardial infarction) ([place]) ? ?S/P CABG x 5 ? ?Patient's Home Medications on Admission: ? ?Current Outpatient Medications:  ?  aspirin EC 81 MG tablet, Take 81 mg by mouth every evening. Swallow whole., Disp: , Rfl:  ?  atorvastatin (LIPITOR) 80 MG tablet, Take 1 tablet (80 mg total) by mouth daily. (Patient taking differently: Take 80 mg by mouth every evening.), Disp: 30 tablet, Rfl: 3 ?  brimonidine (ALPHAGAN) 0.2 % ophthalmic solution, INSTILL 1 DROP INTO RIGHT EYE TWICE DAILY (Patient taking differently: Place 1 drop into both eyes in the morning and at bedtime. Take at [place]), Disp: 5 mL, Rfl: 0 ?  CHELATED ZINC PO, Take 1 tablet by mouth once a week., Disp: , Rfl:  ?  Cholecalciferol (VITAMIN D3) 50 MCG (2000 UT) TABS, Take 2,000 Units by mouth every evening., Disp: , Rfl:  ?  Chromium 200 MCG CAPS, Take 1 capsule by mouth every evening., Disp: , Rfl:  ?  clopidogrel (PLAVIX) 75 MG tablet, Take 1 tablet (75 mg total) by mouth daily., Disp: 90 tablet, Rfl: 3 ?  Cyanocobalamin (VITAMIN B-12) 2500 MCG SUBL, Place under the tongue every evening., Disp: , Rfl:  ?  ferrous sulfate 325 (65 FE) MG tablet, Take 325 mg by mouth every evening., Disp: , Rfl:  ?  latanoprost (XALATAN) 0.005 % ophthalmic solution, Place 1 drop into both eyes at bedtime., Disp: , Rfl:  ?  MAGNESIUM PO, Take 250 mg by mouth every 14 (fourteen) days., Disp: , Rfl:  ?  melatonin 5 MG TABS, Take 5 mg by mouth at bedtime., Disp: , Rfl:  ?  timolol (TIMOPTIC)  0.5 % ophthalmic solution, INSTILL 1 DROP INTO RIGHT EYE ONCE DAILY (Patient taking differently: Place 1 drop into both eyes 2 (two) times daily. Take Midnight and Noon daily), Disp: 5 mL, Rfl: 0 ?  VANADIUM PO, Take 1 tablet by mouth 2 (two) times a week., Disp: , Rfl:  ?  VITAMIN A PO, Take 1 tablet by mouth once a week., Disp: , Rfl:  ? ?Past Medical History: ?Past Medical History:  ?Diagnosis Date  ? Borderline diabetic   ? DIET CONTROLLED  ? CAD (coronary artery disease)   ? a. s/p NSTEMI in 05/2021 and cath showing multivessel CAD --> s/p CABG on 06/09/2021 with LIMA-LAD, SVG-PDA, seq [id number].  ? History of kidney stones   ? Hyperlipemia   ? Paratesticular mass   ? PONV (postoperative nausea and vomiting)   ? Postoperative atrial fibrillation (HCC)   ? Stroke [facility])   ? Right Eye / VISUAL PROBLEM RESOLVED AFTER ENDARTERECTOMY  ? ? ?Tobacco Use: ?Social History  ? ?Tobacco Use  ?Smoking Status Never  ?Smokeless Tobacco Never  ? ? ?Labs: ?Review Flowsheet   ? ?  ?  Latest Ref Rng & Units 06/05/2021 06/08/2021 06/09/2021  ?Labs for [facility] Cardiac and Pulmonary Rehab  ?Cholestrol 0 - 200 mg/dL 150      ?LDL (calc) 0 - 99 mg/dL 91      ?HDL-C >  40 mg/dL 49      ?Trlycerides <150 mg/dL 48      ?Hemoglobin A1c 4.8 - 5.6 % 6.1      ?PH, Arterial 7.350 - 7.450  7.425   7.332    ? 7.325    ? 7.352    ? 7.444    ? 7.492    ? 7.515    ? 7.521    ? 7.493    ?PCO2 arterial 32.0 - 48.0 mmHg  42.4   40.4    ? 43.6    ? 41.6    ? 39.4    ? 32.7    ? 32.9    ? 33.2    ? 36.0    ?Bicarbonate 20.0 - 28.0 mmol/L  27.3   21.5    ? 23.0    ? 23.4    ? 27.0    ? 25.0    ? 26.6    ? 27.2    ? 27.3    ? 27.7    ?TCO2 22 - 32 mmol/L   23    ? 24    ? 25    ? 26    ? 28    ? 26    ? 26    ? 28    ? 28    ? 26    ? 29    ? 29    ? 28    ? 29    ?Acid-base deficit 0.0 - 2.0 mmol/L   4.0    ? 3.0    ? 2.0    ?O2 Saturation %  97.4   100.0    ? 99.0    ? 99.0    ? 100.0    ? 100.0    ? 100.0    ? 100.0    ? 79.0    ? 100.0    ?  ? ?  Multiple values from one day are sorted in reverse-chronological order  ?  ?  ? ? ?Capillary Blood Glucose: ?Lab Results  ?Component Value Date  ? GLUCAP 103 (H) 08/25/2021  ? GLUCAP 148 (H) 06/12/2021  ? GLUCAP 178 (H) 06/12/2021  ? GLUCAP 179 (H) 06/12/2021  ? GLUCAP 161 (H) 06/11/2021  ? ? ? ?Exercise Target Goals: ?Exercise Program Goal: ?Individual exercise prescription set using results from initial 6 min walk test and THRR while considering  patient?s activity barriers and safety.  ? ?Exercise Prescription Goal: ?Starting with aerobic activity 30 plus minutes a day, 3 days per week for initial exercise prescription. Provide home exercise prescription and guidelines that participant acknowledges understanding prior to discharge. ? ?Activity Barriers & Risk Stratification: ? Activity Barriers & Cardiac Risk Stratification - 08/20/21 1303   ? ?  ? Activity Barriers & Cardiac Risk Stratification  ? Activity Barriers None   ? Cardiac Risk Stratification High   ? ?  ?  ? ?  ? ? ?6 Minute Walk: ? 6 Minute Walk   ? ? [place] Name 08/20/21 1429  ?  ?  ?  ? 6 Minute Walk  ? Phase Initial    ? Distance 1000 feet    ? Walk Time 6 minutes    ? # of Rest Breaks 0    ? MPH 1.9    ? METS 2.15    ? RPE 12    ? VO2 Peak 7.54    ? Symptoms No    ? Resting HR 55  bpm    ? Resting BP 86/50    ? Resting Oxygen Saturation  98 %    ? Exercise Oxygen Saturation  during 6 min walk 98 %    ? Max Ex. HR 65 bpm    ? Max Ex. BP 108/50    ? 2 Minute Post BP 98/50    ? ?  ?  ? ?  ? ? ?Oxygen Initial Assessment: ? ? ?Oxygen Re-Evaluation: ? ? ?Oxygen Discharge (Final Oxygen Re-Evaluation): ? ? ?Initial Exercise Prescription: ? Initial Exercise Prescription - 08/20/21 1400   ? ?  ? Date of Initial Exercise RX and Referring Provider  ? Date 08/20/21   ? Referring Provider Dr. Roxan Hockey   ? Expected Discharge Date 11/07/21   ?  ? NuStep  ? Level 1   ? SPM 60   ? Minutes 17   ?  ? Arm Ergometer  ? Level 1   ? RPM 50   ? Minutes 22   ?  ?  Prescription Details  ? Frequency (times per week) 3   ? Duration Progress to 30 minutes of continuous aerobic without signs/symptoms of physical distress   ?  ? Intensity  ? THRR 40-80% of Max Heartrate 57-114   ? Ratings of Perceived Exertion 11-13   ? Perceived Dyspnea 0-4   ?  ? Resistance Training  ? Training Prescription Yes   ? Weight 3   ? Reps 10-15   ? ?  ?  ? ?  ? ? ?Perform Capillary Blood Glucose checks as needed. ? ?Exercise Prescription Changes: ? ? Exercise Prescription Changes   ? ? [place] Name 08/25/21 1546 09/08/21 1542 09/22/21 1558 10/06/21 1552 10/20/21 1500  ?  ? Response to Exercise  ? Blood Pressure (Admit) 108/78 108/56 86/42 108/52 102/52  ? Blood Pressure (Exercise) 115/60 120/58 112/52 96/48 132/62  ? Blood Pressure (Exit) 98/48 1'06/50 82/50 92/48 '$ 100/58  ? Heart Rate (Admit) 50 bpm 74 bpm 51 bpm 62 bpm 60 bpm  ? Heart Rate (Exercise) 66 bpm 73 bpm 82 bpm 86 bpm 102 bpm  ? Heart Rate (Exit) 57 bpm 65 bpm 60 bpm 67 bpm 69 bpm  ? Rating of Perceived Exertion (Exercise) '12 12 12 12 12  '$ ? Duration Continue with 30 min of aerobic exercise without signs/symptoms of physical distress. Continue with 30 min of aerobic exercise without signs/symptoms of physical distress. Continue with 30 min of aerobic exercise without signs/symptoms of physical distress. Continue with 30 min of aerobic exercise without signs/symptoms of physical distress. Continue with 30 min of aerobic exercise without signs/symptoms of physical distress.  ? Intensity THRR unchanged THRR unchanged THRR unchanged THRR unchanged THRR unchanged  ?  ? Progression  ? Progression Continue to progress workloads to maintain intensity without signs/symptoms of physical distress. Continue to progress workloads to maintain intensity without signs/symptoms of physical distress. Continue to progress workloads to maintain intensity without signs/symptoms of physical distress. Continue to progress workloads to maintain intensity without  signs/symptoms of physical distress. Continue to progress workloads to maintain intensity without signs/symptoms of physical distress.  ?  ? Resistance Training  ? Training Prescription Yes Yes Yes Yes Yes  ? Weight 3 3 3

## 2021-11-05 NOTE — Progress Notes (Signed)
Daily Session Note ? ?Patient Details  ?Name: Adam [name] ?MRN: [id number] ?Date of Birth: 1940/12/26 ?Referring Provider:   ?Flowsheet Row CARDIAC REHAB PHASE II ORIENTATION from 08/20/2021 in [place]  ?Referring Provider Dr. Roxan Hockey  ? ?  ? ? ?Encounter Date: 11/05/2021 ? ?Check In: ? Session Check In - 11/05/21 1445   ? ?  ? Check-In  ? Supervising physician immediately available to respond to emergencies [facility] MD immediately available   ? Physician(s) Dr Domenic Polite   ? Location [facility]   ? Staff Present Aundra Dubin, RN, BSN;Phyllis Billingsley, RN;Shade Kaley Hassell Done, RN, BSN;Heather Otho Ket, BS, Exercise Physiologist   ? Virtual Visit No   ? Medication changes reported     No   ? Comments Toprol d/cd on 10/09/2021 by PCP.   ? Fall or balance concerns reported    No   ? Comments History of 2 falls. Patient complains of feeling dizzy when he gets up too fast.   ? Tobacco Cessation No Change   ? Warm-up and Cool-down Performed as group-led instruction   ? Resistance Training Performed Yes   ? VAD Patient? No   ? PAD/SET Patient? No   ?  ? Pain Assessment  ? Currently in Pain? No/denies   ? Multiple Pain Sites No   ? ?  ?  ? ?  ? ? ?Capillary Blood Glucose: ?No results found for this or any previous visit (from the past 24 hour(s)). ? ? ? ?Social History  ? ?Tobacco Use  ?Smoking Status Never  ?Smokeless Tobacco Never  ? ? ?Goals Met:  ?Proper associated with RPD/PD & O2 Sat ?Changing diet to healthy choices, watching portion sizes ?No report of concerns or symptoms today ?Strength training completed today ? ?Goals Unmet:  ?Not Applicable ? ?Comments: Check out 1545. ? ? ?Dr. Carlyle Dolly is Medical Director for [place] ?

## 2021-11-07 ENCOUNTER — Encounter (HOSPITAL_COMMUNITY)
Admission: RE | Admit: 2021-11-07 | Discharge: 2021-11-07 | Disposition: A | Discharge status: Home / Self Care | Payer: Medicare Other | Source: Ambulatory Visit | Attending: Cardiovascular Disease | Admitting: Cardiovascular Disease

## 2021-11-07 DIAGNOSIS — I214 Non-ST elevation (NSTEMI) myocardial infarction: Secondary | ICD-10-CM | POA: Diagnosis not present

## 2021-11-07 DIAGNOSIS — R946 Abnormal results of thyroid function studies: Secondary | ICD-10-CM | POA: Diagnosis not present

## 2021-11-07 DIAGNOSIS — D509 Iron deficiency anemia, unspecified: Secondary | ICD-10-CM | POA: Diagnosis not present

## 2021-11-07 DIAGNOSIS — Z951 Presence of aortocoronary bypass graft: Secondary | ICD-10-CM

## 2021-11-07 DIAGNOSIS — E782 Mixed hyperlipidemia: Secondary | ICD-10-CM | POA: Diagnosis not present

## 2021-11-07 DIAGNOSIS — E118 Type 2 diabetes mellitus with unspecified complications: Secondary | ICD-10-CM | POA: Diagnosis not present

## 2021-11-07 NOTE — Progress Notes (Signed)
Daily Session Note ? ?Patient Details  ?Name: Adam [name] ?MRN: [id number] ?Date of Birth: 24-Mar-1941 ?Referring Provider:   ?Flowsheet Row CARDIAC REHAB PHASE II ORIENTATION from 08/20/2021 in [place]  ?Referring Provider Dr. Roxan Hockey  ? ?  ? ? ?Encounter Date: 11/07/2021 ? ?Check In: ? Session Check In - 11/07/21 1445   ? ?  ? Check-In  ? Supervising physician immediately available to respond to emergencies [facility] MD immediately available   ? Physician(s) Dr Domenic Polite   ? Location [facility]   ? Staff Present Madelyn Flavors, RN, Bjorn Loser, MS, ACSM-CEP, Exercise Physiologist;Heather Zigmund Daniel, Exercise Physiologist   ? Virtual Visit No   ? Medication changes reported     No   ? Comments Toprol d/cd on 10/09/2021 by PCP.   ? Fall or balance concerns reported    No   ? Comments History of 2 falls. Patient complains of feeling dizzy when he gets up too fast.   ? Tobacco Cessation No Change   ? Warm-up and Cool-down Performed as group-led instruction   ? Resistance Training Performed Yes   ? VAD Patient? No   ? PAD/SET Patient? No   ?  ? Pain Assessment  ? Currently in Pain? No/denies   ? Multiple Pain Sites No   ? ?  ?  ? ?  ? ? ?Capillary Blood Glucose: ?No results found for this or any previous visit (from the past 24 hour(s)). ? ? ? ?Social History  ? ?Tobacco Use  ?Smoking Status Never  ?Smokeless Tobacco Never  ? ? ?Goals Met:  ?Independence with exercise equipment ?Exercise tolerated well ?No report of concerns or symptoms today ?Strength training completed today ? ?Goals Unmet:  ?Not Applicable ? ?Comments: Check out at 1545. ? ? ?Dr. Carlyle Dolly is Medical Director for [place] ?

## 2021-11-10 ENCOUNTER — Encounter (HOSPITAL_COMMUNITY)
Admission: RE | Admit: 2021-11-10 | Discharge: 2021-11-10 | Disposition: A | Discharge status: Home / Self Care | Payer: Medicare Other | Source: Ambulatory Visit | Attending: Cardiovascular Disease | Admitting: Cardiovascular Disease

## 2021-11-10 VITALS — Ht 71.0 in | Wt 140.2 lb

## 2021-11-10 DIAGNOSIS — I214 Non-ST elevation (NSTEMI) myocardial infarction: Secondary | ICD-10-CM | POA: Diagnosis not present

## 2021-11-10 DIAGNOSIS — Z951 Presence of aortocoronary bypass graft: Secondary | ICD-10-CM | POA: Insufficient documentation

## 2021-11-10 NOTE — Progress Notes (Signed)
I have reviewed a Home Exercise Prescription with Adam [name] . [name] is not currently exercising at home.  The patient was advised to walk 5 days a week for 30-45 minutes.  Jori Moll and I discussed how to progress their exercise prescription.  The patient stated that their goals were build back his strength.  The patient stated that they understand the exercise prescription.  We reviewed exercise guidelines, target heart rate during exercise, RPE Scale, weather conditions, NTG use, endpoints for exercise, warmup and cool down.  Patient is encouraged to come to me with any questions. I will continue to follow up with the patient to assist them with progression and safety.    ?

## 2021-11-10 NOTE — Progress Notes (Signed)
Daily Session Note ? ?Patient Details  ?Name: Adam [name] ?MRN: [id number] ?Date of Birth: [birth year] ?Referring Provider:   ?Flowsheet Row CARDIAC REHAB PHASE II ORIENTATION from 08/20/2021 in [place]  ?Referring Provider Dr. Roxan Hockey  ? ?  ? ? ?Encounter Date: 11/10/2021 ? ?Check In: ? Session Check In - 11/10/21 1444   ? ?  ? Check-In  ? Supervising physician immediately available to respond to emergencies [facility] MD immediately available   ? Physician(s) Dr Harl Bowie   ? Location [facility]   ? Staff Present Aundra Dubin, RN, Joanette Gula, RN, Bjorn Loser, MS, ACSM-CEP, Exercise Physiologist;Heather Zigmund Daniel, Exercise Physiologist   ? Virtual Visit No   ? Medication changes reported     No   ? Comments Toprol d/cd on 10/09/2021 by PCP.   ? Tobacco Cessation No Change   ? Warm-up and Cool-down Performed as group-led instruction   ? Resistance Training Performed Yes   ? VAD Patient? No   ? PAD/SET Patient? No   ?  ? Pain Assessment  ? Currently in Pain? No/denies   ? Multiple Pain Sites No   ? ?  ?  ? ?  ? ? ?Capillary Blood Glucose: ?No results found for this or any previous visit (from the past 24 hour(s)). ? ? ? ?Social History  ? ?Tobacco Use  ?Smoking Status Never  ?Smokeless Tobacco Never  ? ? ?Goals Met:  ?Independence with exercise equipment ?Exercise tolerated well ?No report of concerns or symptoms today ?Strength training completed today ? ?Goals Unmet:  ?Not Applicable ? ?Comments: Check out at 1545. ? ? ?Dr. Carlyle Dolly is Medical Director for [place] ?

## 2021-11-11 DIAGNOSIS — E1169 Type 2 diabetes mellitus with other specified complication: Secondary | ICD-10-CM | POA: Diagnosis not present

## 2021-11-11 DIAGNOSIS — R946 Abnormal results of thyroid function studies: Secondary | ICD-10-CM | POA: Diagnosis not present

## 2021-11-11 DIAGNOSIS — I480 Paroxysmal atrial fibrillation: Secondary | ICD-10-CM | POA: Diagnosis not present

## 2021-11-11 DIAGNOSIS — Z951 Presence of aortocoronary bypass graft: Secondary | ICD-10-CM | POA: Diagnosis not present

## 2021-11-11 DIAGNOSIS — I25810 Atherosclerosis of coronary artery bypass graft(s) without angina pectoris: Secondary | ICD-10-CM | POA: Diagnosis not present

## 2021-11-11 DIAGNOSIS — E782 Mixed hyperlipidemia: Secondary | ICD-10-CM | POA: Diagnosis not present

## 2021-11-11 DIAGNOSIS — Z634 Disappearance and death of family member: Secondary | ICD-10-CM | POA: Diagnosis not present

## 2021-11-11 DIAGNOSIS — D509 Iron deficiency anemia, unspecified: Secondary | ICD-10-CM | POA: Diagnosis not present

## 2021-11-11 NOTE — Progress Notes (Signed)
Discharge Progress Report ? ?Patient Details  ?Name: Adam [name] ?MRN: [id number] ?Date of Birth: [birth year] ?Referring Provider:   ?Flowsheet Row CARDIAC REHAB PHASE II ORIENTATION from 08/20/2021 in [place]  ?Referring Provider Dr. Roxan Hockey  ? ?  ? ? ? ?Number of Visits: 31 ? ?Reason for Discharge:  ?Patient reached a stable level of exercise. ?Patient independent in their exercise. ?Patient has met program and personal goals. ? ?Smoking History:  ?Social History  ? ?Tobacco Use  ?Smoking Status Never  ?Smokeless Tobacco Never  ? ? ?Diagnosis:  ?NSTEMI (non-ST elevated myocardial infarction) ([place]) ? ?S/P CABG x 5 ? ?ADL [facility]: ? ? ?Initial Exercise Prescription: ? Initial Exercise Prescription - 08/20/21 1400   ? ?  ? Date of Initial Exercise RX and Referring Provider  ? Date 08/20/21   ? Referring Provider Dr. Roxan Hockey   ? Expected Discharge Date 11/07/21   ?  ? NuStep  ? Level 1   ? SPM 60   ? Minutes 17   ?  ? Arm Ergometer  ? Level 1   ? RPM 50   ? Minutes 22   ?  ? Prescription Details  ? Frequency (times per week) 3   ? Duration Progress to 30 minutes of continuous aerobic without signs/symptoms of physical distress   ?  ? Intensity  ? THRR 40-80% of Max Heartrate 57-114   ? Ratings of Perceived Exertion 11-13   ? Perceived Dyspnea 0-4   ?  ? Resistance Training  ? Training Prescription Yes   ? Weight 3   ? Reps 10-15   ? ?  ?  ? ?  ? ? ?Discharge Exercise Prescription (Final Exercise Prescription Changes): ? Exercise Prescription Changes - 11/10/21 1500   ? ?  ? Home Exercise Plan  ? Plans to continue exercise at Home (comment)   ? Frequency Add 2 additional days to program exercise sessions.   ? Initial Home Exercises Provided 11/10/21   ? ?  ?  ? ?  ? ? ?Functional Capacity: ? 6 Minute Walk   ? ? [place] Name 08/20/21 1429 11/10/21 1509  ?  ?  ? 6 Minute Walk  ? Phase Initial Discharge   ? Distance 1000 feet 1200 feet   ? Distance Feet Change -- 200 ft   ? Walk Time 6 minutes 6  minutes   ? # of Rest Breaks 0 0   ? MPH 1.9 2.27   ? METS 2.15 2.81   ? RPE 12 12   ? VO2 Peak 7.54 9.85   ? Symptoms No No   ? Resting HR 55 bpm 60 bpm   ? Resting BP 86/50 96/56   ? Resting Oxygen Saturation  98 % 99 %   ? Exercise Oxygen Saturation  during 6 min walk 98 % 97 %   ? Max Ex. HR 65 bpm 81 bpm   ? Max Ex. BP 108/50 120/54   ? 2 Minute Post BP 98/50 110/58   ? ?  ?  ? ?  ? ? ?Psychological, QOL, Others - Outcomes: ?PHQ 2/9: ? ?  11/11/2021  ?  2:34 PM 08/20/2021  ?  1:53 PM  ?Depression screen PHQ 2/9  ?Decreased Interest 0 0  ?Down, Depressed, Hopeless 0 0  ?PHQ - 2 Score 0 0  ?Altered sleeping 0 0  ?Tired, decreased energy 0 2  ?Change in appetite 0 3  ?Feeling bad or failure about yourself  0 0  ?Trouble concentrating 0 0  ?Moving slowly or fidgety/restless 0 0  ?Suicidal thoughts 0 0  ?PHQ-9 Score 0 5  ?Difficult doing work/chores Not difficult at all Somewhat difficult  ? ? ?Quality of Life: ? Quality of Life - 11/10/21 1511   ? ?  ? Quality of Life  ? Select Quality of Life   ?  ? Quality of Life Scores  ? Health/Function Pre 21.85 %   ? Health/Function Post 27.3 %   ? Health/Function % Change 24.94 %   ? Socioeconomic Pre 25.64 %   ? Socioeconomic Post 29.14 %   ? Socioeconomic % Change  13.65 %   ? Psych/Spiritual Pre 28.93 %   ? Psych/Spiritual Post 27.86 %   ? Psych/Spiritual % Change -3.7 %   ? Family Pre 26.4 %   ? Family Post 25.1 %   ? Family % Change -4.92 %   ? GLOBAL Pre 24.94 %   ? GLOBAL Post 27.47 %   ? GLOBAL % Change 10.14 %   ? ?  ?  ? ?  ? ? ?Personal Goals: ?Goals established at orientation with interventions provided to work toward goal. ? Personal Goals and Risk Factors at Admission - 08/20/21 1413   ? ?  ? Core Components/Risk Factors/Patient Goals on Admission  ?  Weight Management Weight Maintenance   ? Lipids Yes   ? Intervention Provide education and support for participant on nutrition & aerobic/resistive exercise along with prescribed medications to achieve LDL <33m, HDL  >475m   ? Expected Outcomes Short Term: Participant states understanding of desired cholesterol values and is compliant with medications prescribed. Participant is following exercise prescription and nutrition guidelines.;Long Term: Cholesterol controlled with medications as prescribed, with individualized exercise RX and with personalized nutrition plan. Value goals: LDL < 7023mHDL > 40 mg.   ? Personal Goal Other Yes   ? Personal Goal Patient wants to improve his strength and stamina and mobility. He wants to be able to continue and care for his wife and do his home management task.   ? Intervention Patient will attend CR 3 days/week with exercise and education and supplement with exercise at home.   ? Expected Outcomes Patient will meet both program and personal goals.   ? ?  ?  ? ?  ?  ? ?Personal Goals Discharge: ? Goals and Risk Factor Review   ? ? [place] 09[place]21 1508 10/01/21 0722 10/27/21 1331 11/11/21 1446  ?  ?  ? Core Components/Risk Factors/Patient Goals Review  ? Personal Goals Review Weight Management/Obesity;Lipids;Other Weight Management/Obesity;Lipids;Other Weight Management/Obesity;Lipids;Other Weight Management/Obesity;Lipids;Other   ? Review Patient was referred to CR with NSTEMI/CABGx5. He has multiple risk factors for CAD and is participating in the program for risk modification. He current weight is 133.2 lbs up 2 lbs from his intial weight. He has completed 3 sessions. His blood pressure is well controlled and soft at times. His personal goals for the program are to increase his strength and stamina and mobility and be able to continue to care for his wife and do his ADL's. We will continue to monitor his progress as he works towards meeting these goals. Patient has completed 12 sessions. His current weight is 137.0 lbs up 5 lbs from last 30 day review. He is doing well in the program with consistent attendance and progressions. His blood pressures continues to be well controlled and  soft at times. Patient walked in to cardiology  office 3/15 concerned about his low heart rate stating he stays in the 40's. Patient's average heart rate in cardiac is 60 bpm. He questions why he was taking metoprolol. Bernerd Pho, PA-C contacted patient and reduced his metoprolol to 12.5 mg daily and explained why he needed to be on a beta-blocker. His personal goals for the program continue to be to increase his strength and stamina and mobility and be able to continue to care for his wife and do his ADL's. We will continue to monitor his progress as he works towards meeting these goals. Patient has completed 24 sessions. His current weight is 141.2  lbs up 4.2 lbs from last 30 day review. He is doing well in the program with consistent attendance and progressions. His blood pressures continues to be well controlled and soft at times.  His personal goals for the program continue to be to increase his strength and stamina and mobility.  We will continue to monitor his progress as he works towards meeting these goals. Pt graduated after 31 sessions of CR. He gained 9.1 lbs while in the program. His blood pressures were soft at times and he was often lethargic. He commonly would fall asleep on the machines while he was exercising. Cardiology took him off of his blood pressure medications due to this. He was able to increase his strength and stamina. He was able to improve on his walk test distance by 20%.   ? Expected Outcomes Patient will complete the program meeting both personal and program goals. Patient will complete the program meeting both personal and program goals. Patient will complete the program meeting both personal and program goals. Pt will continue to work towards their personal goals post discharge.   ? ?  ?  ? ?  ? ? ?Exercise Goals and Review: ? Exercise Goals   ? ? [place] Name 08/20/21 1432 09/08/21 1544 10/06/21 1552 11/03/21 1530  ?  ?  ? Exercise Goals  ? Increase Physical Activity Yes Yes Yes  Yes   ? Intervention Provide advice, education, support and counseling about physical activity/exercise needs.;Develop an individualized exercise prescription for aerobic and resistive training based on

## 2021-12-01 DIAGNOSIS — H401121 Primary open-angle glaucoma, left eye, mild stage: Secondary | ICD-10-CM | POA: Diagnosis not present

## 2021-12-01 DIAGNOSIS — Z961 Presence of intraocular lens: Secondary | ICD-10-CM | POA: Diagnosis not present

## 2021-12-01 DIAGNOSIS — H401113 Primary open-angle glaucoma, right eye, severe stage: Secondary | ICD-10-CM | POA: Diagnosis not present

## 2021-12-01 DIAGNOSIS — H31013 Macula scars of posterior pole (postinflammatory) (post-traumatic), bilateral: Secondary | ICD-10-CM | POA: Diagnosis not present

## 2022-02-05 DIAGNOSIS — D509 Iron deficiency anemia, unspecified: Secondary | ICD-10-CM | POA: Diagnosis not present

## 2022-02-05 DIAGNOSIS — E782 Mixed hyperlipidemia: Secondary | ICD-10-CM | POA: Diagnosis not present

## 2022-02-05 DIAGNOSIS — R7301 Impaired fasting glucose: Secondary | ICD-10-CM | POA: Diagnosis not present

## 2022-02-12 ENCOUNTER — Encounter: Payer: Self-pay | Admitting: Internal Medicine

## 2022-02-12 DIAGNOSIS — I25810 Atherosclerosis of coronary artery bypass graft(s) without angina pectoris: Secondary | ICD-10-CM | POA: Diagnosis not present

## 2022-02-12 DIAGNOSIS — Z681 Body mass index (BMI) 19 or less, adult: Secondary | ICD-10-CM | POA: Diagnosis not present

## 2022-02-12 DIAGNOSIS — R946 Abnormal results of thyroid function studies: Secondary | ICD-10-CM | POA: Diagnosis not present

## 2022-02-12 DIAGNOSIS — Z951 Presence of aortocoronary bypass graft: Secondary | ICD-10-CM | POA: Diagnosis not present

## 2022-02-12 DIAGNOSIS — I480 Paroxysmal atrial fibrillation: Secondary | ICD-10-CM | POA: Diagnosis not present

## 2022-02-12 DIAGNOSIS — D509 Iron deficiency anemia, unspecified: Secondary | ICD-10-CM | POA: Diagnosis not present

## 2022-02-12 DIAGNOSIS — E782 Mixed hyperlipidemia: Secondary | ICD-10-CM | POA: Diagnosis not present

## 2022-02-12 DIAGNOSIS — E1169 Type 2 diabetes mellitus with other specified complication: Secondary | ICD-10-CM | POA: Diagnosis not present

## 2022-03-31 DIAGNOSIS — H31013 Macula scars of posterior pole (postinflammatory) (post-traumatic), bilateral: Secondary | ICD-10-CM | POA: Diagnosis not present

## 2022-03-31 DIAGNOSIS — H401121 Primary open-angle glaucoma, left eye, mild stage: Secondary | ICD-10-CM | POA: Diagnosis not present

## 2022-03-31 DIAGNOSIS — E119 Type 2 diabetes mellitus without complications: Secondary | ICD-10-CM | POA: Diagnosis not present

## 2022-03-31 DIAGNOSIS — H401113 Primary open-angle glaucoma, right eye, severe stage: Secondary | ICD-10-CM | POA: Diagnosis not present

## 2022-03-31 DIAGNOSIS — H26492 Other secondary cataract, left eye: Secondary | ICD-10-CM | POA: Diagnosis not present

## 2022-03-31 DIAGNOSIS — Z961 Presence of intraocular lens: Secondary | ICD-10-CM | POA: Diagnosis not present

## 2022-05-11 DIAGNOSIS — C4441 Basal cell carcinoma of skin of scalp and neck: Secondary | ICD-10-CM | POA: Diagnosis not present

## 2022-05-20 IMAGING — CR DG CHEST 2V
2 series · 2 of 2 positions shown · non-contrast
Comparison: 06/11/2021

CLINICAL DATA: Post CABG

EXAM:
CHEST - 2 VIEW

[chest lat]
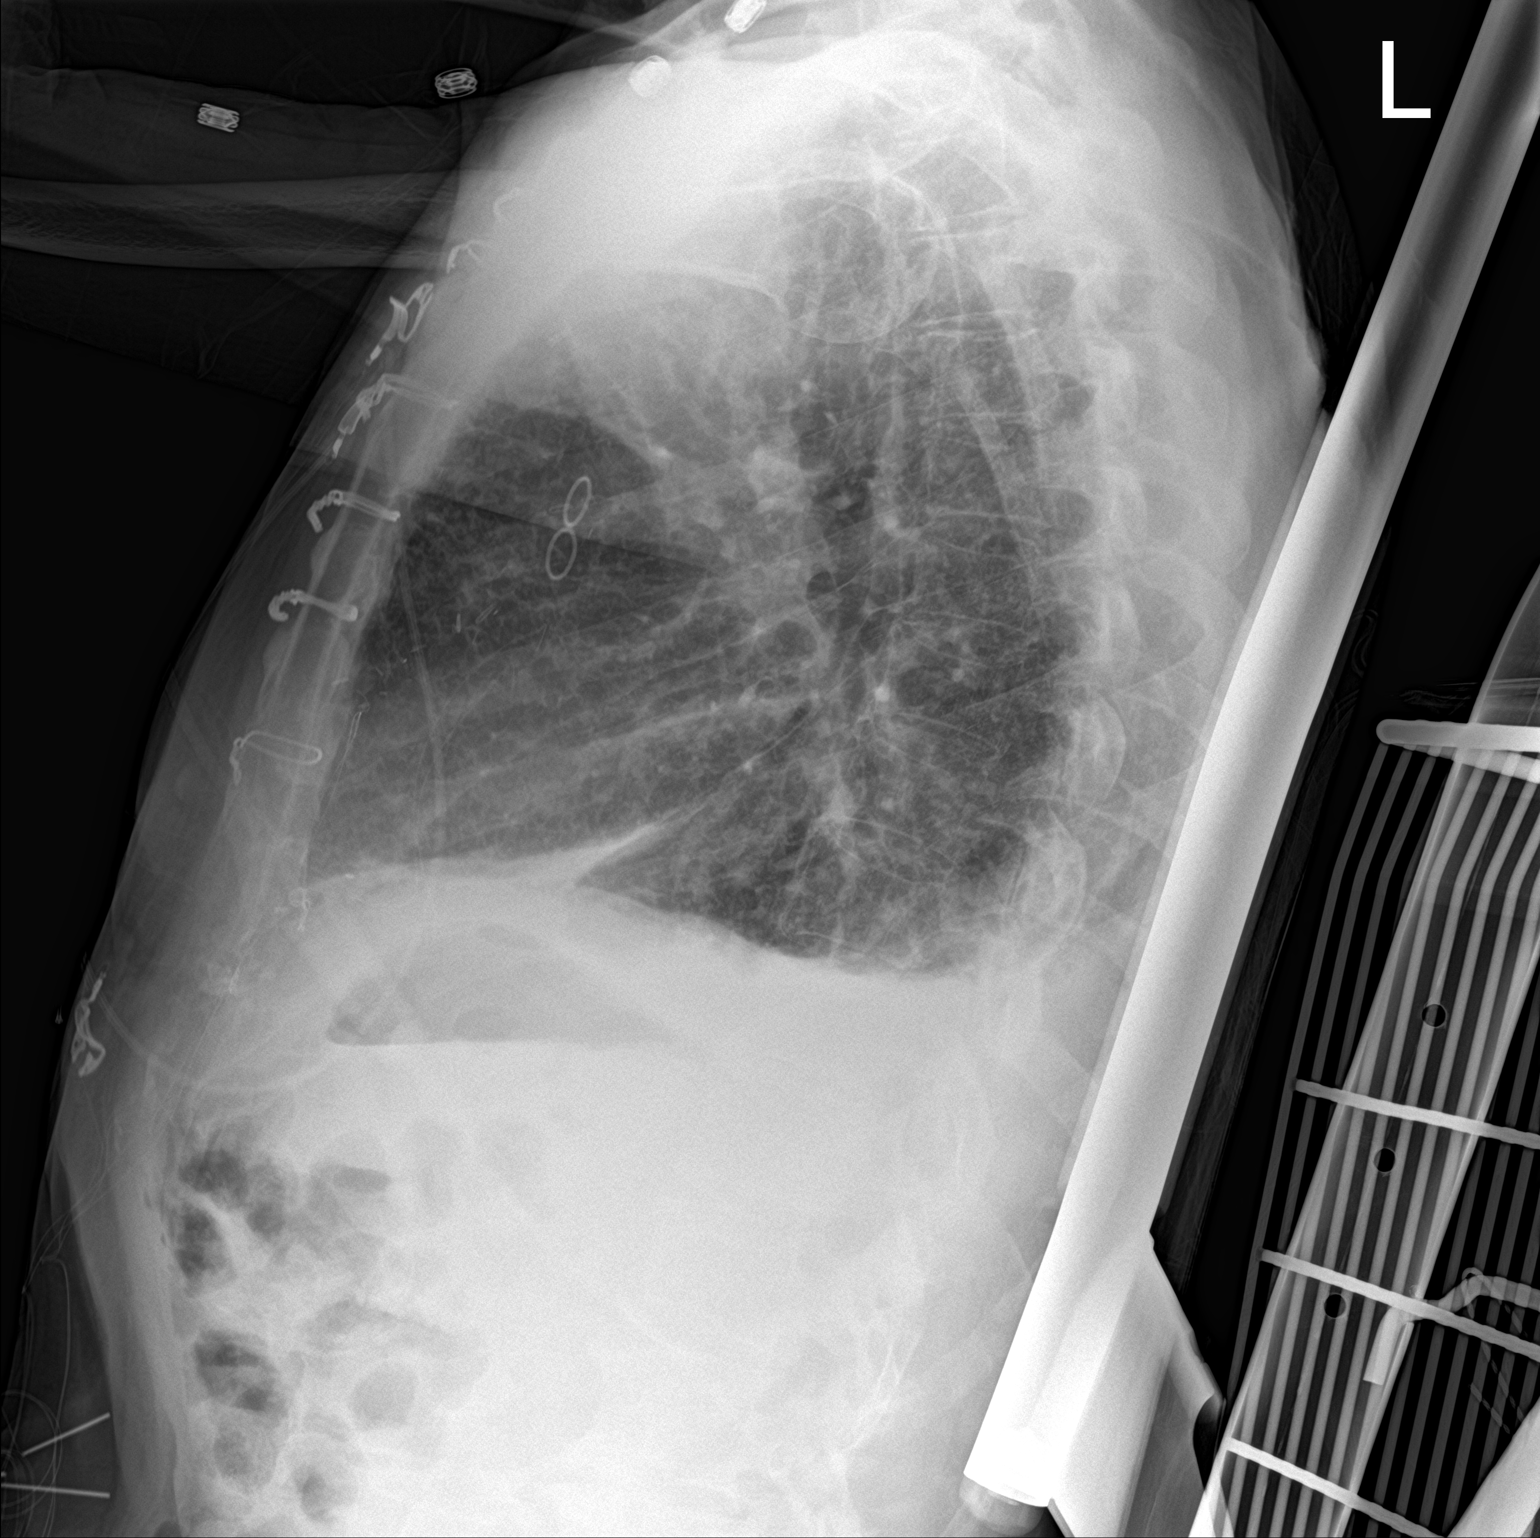

[chest ap]
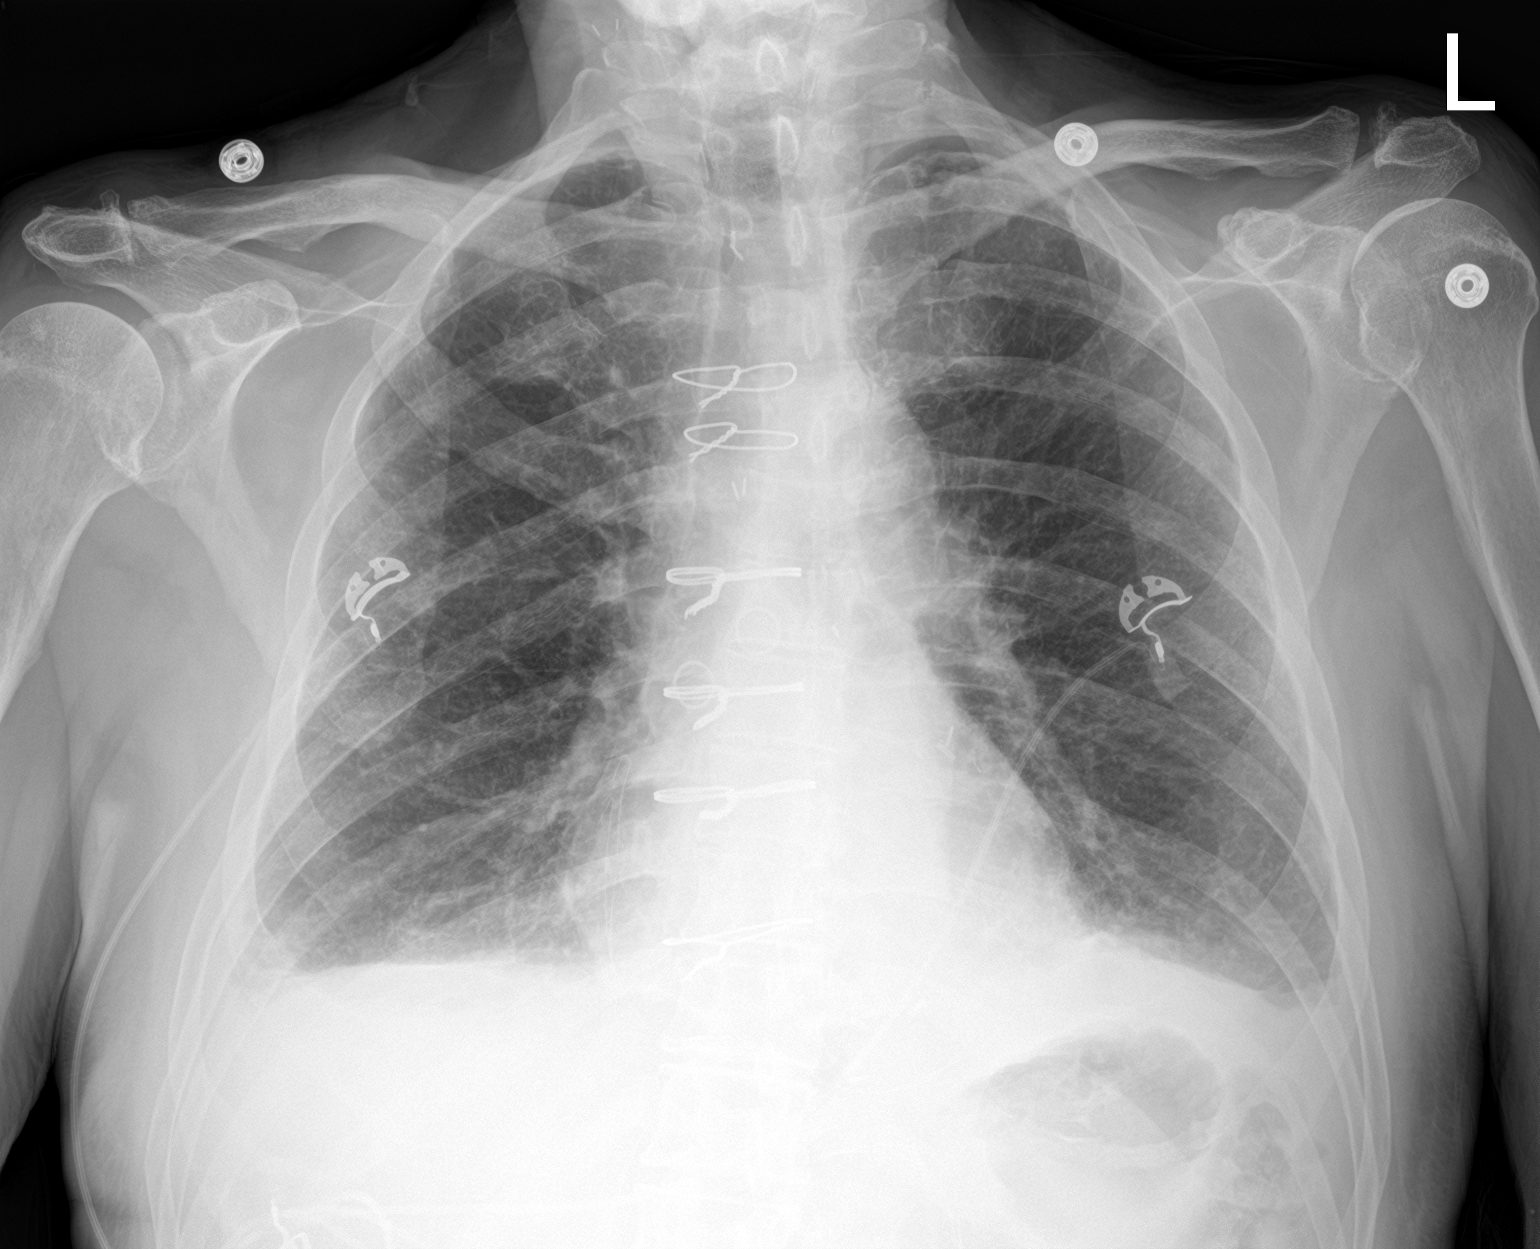

[2 of 2 positions shown; findings below may reference images not displayed]

FINDINGS: Decreased edema, pleural effusions, and bibasilar atelectasis.
Possible trace left apical pneumothorax remains present. Stable
cardiomediastinal contours. Post CABG.
IMPRESSION: Improved lung aeration.  Possible trace left pneumothorax remains.

## 2022-05-25 DIAGNOSIS — C4441 Basal cell carcinoma of skin of scalp and neck: Secondary | ICD-10-CM | POA: Diagnosis not present

## 2022-06-01 DIAGNOSIS — C4441 Basal cell carcinoma of skin of scalp and neck: Secondary | ICD-10-CM | POA: Diagnosis not present

## 2022-06-16 DIAGNOSIS — C4441 Basal cell carcinoma of skin of scalp and neck: Secondary | ICD-10-CM | POA: Diagnosis not present

## 2022-06-22 DIAGNOSIS — D509 Iron deficiency anemia, unspecified: Secondary | ICD-10-CM | POA: Diagnosis not present

## 2022-06-22 DIAGNOSIS — R946 Abnormal results of thyroid function studies: Secondary | ICD-10-CM | POA: Diagnosis not present

## 2022-06-22 DIAGNOSIS — E1169 Type 2 diabetes mellitus with other specified complication: Secondary | ICD-10-CM | POA: Diagnosis not present

## 2022-06-22 DIAGNOSIS — E782 Mixed hyperlipidemia: Secondary | ICD-10-CM | POA: Diagnosis not present

## 2022-06-22 IMAGING — CR DG CHEST 2V
2 series · 2 of 2 positions shown · non-contrast
Comparison: 06/12/2021

CLINICAL DATA: Post CABG

EXAM:
CHEST - 2 VIEW

[w chest pa]
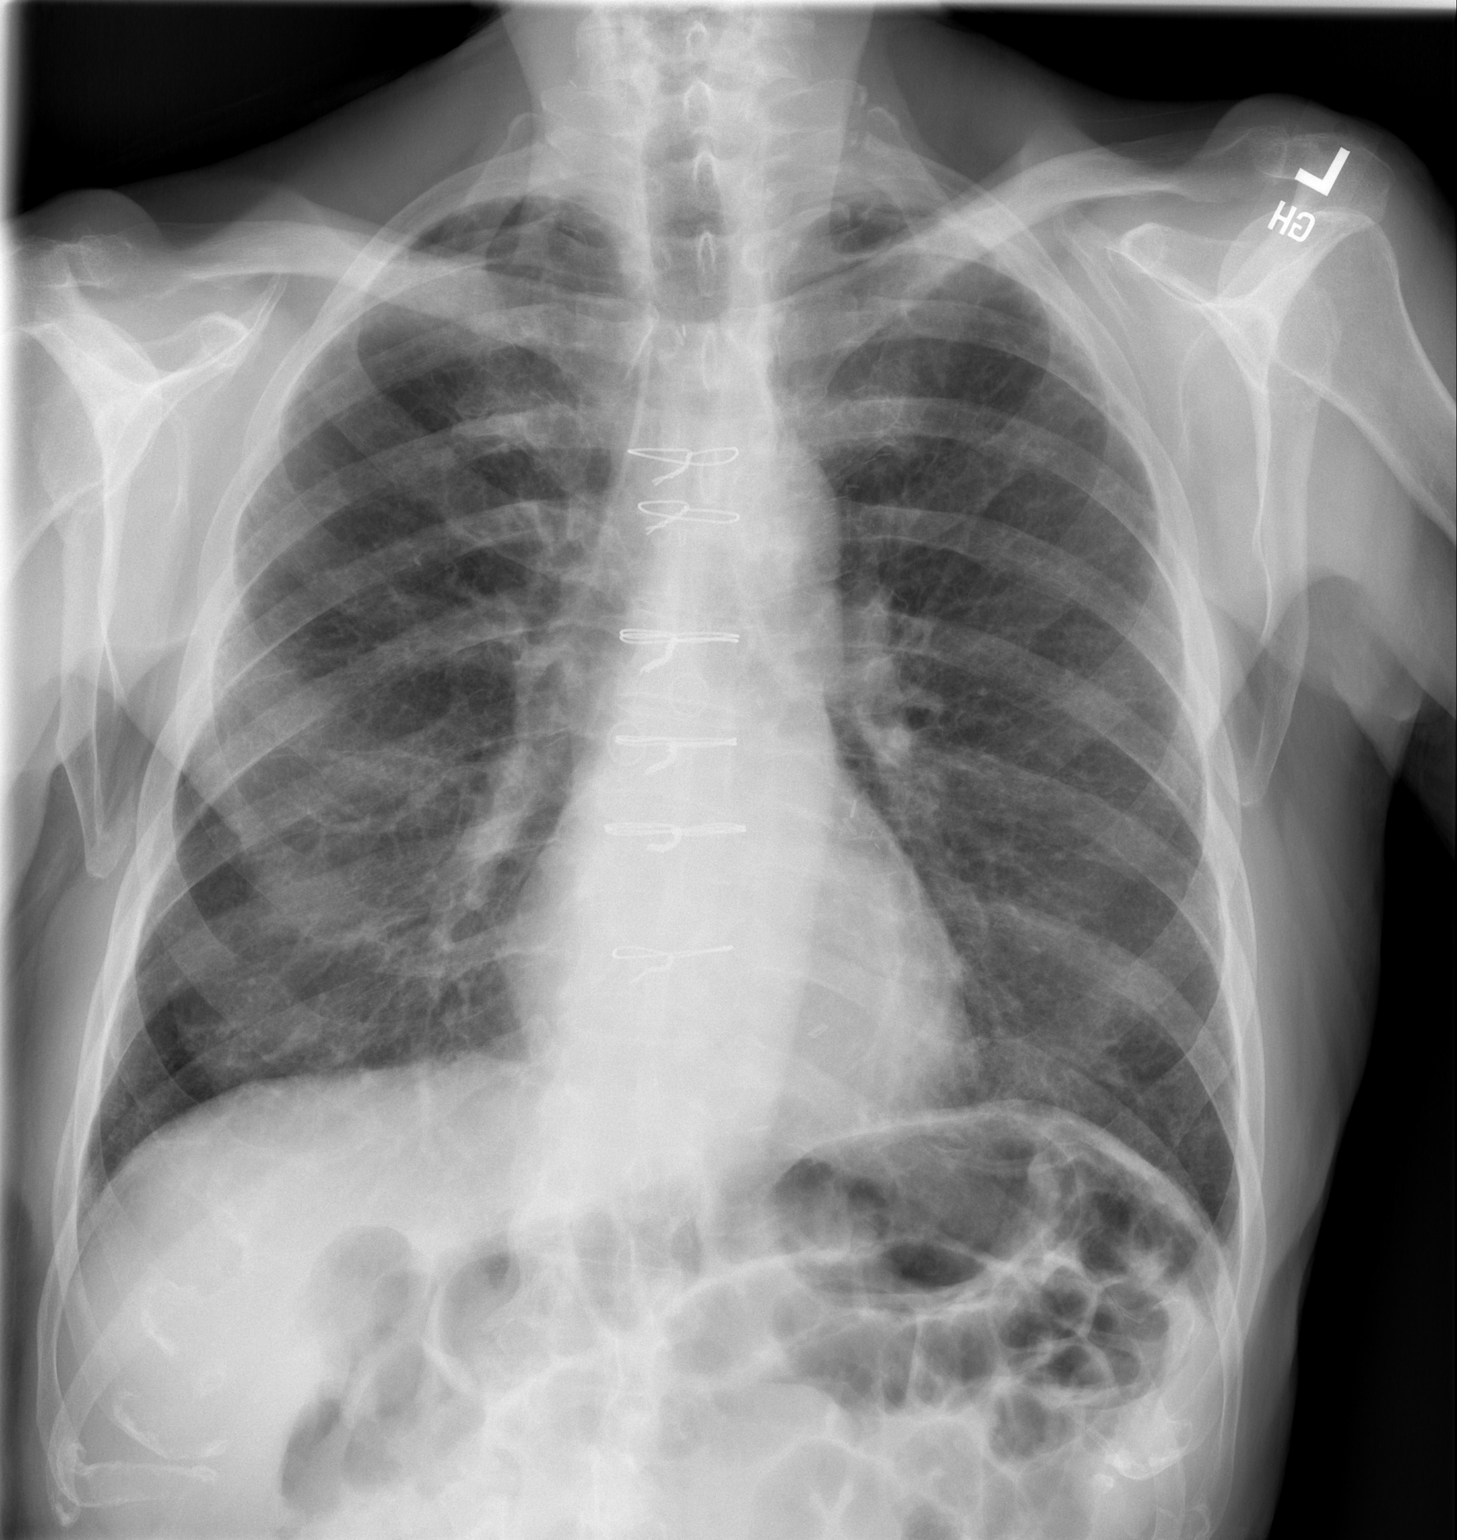

[w chest lat]
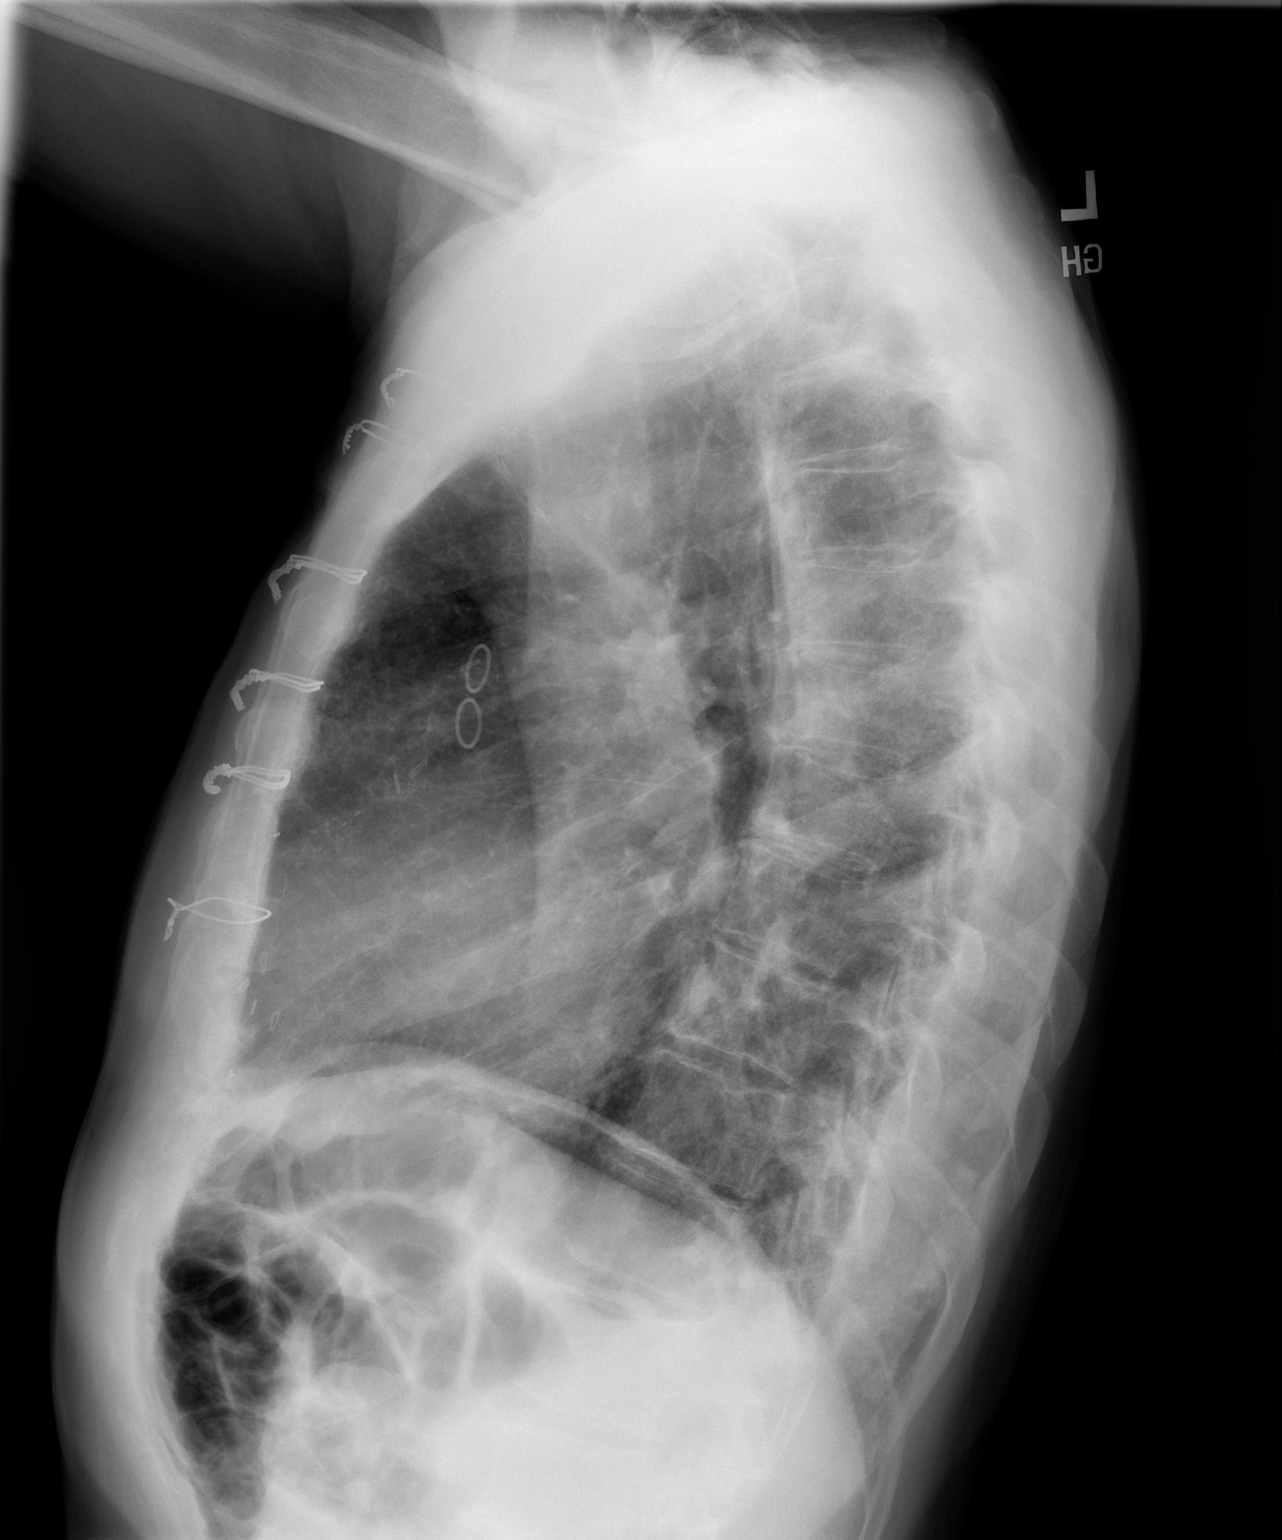

[2 of 2 positions shown; findings below may reference images not displayed]

FINDINGS: Improved aeration at the lung bases. No significant residual pleural
effusion. No definite pneumothorax. Normal heart size. No acute
osseous abnormality.
IMPRESSION: Improved aeration.  No residual pneumothorax or pleural effusion.

## 2022-06-24 DIAGNOSIS — R946 Abnormal results of thyroid function studies: Secondary | ICD-10-CM | POA: Diagnosis not present

## 2022-06-24 DIAGNOSIS — I480 Paroxysmal atrial fibrillation: Secondary | ICD-10-CM | POA: Diagnosis not present

## 2022-06-24 DIAGNOSIS — Z23 Encounter for immunization: Secondary | ICD-10-CM | POA: Diagnosis not present

## 2022-06-24 DIAGNOSIS — E875 Hyperkalemia: Secondary | ICD-10-CM | POA: Diagnosis not present

## 2022-06-24 DIAGNOSIS — C4490 Unspecified malignant neoplasm of skin, unspecified: Secondary | ICD-10-CM | POA: Diagnosis not present

## 2022-06-24 DIAGNOSIS — R809 Proteinuria, unspecified: Secondary | ICD-10-CM | POA: Diagnosis not present

## 2022-06-24 DIAGNOSIS — I25810 Atherosclerosis of coronary artery bypass graft(s) without angina pectoris: Secondary | ICD-10-CM | POA: Diagnosis not present

## 2022-06-24 DIAGNOSIS — D509 Iron deficiency anemia, unspecified: Secondary | ICD-10-CM | POA: Diagnosis not present

## 2022-06-24 DIAGNOSIS — E1169 Type 2 diabetes mellitus with other specified complication: Secondary | ICD-10-CM | POA: Diagnosis not present

## 2022-06-24 DIAGNOSIS — E782 Mixed hyperlipidemia: Secondary | ICD-10-CM | POA: Diagnosis not present

## 2022-06-24 DIAGNOSIS — Z0000 Encounter for general adult medical examination without abnormal findings: Secondary | ICD-10-CM | POA: Diagnosis not present

## 2022-06-24 DIAGNOSIS — Z951 Presence of aortocoronary bypass graft: Secondary | ICD-10-CM | POA: Diagnosis not present

## 2022-06-30 DIAGNOSIS — Z48817 Encounter for surgical aftercare following surgery on the skin and subcutaneous tissue: Secondary | ICD-10-CM | POA: Diagnosis not present

## 2022-06-30 DIAGNOSIS — Z4801 Encounter for change or removal of surgical wound dressing: Secondary | ICD-10-CM | POA: Diagnosis not present

## 2022-09-01 ENCOUNTER — Ambulatory Visit: Payer: Medicare Other | Admitting: Cardiology

## 2022-09-17 ENCOUNTER — Encounter (INDEPENDENT_AMBULATORY_CARE_PROVIDER_SITE_OTHER): Payer: Medicare HMO | Admitting: Ophthalmology

## 2022-10-10 ENCOUNTER — Emergency Department (HOSPITAL_COMMUNITY): Payer: Medicare Other

## 2022-10-10 ENCOUNTER — Other Ambulatory Visit: Payer: Self-pay

## 2022-10-10 ENCOUNTER — Emergency Department (HOSPITAL_COMMUNITY)
Admission: EM | Admit: 2022-10-10 | Discharge: 2022-10-10 | Disposition: A | Discharge status: Home / Self Care | Payer: Medicare Other | Attending: Emergency Medicine | Admitting: Emergency Medicine

## 2022-10-10 ENCOUNTER — Encounter (HOSPITAL_COMMUNITY): Payer: Self-pay

## 2022-10-10 DIAGNOSIS — Z7982 Long term (current) use of aspirin: Secondary | ICD-10-CM | POA: Insufficient documentation

## 2022-10-10 DIAGNOSIS — S0001XA Abrasion of scalp, initial encounter: Secondary | ICD-10-CM | POA: Insufficient documentation

## 2022-10-10 DIAGNOSIS — Z951 Presence of aortocoronary bypass graft: Secondary | ICD-10-CM | POA: Diagnosis not present

## 2022-10-10 DIAGNOSIS — Z7902 Long term (current) use of antithrombotics/antiplatelets: Secondary | ICD-10-CM | POA: Diagnosis not present

## 2022-10-10 DIAGNOSIS — R55 Syncope and collapse: Secondary | ICD-10-CM | POA: Insufficient documentation

## 2022-10-10 DIAGNOSIS — Z85828 Personal history of other malignant neoplasm of skin: Secondary | ICD-10-CM | POA: Insufficient documentation

## 2022-10-10 DIAGNOSIS — Y92009 Unspecified place in unspecified non-institutional (private) residence as the place of occurrence of the external cause: Secondary | ICD-10-CM | POA: Diagnosis not present

## 2022-10-10 DIAGNOSIS — I2510 Atherosclerotic heart disease of native coronary artery without angina pectoris: Secondary | ICD-10-CM | POA: Insufficient documentation

## 2022-10-10 DIAGNOSIS — E119 Type 2 diabetes mellitus without complications: Secondary | ICD-10-CM | POA: Insufficient documentation

## 2022-10-10 DIAGNOSIS — S0990XA Unspecified injury of head, initial encounter: Secondary | ICD-10-CM | POA: Diagnosis present

## 2022-10-10 DIAGNOSIS — W01198A Fall on same level from slipping, tripping and stumbling with subsequent striking against other object, initial encounter: Secondary | ICD-10-CM | POA: Diagnosis not present

## 2022-10-10 DIAGNOSIS — R319 Hematuria, unspecified: Secondary | ICD-10-CM | POA: Diagnosis not present

## 2022-10-10 LAB — URINALYSIS, ROUTINE W REFLEX MICROSCOPIC
Bacteria, UA: NONE SEEN
Bilirubin Urine: NEGATIVE
Glucose, UA: NEGATIVE mg/dL
Ketones, ur: NEGATIVE mg/dL
Leukocytes,Ua: NEGATIVE
Nitrite: NEGATIVE
Protein, ur: NEGATIVE mg/dL
RBC / HPF: >50 RBC/hpf (ref 0–5)
Specific Gravity, Urine: 1.019 (ref 1.005–1.030)
pH: 6 (ref 5.0–8.0)

## 2022-10-10 LAB — CBC
HCT: 38.8 % — ABNORMAL LOW (ref 39.0–52.0)
Hemoglobin: 12.4 g/dL — ABNORMAL LOW (ref 13.0–17.0)
MCH: 31.5 pg (ref 26.0–34.0)
MCHC: 32 g/dL (ref 30.0–36.0)
MCV: 98.5 fL (ref 80.0–100.0)
Platelets: 208 10*3/uL (ref 150–400)
RBC: 3.94 MIL/uL — ABNORMAL LOW (ref 4.22–5.81)
RDW: 13 % (ref 11.5–15.5)
WBC: 7.9 10*3/uL (ref 4.0–10.5)
nRBC: 0 % (ref 0.0–0.2)

## 2022-10-10 LAB — BASIC METABOLIC PANEL
Anion gap: 7 (ref 5–15)
BUN: 31 mg/dL — ABNORMAL HIGH (ref 8–23)
CO2: 28 mmol/L (ref 22–32)
Calcium: 8.9 mg/dL (ref 8.9–10.3)
Chloride: 100 mmol/L (ref 98–111)
Creatinine, Ser: 0.92 mg/dL (ref 0.61–1.24)
GFR, Estimated: >60 mL/min (ref >60)
Glucose, Bld: 165 mg/dL — ABNORMAL HIGH (ref 70–99)
Potassium: 3.7 mmol/L (ref 3.5–5.1)
Sodium: 135 mmol/L (ref 135–145)

## 2022-10-10 LAB — TROPONIN I (HIGH SENSITIVITY): Troponin I (High Sensitivity): 10 ng/L (ref <18)

## 2022-10-10 MED ORDER — SODIUM CHLORIDE 0.9 % IV BOLUS
500.0000 mL | Freq: Once | INTRAVENOUS | Status: AC
  Administered 2022-10-10: 500 mL via INTRAVENOUS

## 2022-10-10 NOTE — Discharge Instructions (Addendum)
Have someone stay with you until you feel stable. Do not drive, operate machinery, or play sports until your caregiver says it is okay. Keep all follow-up appointments as directed by your caregiver. Lie down right away if you start feeling like you might faint. Breathe deeply and steadily. Wait until all the symptoms have passed.Drink enough fluids to keep your urine clear or pale yellow. If you are taking blood pressure or heart medicine, get up slowly, taking several minutes to sit and then stand. This can reduce dizziness. SEEK IMMEDIATE MEDICAL CARE IF: You have a severe headache. You have unusual pain in the chest, abdomen, or back. You are bleeding from the mouth or rectum, or you have a black or tarry stool. You have an irregular or very fast heartbeat. You have pain with breathing. You have repeated fainting or seizure-like jerking during an episode. You faint when sitting or lying down. You have confusion. You have difficulty walking. You have severe weakness. You have vision problems. If you fainted, call your local emergency services - do not drive yourself to the hospital.   It was a pleasure caring for you today in the emergency department.  Please return to the emergency department immediately for any new or concerning symptoms, or if you get worse.  Please follow-up with your allergy in regards to small mount of blood found in your urine

## 2022-10-10 NOTE — ED Notes (Signed)
Gave pt a urinal at bedside and made aware a sample was needed.

## 2022-10-10 NOTE — ED Provider Notes (Signed)
[facility] Provider Note  CSN: [id number] Arrival date & time: 10/10/22 2053  Chief Complaint(s) Loss of Consciousness  HPI Adam [name] is a 82 y.o. male with past medical history as below, significant for CABG x 5, NSTEMI, glaucoma, HLD who presents to the ED with complaint of syncope.  Patient reports prior to arrival around 1 PM today he was at his daughter's house, drink 2 cups of coffee, he stood up felt lightheaded and had brief LOC.  No seizure activity observed by family.  No incontinence.  No chest pain or dyspnea prior to or following the event.  No nausea or vomiting.  No headache.  Does have abrasion to scalp from the injury.  On Plavix.  Feels back to baseline this time.  Compliant with home medications, no numbness or tingling seems new.  Ambulatory since the event.  No change in bowel or bladder function, no change in p.o. intake.  Past Medical History Past Medical History:  Diagnosis Date   Borderline diabetic    DIET CONTROLLED   CAD (coronary artery disease)    a. s/p NSTEMI in 05/2021 and cath showing multivessel CAD --> s/p CABG on 06/09/2021 with LIMA-LAD, SVG-PDA, seq SVG-RI1-RI2-OM1.   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 Authorizing Provider  aspirin EC 81 MG tablet Take 81 mg by mouth every evening. Swallow whole.    [provider]  atorvastatin (LIPITOR) 80 MG tablet Take 1 tablet (80 mg total) by mouth daily. Patient taking differently: Take 80 mg by mouth every evening. 06/14/21   Barrett, Erin R, PA-C  brimonidine (ALPHAGAN) 0.2 % ophthalmic solution INSTILL 1 DROP INTO RIGHT EYE TWICE DAILY Patient taking differently: Place 1 drop into both eyes in the morning and at bedtime. Take at [place] 09/30/20   Rankin, Clent Demark, MD  CHELATED ZINC PO Take 1 tablet by mouth once a week.    [provider]  Cholecalciferol (VITAMIN D3) 50 MCG (2000 UT) TABS Take 2,000 Units by mouth every evening.    [provider]  Chromium 200 MCG CAPS Take 1 capsule by mouth every evening.    [provider]  clopidogrel (PLAVIX) 75 MG tablet Take 1 tablet (75 mg total) by mouth daily. 07/11/21   Strader, Fransisco Hertz, PA-C  Cyanocobalamin (VITAMIN B-12) 2500 MCG SUBL Place under the tongue every evening.    [provider]  ferrous sulfate 325 (65 FE) MG tablet Take 325 mg by mouth every evening.  [provider]  latanoprost (XALATAN) 0.005 % ophthalmic solution Place 1 drop into both eyes at bedtime. 05/21/21   [provider]  MAGNESIUM PO Take 250 mg by mouth every 14 (fourteen) days.    [provider]  melatonin 5 MG TABS Take 5 mg by mouth at bedtime.    [provider]  timolol (TIMOPTIC) 0.5 % ophthalmic solution INSTILL 1 DROP INTO RIGHT EYE ONCE DAILY Patient taking differently: Place 1 drop into both eyes 2 (two) times daily. Take Midnight and Noon daily 09/30/20   Rankin, Clent Demark, MD  VANADIUM PO  Take 1 tablet by mouth 2 (two) times a week.    [provider]  VITAMIN A PO Take 1 tablet by mouth once a week.    [provider]                                                                                                                                    Past Surgical History Past Surgical History:  Procedure Laterality Date   APPENDECTOMY  07/1972   [place]   [facility]OTID [facility]ARTERECTOMY  10/03/10   [facility]   CATARACT EXTRACTION W/PHACO  01/26/2011   Procedure: CATARACT EXTRACTION PHACO AND INTRAOCULAR LENS PLACEMENT (IOC);  Surgeon: Williams Che;  Location: [facility];  Service: Ophthalmology;  Laterality: Right;  CDE:8.64   CATARACT EXTRACTION W/PHACO Left 02/12/2014   Procedure: CATARACT EXTRACTION PHACO AND INTRAOCULAR LENS PLACEMENT (IOC);  Surgeon: Williams Che, MD;  Location: [facility];  Service: Ophthalmology;  Laterality: Left;  CDE 6.25   CORONARY ARTERY BYPASS GRAFT N/A 06/09/2021   Procedure: CORONARY ARTERY BYPASS GRAFTING (CABG), ON PUMP, TIMES FIVE, USING LEFT INTERNAL MAMMARY ARTERY AND RIGHT ENDOSCOPICALLY HARVESTED GREATER SAPHENOUS VEIN;  Surgeon: [name], MD;  Location: [facility];  Service: Open Heart Surgery;  Laterality: N/A;   [place]   MD   EYE SURGERY  06/30/2010   detached retina, gsbo   EYE SURGERY Left Nov 26, 2014   Detached Retina   HYDROCELE EXCISION Left 09/30/2015   Procedure: HYDROCELECTOMY ADULT;  Surgeon: Cleon Gustin, MD;  Location: [facility];  Service: Urology;  Laterality: Left;   IRRIGATION AND DEBRIDEMENT SEBACEOUS CYST  2001, 2007   [place], [place], in [place] office   LEFT HEART CATH AND CORONARY ANGIOGRAPHY N/A 06/06/2021   Procedure: LEFT HEART CATH AND CORONARY ANGIOGRAPHY;  Surgeon: Sherren Mocha, MD;  Location: [facility];  Service: Cardiovascular;  Laterality: N/A;   SKIN CANCER EXCISION  2010   pre cancerous tissue:nose, scalp, DR. Hall's office   TEE WITHOUT CARDIOVERSION N/A 06/09/2021    Procedure: TRANSESOPHAGEAL ECHOCARDIOGRAM (TEE);  Surgeon: [name], MD;  Location: [facility];  Service: Open Heart Surgery;  Laterality: N/A;   TESTICULAR EXPLORATION Left 09/30/2015   Procedure: INGUINAL PARATESTICULAR  MASS REMOVAL;  Surgeon: Cleon Gustin, MD;  Location:  [facility];  Service: Urology;  Laterality: Left;   YAG LASER APPLICATION Right [id number]   Procedure: YAG LASER APPLICATION;  Surgeon: Williams Che, MD;  Location: [facility];  Service: Ophthalmology;  Laterality: Right;   Family History Family History  Problem Relation Age of Onset   Hypertension Father    Heart disease Father        After age 20   Anesthesia problems Neg Hx    Hypotension Neg Hx    Malignant hyperthermia Neg Hx    Pseudochol deficiency Neg Hx     Social History Social History   Tobacco Use   Smoking status: Never   Smokeless tobacco: Never  Vaping Use   Vaping Use: Never used  Substance Use Topics   Alcohol use: No   Drug use: No   Allergies Patient has no known allergies.  Review of Systems Review of Systems  Constitutional:  Negative for chills and fever.  HENT:  Negative for facial swelling and trouble swallowing.   Eyes:  Negative for photophobia and visual disturbance.  Respiratory:  Negative for cough and shortness of breath.   Cardiovascular:  Negative for chest pain and palpitations.  Gastrointestinal:  Negative for abdominal pain, nausea and vomiting.  Endocrine: Negative for polydipsia and polyuria.  Genitourinary:  Negative for difficulty urinating and hematuria.  Musculoskeletal:  Negative for gait problem and joint swelling.  Skin:  Negative for pallor and rash.  Neurological:  Positive for syncope and light-headedness. Negative for headaches.  Psychiatric/Behavioral:  Negative for agitation and confusion.     Physical Exam Vital Signs  I have reviewed the triage vital signs BP (!) 141/62   Pulse 67   Temp 98.1 F (36.7 C)   Resp 12   Ht 5\' 10"   (1.778 m)   Wt 61.5 kg   SpO2 100%   BMI 19.46 kg/m  Physical Exam Vitals and nursing note reviewed.  Constitutional:      General: He is not in acute distress.    Appearance: Normal appearance. He is well-developed. He is not ill-appearing or diaphoretic.  HENT:     Head: Normocephalic. No raccoon eyes, Battle's sign, right periorbital erythema or left periorbital erythema.     Jaw: There is normal jaw occlusion.     Comments: Abrasion scalp    Right Ear: External ear normal.     Left Ear: External ear normal.     Mouth/Throat:     Mouth: Mucous membranes are moist.  Eyes:     General: No scleral icterus.    Extraocular Movements: Extraocular movements intact.     Pupils: Pupils are equal, round, and reactive to light.  Cardiovascular:     Rate and Rhythm: Normal rate and regular rhythm.     Pulses: Normal pulses.     Heart sounds: Normal heart sounds.  Pulmonary:     Effort: Pulmonary effort is normal. No respiratory distress.     Breath sounds: Normal breath sounds.  Abdominal:     General: Abdomen is flat.     Palpations: Abdomen is soft.     Tenderness: There is no abdominal tenderness. There is no guarding or rebound.  Musculoskeletal:     Right lower leg: No edema.     Left lower leg: No edema.     Comments: Pelvis stable to AP pressure  No pain to either lower extremity with logroll  Skin:    General: Skin is warm and dry.     Capillary  Refill: Capillary refill takes less than 2 seconds.  Neurological:     Mental Status: He is alert and oriented to person, place, and time.     GCS: GCS eye subscore is 4. GCS verbal subscore is 5. GCS motor subscore is 6.     Cranial Nerves: Cranial nerves 2-12 are intact.     Sensory: Sensation is intact.     Motor: Motor function is intact.     Coordination: Coordination is intact.     Gait: Gait is intact.  Psychiatric:        Mood and Affect: Mood normal.        Behavior: Behavior normal.     ED Results and  Treatments Labs (all labs ordered are listed, but only abnormal results are displayed) Labs Reviewed  BASIC METABOLIC PANEL - Abnormal; Notable for the following components:      Result Value   Glucose, Bld 165 (*)    BUN 31 (*)    All other components within normal limits  CBC - Abnormal; Notable for the following components:   RBC 3.94 (*)    Hemoglobin 12.4 (*)    HCT 38.8 (*)    All other components within normal limits  URINALYSIS, ROUTINE W REFLEX MICROSCOPIC - Abnormal; Notable for the following components:   Hgb urine dipstick MODERATE (*)    All other components within normal limits  TROPONIN I (HIGH SENSITIVITY)                                                                                                                          Radiology DG Chest Portable 1 View  Result Date: 10/10/2022 CLINICAL DATA:  Syncope, dizziness EXAM: PORTABLE CHEST 1 VIEW COMPARISON:  07/15/2021 FINDINGS: Single frontal view of the chest demonstrates a stable cardiac silhouette. Postsurgical changes from CABG. Diffuse interstitial scarring without airspace disease, effusion, or pneumothorax. No acute bony abnormalities. IMPRESSION: 1. No acute intrathoracic process. Electronically Signed   By: Randa Ngo M.D.   On: 10/10/2022 22:25   CT Cervical Spine Wo Contrast  Result Date: 10/10/2022 CLINICAL DATA:  Syncope, fell, dizziness EXAM: CT CERVICAL SPINE WITHOUT CONTRAST TECHNIQUE: Multidetector CT imaging of the cervical spine was performed without intravenous contrast. Multiplanar CT image reconstructions were also generated. RADIATION DOSE REDUCTION: This exam was performed according to the departmental dose-optimization program which includes automated exposure control, adjustment of the mA and/or kV according to patient size and/or use of iterative reconstruction technique. COMPARISON:  None Available. FINDINGS: Alignment: There is minimal degenerative anterolisthesis of C3 relative to C4.  Otherwise alignment is anatomic. Skull base and vertebrae: No acute fracture. No primary bone lesion or focal pathologic process. Soft tissues and spinal canal: No prevertebral fluid or swelling. No visible canal hematoma. Disc levels: There is diffuse cervical spondylosis and facet hypertrophy. Disc space narrowing and osteophyte formation most pronounced at the C5-6 and C6-7 levels. There is right predominant neural foraminal encroachment C3-4 and left predominant  neural foraminal encroachment at C4-5. Hypertrophic changes are seen at the C1-C2 interface. Upper chest: Airway is patent. Biapical pleural and parenchymal scarring. No acute airspace disease. Other: Reconstructed images demonstrate no additional findings. IMPRESSION: 1. No acute cervical spine fracture. 2. Diffuse multilevel cervical spondylosis and facet hypertrophy. Electronically Signed   By: Randa Ngo M.D.   On: 10/10/2022 22:06   CT HEAD WO CONTRAST  Result Date: 10/10/2022 CLINICAL DATA:  Syncope/presyncope. Dizziness with syncopal episode. EXAM: CT HEAD WITHOUT CONTRAST TECHNIQUE: Contiguous axial images were obtained from the base of the skull through the vertex without intravenous contrast. RADIATION DOSE REDUCTION: This exam was performed according to the departmental dose-optimization program which includes automated exposure control, adjustment of the mA and/or kV according to patient size and/or use of iterative reconstruction technique. COMPARISON:  None Available. FINDINGS: Brain: No acute intracranial hemorrhage, midline shift or mass effect. No extra-axial fluid collection. Atrophy is noted. Periventricular and subcortical white matter hypodensities are noted bilaterally. There is encephalomalacia in the frontal lobe on the left extending into the insula on the left. Vascular: No hyperdense vessel or unexpected calcification. Skull: No acute fracture. Sinuses/Orbits: Round density in the left maxillary sinus, possible mucosal  retention cyst or polyp. No acute orbital abnormality. Other: Bandage material over the parietal bone on the right with small foci of air in the scalp. Periosteal elevation and a few bony erosions are noted in the region of the scalp. IMPRESSION: 1. No acute intracranial hemorrhage. 2. Atrophy with chronic microvascular ischemic changes. 3. Encephalomalacia in the frontal lobe on the left extending into the insula, suggesting old infarct. 4. Bandage material over the scalp on the right with periosteal elevation and erosions along the scalp in the in the parietal bone on the right. Clinical correlation is recommended to exclude infection or neoplastic process. Electronically Signed   By: Brett Fairy M.D.   On: 10/10/2022 22:03    Pertinent labs & imaging results that were available during my care of the patient were reviewed by me and considered in my medical decision making (see MDM for details).  Medications Ordered in ED Medications  sodium chloride 0.9 % bolus 500 mL (0 mLs Intravenous Stopped 10/10/22 2305)                                                                                                                                     Procedures Procedures  (including critical care time)  Medical Decision Making / ED Course    Medical Decision Making:    RISHAN ORTOLANI is a 82 y.o. male with past medical history as below, significant for CABG x 5, NSTEMI, glaucoma, HLD who presents to the ED with complaint of syncope.. The complaint involves an extensive differential diagnosis and also carries with it a high risk of complications and morbidity.  Serious etiology was considered. Ddx includes but is not limited to: Differential diagnoses for  head trauma includes subdural hematoma, epidural hematoma, acute concussion, traumatic subarachnoid hemorrhage, cerebral contusions, etc. given syncope concern for cardiogenic syncope, simple syncope, vasovagal, orthostatic, medication effect, volume  status, seizure, etc.   Complete initial physical exam performed, notably the patient  was no acute distress, asymptomatic, resting comfortably.Claudean Severance is nonfocal.  Ambulatory steady Reviewed and confirmed nursing documentation for past medical history, family history, social history.  Vital signs reviewed.    Clinical Course as of 10/11/22 0006  Sat Oct 10, 2022  2242 Symptoms resolved [SG]    Clinical Course User Index [SG] Jeanell Sparrow, DO   [place] syncope rule low  He has some hematuria in his urine, advised to follow-up with urology, no abdominal pain.  No unexpected weight changes, no frank bleeding   Patient presents with syncopal symptoms without worrisome features. Presentation most suggestive of neuro-cardiogenic or orthostatic cause. Very low suspicion for serious arrhythmia, cardiac ischemia or other serious etiology. ECG reviewed, no evidence of a cardiac arrhythmia such as [place], WPW, HOCM, IHSS, Long or short QT. Neurologic exam is nonfocal, not consistent with CVA or primary neurologic abnormality. Patient appears safe for discharge with outpatient observation and close PCP F/U. Syncope warnings discussed with patient. The patient has been instructed to return immediately if the symptoms worsen in any way. Patient verbalized understanding and is in agreement with current care plan. All questions answered prior to discharge.    Additional history obtained: -Additional history obtained from na -External records from outside source obtained and reviewed including: Chart review including previous notes, labs, imaging, consultation notes including primary care documentation, home medications, prior labs and imaging   Lab Tests: -I ordered, reviewed, and interpreted labs.   The pertinent results include:   Labs Reviewed  BASIC METABOLIC PANEL - Abnormal; Notable for the following components:      Result Value   Glucose, Bld 165 (*)    BUN 31 (*)    All  other components within normal limits  CBC - Abnormal; Notable for the following components:   RBC 3.94 (*)    Hemoglobin 12.4 (*)    HCT 38.8 (*)    All other components within normal limits  URINALYSIS, ROUTINE W REFLEX MICROSCOPIC - Abnormal; Notable for the following components:   Hgb urine dipstick MODERATE (*)    All other components within normal limits  TROPONIN I (HIGH SENSITIVITY)    Notable for stable  EKG   EKG Interpretation  Date/Time:  Saturday October 10 2022 21:41:08 EDT Ventricular Rate:  70 PR Interval:  182 QRS Duration: 140 QT Interval:  418 QTC Calculation: 451 R Axis:   114 Text Interpretation: Sinus rhythm Right bundle branch block Baseline wander in lead(s) V1 similar to prior Confirmed by Wynona Dove ([id number]) on 10/11/2022 12:06:38 AM         Imaging Studies ordered: I ordered imaging studies including CT head and cervical spine, chest x-ray I independently visualized the following imaging with scope of interpretation limited to determining acute life threatening conditions related to emergency care; findings noted above, significant for stable I independently visualized and interpreted imaging. I agree with the radiologist interpretation   Medicines ordered and prescription drug management: Meds ordered this encounter  Medications   sodium chloride 0.9 % bolus 500 mL    -I have reviewed the patients home medicines and have made adjustments as needed   Consultations Obtained: na   Cardiac Monitoring: The patient was maintained on a cardiac  monitor.  I personally viewed and interpreted the cardiac monitored which showed an underlying rhythm of: NSR  Social Determinants of Health:  Diagnosis or treatment significantly limited by social determinants of health: na   Reevaluation: After the interventions noted above, I reevaluated the patient and found that they have resolved  Co morbidities that complicate the patient evaluation  Past  Medical History:  Diagnosis Date   Borderline diabetic    DIET CONTROLLED   CAD (coronary artery disease)    a. s/p NSTEMI in 05/2021 and cath showing multivessel CAD --> s/p CABG on 06/09/2021 with LIMA-LAD, SVG-PDA, seq SVG-RI1-RI2-OM1.   History of kidney stones    Hyperlipemia    Paratesticular mass    PONV (postoperative nausea and vomiting)    Postoperative atrial fibrillation (HCC)    Stroke ([place])    Right Eye / VISUAL PROBLEM RESOLVED AFTER ENDARTERECTOMY      Dispostion: Disposition decision including need for hospitalization was considered, and patient discharged from emergency department.    Final Clinical Impression(s) / ED Diagnoses Final diagnoses:  Syncope, unspecified syncope type  Hematuria, unspecified type     This chart was dictated using voice recognition software.  Despite best efforts to proofread,  errors can occur which can change the documentation meaning.    Jeanell Sparrow, DO 10/11/22 0006

## 2022-10-10 NOTE — ED Triage Notes (Signed)
Patient was his daughters house, became dizzy, and had a syncopal episode. LOC for a few seconds. Patient hit his head. Patient is being treated for skin cancer. Patient noticed when he got home he had bleeding coming from the bandage area on his head when he fell and where his skin cancer is being treated. Patient takes Plavix. Fall occurred at 1330.

## 2022-10-25 ENCOUNTER — Other Ambulatory Visit: Payer: Self-pay

## 2022-10-25 ENCOUNTER — Emergency Department (HOSPITAL_COMMUNITY): Payer: Medicare Other

## 2022-10-25 ENCOUNTER — Emergency Department (HOSPITAL_COMMUNITY)
Admission: EM | Admit: 2022-10-25 | Discharge: 2022-10-25 | Disposition: A | Discharge status: Home / Self Care | Payer: Medicare Other | Attending: Emergency Medicine | Admitting: Emergency Medicine

## 2022-10-25 ENCOUNTER — Encounter (HOSPITAL_COMMUNITY): Payer: Self-pay | Admitting: *Deleted

## 2022-10-25 DIAGNOSIS — Z7982 Long term (current) use of aspirin: Secondary | ICD-10-CM | POA: Diagnosis not present

## 2022-10-25 DIAGNOSIS — Z85828 Personal history of other malignant neoplasm of skin: Secondary | ICD-10-CM | POA: Insufficient documentation

## 2022-10-25 DIAGNOSIS — I2510 Atherosclerotic heart disease of native coronary artery without angina pectoris: Secondary | ICD-10-CM | POA: Diagnosis not present

## 2022-10-25 DIAGNOSIS — Z23 Encounter for immunization: Secondary | ICD-10-CM | POA: Insufficient documentation

## 2022-10-25 DIAGNOSIS — S0992XA Unspecified injury of nose, initial encounter: Secondary | ICD-10-CM | POA: Diagnosis present

## 2022-10-25 DIAGNOSIS — S022XXA Fracture of nasal bones, initial encounter for closed fracture: Secondary | ICD-10-CM | POA: Insufficient documentation

## 2022-10-25 DIAGNOSIS — W01198A Fall on same level from slipping, tripping and stumbling with subsequent striking against other object, initial encounter: Secondary | ICD-10-CM | POA: Diagnosis not present

## 2022-10-25 DIAGNOSIS — W19XXXA Unspecified fall, initial encounter: Secondary | ICD-10-CM

## 2022-10-25 DIAGNOSIS — S0181XA Laceration without foreign body of other part of head, initial encounter: Secondary | ICD-10-CM | POA: Diagnosis not present

## 2022-10-25 DIAGNOSIS — S022XXB Fracture of nasal bones, initial encounter for open fracture: Secondary | ICD-10-CM

## 2022-10-25 MED ORDER — LIDOCAINE-EPINEPHRINE-TETRACAINE (LET) TOPICAL GEL
3.0000 mL | Freq: Once | TOPICAL | Status: AC
  Administered 2022-10-25: 3 mL via TOPICAL
  Filled 2022-10-25: qty 3

## 2022-10-25 MED ORDER — TETANUS-DIPHTH-ACELL PERTUSSIS 5-2.5-18.5 LF-MCG/0.5 IM SUSY
0.5000 mL | PREFILLED_SYRINGE | Freq: Once | INTRAMUSCULAR | Status: AC
  Administered 2022-10-25: 0.5 mL via INTRAMUSCULAR
  Filled 2022-10-25: qty 0.5

## 2022-10-25 MED ORDER — LIDOCAINE-EPINEPHRINE (PF) 2 %-1:200000 IJ SOLN
10.0000 mL | Freq: Once | INTRAMUSCULAR | Status: AC
  Administered 2022-10-25: 10 mL
  Filled 2022-10-25: qty 20

## 2022-10-25 MED ORDER — CEPHALEXIN 500 MG PO CAPS
500.0000 mg | ORAL_CAPSULE | Freq: Once | ORAL | Status: AC
  Administered 2022-10-25: 500 mg via ORAL
  Filled 2022-10-25: qty 1

## 2022-10-25 MED ORDER — OXYMETAZOLINE HCL 0.05 % NA SOLN
2.0000 | Freq: Once | NASAL | Status: AC
  Administered 2022-10-25: 2 via NASAL
  Filled 2022-10-25: qty 30

## 2022-10-25 MED ORDER — PENTAFLUOROPROP-TETRAFLUOROETH EX AERO
INHALATION_SPRAY | Freq: Once | CUTANEOUS | Status: DC
  Filled 2022-10-25: qty 30

## 2022-10-25 MED ORDER — SILVER NITRATE-POT NITRATE 75-25 % EX MISC
1.0000 | Freq: Once | CUTANEOUS | Status: AC
  Administered 2022-10-25: 1 via TOPICAL
  Filled 2022-10-25 (×2): qty 10

## 2022-10-25 MED ORDER — CEPHALEXIN 500 MG PO CAPS: 500.0000 mg | ORAL_CAPSULE | Freq: Three times a day (TID) | ORAL | 0 refills | Status: AC

## 2022-10-25 NOTE — ED Provider Notes (Signed)
  Physical Exam  BP 118/75 (BP Location: Right Arm)   Pulse (!) 54   Temp (!) 97.2 F (36.2 C)   Resp 18   Ht 5\' 10"  (1.778 m)   Wt 61.2 kg   SpO2 100%   BMI 19.37 kg/m   Physical Exam Vitals and nursing note reviewed.  Constitutional:      Appearance: Normal appearance.  HENT:     Head: Normocephalic and atraumatic.      Comments: 3.5 cm linear laceration on forehead 0.5 cm laceration to the bridge of the nose Eyes:     Conjunctiva/sclera: Conjunctivae normal.  Pulmonary:     Effort: Pulmonary effort is normal. No respiratory distress.  Skin:    General: Skin is warm and dry.  Neurological:     Mental Status: He is alert.  Psychiatric:        Mood and Affect: Mood normal.        Behavior: Behavior normal.    Procedures  .Marland KitchenLaceration Repair  Date/Time: 10/25/2022 2:08 PM  Performed by: Su Monks, PA-C Authorized by: Su Monks, PA-C   Consent:    Consent obtained:  Verbal   Consent given by:  Patient   Risks, benefits, and alternatives were discussed: yes     Risks discussed:  Infection, pain, poor cosmetic result and poor wound healing Universal protocol:    Patient identity confirmed:  Provided demographic data Anesthesia:    Anesthesia method:  Topical application and local infiltration   Topical anesthetic:  LET   Local anesthetic:  Lidocaine 2% WITH epi Laceration details:    Location:  Face   Face location:  Forehead   Length (cm):  3.5 (nose - 0.5 cm)   Depth (mm):  1 Exploration:    Hemostasis achieved with:  LET and epinephrine   Imaging obtained comment:  CT head   Imaging outcome: foreign body not noted   Treatment:    Area cleansed with:  Saline   Amount of cleaning:  Standard Skin repair:    Repair method:  Sutures and tissue adhesive   Suture size:  5-0   Suture material:  Prolene   Suture technique:  Simple interrupted   Number of sutures:  7 Approximation:    Approximation:  Close Repair type:    Repair type:   Intermediate Post-procedure details:    Dressing:  Non-adherent dressing   Procedure completion:  Tolerated well, no immediate complications Comments:     Forehead - 7 lacerations, 5-0 prolene Nose - dermabond tissue adhesive    Su Monks, PA-C 10/25/22 1413    Jacalyn Lefevre, MD 10/25/22 1615

## 2022-10-25 NOTE — ED Notes (Signed)
AMBULATED PATIENT TO RESTROOM. PATIENT ABLE TO STAND AND AMBULATE FREELY WITHOUT ASSISTANCE. PATIENT DENIES ANY DIZZINESS

## 2022-10-25 NOTE — ED Triage Notes (Signed)
Pt states he got up and lost his balance and fell face forward, hitting his head on tiled floor. Pt denies LOC but with c/o dizziness.  Pt is currently on Plavix.  Pt with lac to forehead and had a nose bleed-bleeding controlled at present.

## 2022-10-25 NOTE — ED Notes (Signed)
PATIENT REPORTS HE LOST HIS BALANCE WHEN STANDING UP AT [facility]; FELL AND HIT HIS HEAD ON THE FLOOR. DENIES LOC. PATIENT REPORTS LACERATION TO HIS FOREHEAD AND BRIDGE OF NOSE; ALSO REPORTS BLEEDING FROM HIS NOSE. PATIENT IS ALERT AND ORIENTED, AMBU WITH ASSIST. DENIES DIZZINESS. UPON INSPECTION, 2 BANDAGES NOTED TO THE TOP OF PATIENT'S HEAD; REPORTS THESE ARE FROM PREVIOUS TREATMENTS AND NOT NEW INJURIES FROM TODAY'S FALL. REPORTS PAIN IS 3/10. BLEEDING FROM FOREHEAD AND BRIDGE OF NOSE CONTROLLED AT THIS TIME

## 2022-10-25 NOTE — Discharge Instructions (Addendum)
We have closed your laceration(s) with sutures and dermabond tissue adhesive. These sutures need to be removed in 5-7 days. This can be done at any doctor's office, urgent care, or emergency department.   If any of the sutures come out before it is time for removal, that is okay. Make sure to keep the area as clean and dry as possible. You can let warm soapy warm run over the area, but do NOT scrub it.   The glue should peel off in about 5 to 10 days.  If it comes off sooner that is okay.  If it lasts longer than that, you can use some Vaseline to help it come off on its own.  We updated your tetanus vaccinations. Watch out for signs of infection, like we discussed, including: increased redness, tenderness, or drainage of pus from the area. You have been prescribed an antibiotic to prevent infection.

## 2022-10-25 NOTE — ED Provider Notes (Signed)
[facility]PARTMENT AT [facility] Provider Note   CSN: [id number] Arrival date & time: 10/25/22  1103     History  Chief Complaint  Patient presents with   Adam [name] is a 82 y.o. male.  Pt is a 82 yo male with pmhx significant for hld, cva, kidney stones, cad (on asa and plavix; no blood thinners), and skin cancer.  Pt was at Sunday school today and stood up too fast.  He lost his balance and fell face forward. He did sustain a lac to his forehead and to his nose.  He denies loc.       Home Medications Prior to Admission medications   Medication Sig Start Date End Date Taking? Authorizing Provider  cephALEXin (KEFLEX) 500 MG capsule Take 1 capsule (500 mg total) by mouth 3 (three) times daily for 5 days. 10/25/22 10/30/22 Yes Jacalyn Lefevre, MD  aspirin EC 81 MG tablet Take 81 mg by mouth every evening. Swallow whole.    [provider]  atorvastatin (LIPITOR) 80 MG tablet Take 1 tablet (80 mg total) by mouth daily. Patient taking differently: Take 80 mg by mouth every evening. 06/14/21   Barrett, Erin R, PA-C  brimonidine (ALPHAGAN) 0.2 % ophthalmic solution INSTILL 1 DROP INTO RIGHT EYE TWICE DAILY Patient taking differently: Place 1 drop into both eyes in the morning and at bedtime. Take at Midnight and Noon 09/30/20   Rankin, Alford Highland, MD  CHELATED ZINC PO Take 1 tablet by mouth once a week.    [provider]  Cholecalciferol (VITAMIN D3) 50 MCG (2000 UT) TABS Take 2,000 Units by mouth every evening.    [provider]  Chromium 200 MCG CAPS Take 1 capsule by mouth every evening.    [provider]  clopidogrel (PLAVIX) 75 MG tablet Take 1 tablet (75 mg total) by mouth daily. 07/11/21   Strader, Lennart Pall, PA-C  Cyanocobalamin (VITAMIN B-12) 2500 MCG SUBL Place under the tongue every evening.    [provider]  ferrous sulfate 325 (65 FE) MG tablet Take 325 mg by mouth every evening.    [provider]  latanoprost (XALATAN) 0.005 % ophthalmic solution Place 1 drop into both eyes at bedtime. 05/21/21   [provider]  MAGNESIUM PO Take 250 mg by mouth every 14 (fourteen) days.    [provider]  melatonin 5 MG TABS Take 5 mg by mouth at bedtime.    [provider]  timolol (TIMOPTIC) 0.5 % ophthalmic solution INSTILL 1 DROP INTO RIGHT EYE ONCE DAILY Patient taking differently: Place 1 drop into both eyes 2 (two) times daily. Take Midnight and Noon daily 09/30/20   Rankin, Alford Highland, MD  VANADIUM PO Take 1 tablet by mouth 2 (two) times a week.    [provider]  VITAMIN A PO Take 1 tablet by mouth once a week.    [provider]      Allergies    Patient has no known allergies.    Review of Systems   Review of Systems  HENT:  Positive for facial swelling and nosebleeds.   Skin:  Positive for wound.  All other systems reviewed and are negative.   Physical Exam Updated Vital Signs BP 104/62   Pulse (!) 57   Temp (!) 97.2 F (36.2 C)   Resp 18   Ht 5\' 10"  (1.778 m)   Wt 61.2 kg   SpO2  100%   BMI 19.37 kg/m  Physical Exam Vitals and nursing note reviewed.  Constitutional:      Appearance: Normal appearance.  HENT:     Head: Normocephalic.     Comments: Lac to forehead and to nose; mild bleeding left nare  Bandages to occiput covering a skin cancer    Mouth/Throat:     Mouth: Mucous membranes are moist.     Pharynx: Oropharynx is clear.  Eyes:     Extraocular Movements: Extraocular movements intact.     Conjunctiva/sclera: Conjunctivae normal.     Pupils: Pupils are equal, round, and reactive to light.  Cardiovascular:     Rate and Rhythm: Normal rate and regular rhythm.     Pulses: Normal pulses.     Heart sounds: Normal heart sounds.  Pulmonary:     Effort: Pulmonary effort is normal.     Breath sounds: Normal breath sounds.  Abdominal:     General: Abdomen is flat. Bowel sounds are normal.      Palpations: Abdomen is soft.  Musculoskeletal:        General: Normal range of motion.     Cervical back: Normal range of motion and neck supple.  Skin:    General: Skin is warm.     Capillary Refill: Capillary refill takes less than 2 seconds.  Neurological:     General: No focal deficit present.     Mental Status: He is alert and oriented to person, place, and time.  Psychiatric:        Mood and Affect: Mood normal.        Behavior: Behavior normal.     ED Results / Procedures / Treatments   Labs (all labs ordered are listed, but only abnormal results are displayed) Labs Reviewed - No data to display  EKG None  Radiology CT Head Wo Contrast  Result Date: 10/25/2022 CLINICAL DATA:  Facial trauma due to fall. EXAM: CT HEAD WITHOUT CONTRAST CT MAXILLOFACIAL WITHOUT CONTRAST CT CERVICAL SPINE WITHOUT CONTRAST TECHNIQUE: Multidetector CT imaging of the head, cervical spine, and maxillofacial structures were performed using the standard protocol without intravenous contrast. Multiplanar CT image reconstructions of the cervical spine and maxillofacial structures were also generated. RADIATION DOSE REDUCTION: This exam was performed according to the departmental dose-optimization program which includes automated exposure control, adjustment of the mA and/or kV according to patient size and/or use of iterative reconstruction technique. COMPARISON:  10/10/2022 FINDINGS: CT HEAD FINDINGS Brain: No evidence of acute infarction, hemorrhage, hydrocephalus, extra-axial collection or mass lesion/mass effect. Chronic left frontal insular infarct. Mild generalized cerebral volume loss. Vascular: No hyperdense vessel or unexpected calcification. Skull: No acute fracture. Continued cortical irregularity along the high right parietal bone with overlying scalp thickening/irregularity. CT MAXILLOFACIAL FINDINGS Osseous: Bilateral nasal bone fracture with depression greater on the right. No orbital or ethmoid  continuation. Intact and located mandible. Orbits: Scleral band on the right.  Bilateral cataract resection. Sinuses: Negative for hemosinus Soft tissues: Soft tissue swelling at the forehead and nose. Sizable right submandibular calculus. CT CERVICAL SPINE FINDINGS Alignment: No traumatic malalignment. Skull base and vertebrae: No acute fracture. No primary bone lesion or focal pathologic process. Soft tissues and spinal canal: No prevertebral fluid or swelling. No visible canal hematoma. Postoperative right neck. Disc levels:  Generalized degenerative endplate and facet spurring. Upper chest: No acute finding IMPRESSION: 1. Bilateral nasal bone fracture with mild depression. 2. No evidence of intracranial injury or cervical spine fracture. 3. 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 Electronically Signed   By: Tiburcio Pea M.D.   On: 10/25/2022 13:01   CT Maxillofacial Wo Contrast  Result Date: 10/25/2022 CLINICAL DATA:  Facial trauma due to fall. EXAM: CT HEAD WITHOUT CONTRAST CT MAXILLOFACIAL WITHOUT CONTRAST CT CERVICAL SPINE WITHOUT CONTRAST TECHNIQUE: Multidetector CT imaging of the head, cervical spine, and maxillofacial structures were performed using the standard protocol without intravenous contrast. Multiplanar CT image reconstructions of the cervical spine and maxillofacial structures were also generated. RADIATION DOSE REDUCTION: This exam was performed according to the departmental dose-optimization program which includes automated exposure control, adjustment of the mA and/or kV according to patient size and/or use of iterative reconstruction technique. COMPARISON:  10/10/2022 FINDINGS: CT HEAD FINDINGS Brain: No evidence of acute infarction, hemorrhage, hydrocephalus, extra-axial collection or mass lesion/mass effect. Chronic left frontal insular infarct. Mild generalized cerebral volume loss. Vascular: No hyperdense vessel or unexpected calcification. Skull: No acute  fracture. Continued cortical irregularity along the high right parietal bone with overlying scalp thickening/irregularity. CT MAXILLOFACIAL FINDINGS Osseous: Bilateral nasal bone fracture with depression greater on the right. No orbital or ethmoid continuation. Intact and located mandible. Orbits: Scleral band on the right.  Bilateral cataract resection. Sinuses: Negative for hemosinus Soft tissues: Soft tissue swelling at the forehead and nose. Sizable right submandibular calculus. CT CERVICAL SPINE FINDINGS Alignment: No traumatic malalignment. Skull base and vertebrae: No acute fracture. No primary bone lesion or focal pathologic process. Soft tissues and spinal canal: No prevertebral fluid or swelling. No visible canal hematoma. Postoperative right neck. Disc levels:  Generalized degenerative endplate and facet spurring. Upper chest: No acute finding IMPRESSION: 1. Bilateral nasal bone fracture with mild depression. 2. No evidence of intracranial injury or cervical spine fracture. 3. Known right scalp cancer which involves the parietal bone. Electronically Signed   By: Tiburcio Pea M.D.   On: 10/25/2022 13:01   CT Cervical Spine Wo Contrast  Result Date: 10/25/2022 CLINICAL DATA:  Facial trauma due to fall. EXAM: CT HEAD WITHOUT CONTRAST CT MAXILLOFACIAL WITHOUT CONTRAST CT CERVICAL SPINE WITHOUT CONTRAST TECHNIQUE: Multidetector CT imaging of the head, cervical spine, and maxillofacial structures were performed using the standard protocol without intravenous contrast. Multiplanar CT image reconstructions of the cervical spine and maxillofacial structures were also generated. RADIATION DOSE REDUCTION: This exam was performed according to the departmental dose-optimization program which includes automated exposure control, adjustment of the mA and/or kV according to patient size and/or use of iterative reconstruction technique. COMPARISON:  10/10/2022 FINDINGS: CT HEAD FINDINGS Brain: No evidence of acute  infarction, hemorrhage, hydrocephalus, extra-axial collection or mass lesion/mass effect. Chronic left frontal insular infarct. Mild generalized cerebral volume loss. Vascular: No hyperdense vessel or unexpected calcification. Skull: No acute fracture. Continued cortical irregularity along the high right parietal bone with overlying scalp thickening/irregularity. CT MAXILLOFACIAL FINDINGS Osseous: Bilateral nasal bone fracture with depression greater on the right. No orbital or ethmoid continuation. Intact and located mandible. Orbits: Scleral band on the right.  Bilateral cataract resection. Sinuses: Negative for hemosinus Soft tissues: Soft tissue swelling at the forehead and nose. Sizable right submandibular calculus. CT CERVICAL SPINE FINDINGS Alignment: No traumatic malalignment. Skull base and vertebrae: No acute fracture. No primary bone lesion or focal pathologic process. Soft tissues and spinal canal: No prevertebral fluid or swelling. No visible canal hematoma. Postoperative right neck. Disc levels:  Generalized degenerative endplate and facet spurring. Upper chest: No acute finding IMPRESSION: 1. Bilateral nasal bone fracture with mild depression. 2. No evidence of  intracranial injury or cervical spine fracture. 3. Known right scalp cancer which involves the parietal bone. Electronically Signed   By: Tiburcio Pea M.D.   On: 10/25/2022 13:01   DG Chest 2 View  Result Date: 10/25/2022 CLINICAL DATA:  Fall. EXAM: CHEST - 2 VIEW COMPARISON:  Chest x-ray dated October 10, 2022. FINDINGS: Stable cardiomediastinal silhouette with normal heart size status post CABG. Normal pulmonary vascularity. Chronically coarsened interstitial markings are unchanged. No focal consolidation, pleural effusion, or pneumothorax. No acute osseous abnormality. IMPRESSION: No active cardiopulmonary disease. Electronically Signed   By: Obie Dredge M.D.   On: 10/25/2022 12:57   DG Pelvis 1-2 Views  Result Date:  10/25/2022 CLINICAL DATA:  Fall. EXAM: PELVIS - 1-2 VIEW COMPARISON:  None Available. FINDINGS: There is no evidence of pelvic fracture or diastasis. No pelvic bone lesions are seen. IMPRESSION: Negative. Electronically Signed   By: Lupita Raider M.D.   On: 10/25/2022 12:56    Procedures Procedures    Medications Ordered in ED Medications  lidocaine-EPINEPHrine (XYLOCAINE W/EPI) 2 %-1:200000 (PF) injection 10 mL (has no administration in time range)  pentafluoroprop-tetrafluoroeth (GEBAUERS) aerosol (has no administration in time range)  silver nitrate applicators applicator 1 Application (has no administration in time range)  cephALEXin (KEFLEX) capsule 500 mg (has no administration in time range)  Tdap (BOOSTRIX) injection 0.5 mL (0.5 mLs Intramuscular Given 10/25/22 1156)  lidocaine-EPINEPHrine-tetracaine (LET) topical gel (3 mLs Topical Given 10/25/22 1316)    ED Course/ Medical Decision Making/ A&P                             Medical Decision Making Amount and/or Complexity of Data Reviewed Radiology: ordered.  Risk Prescription drug management.   This patient presents to the ED for concern of fall, this involves an extensive number of treatment options, and is a complaint that carries with it a high risk of complications and morbidity.  The differential diagnosis includes fx, ich, internal injury   Co morbidities that complicate the patient evaluation  hld, cva, kidney stones, cad, and skin cancer   Additional history obtained:  Additional history obtained from epic chart review External records from outside source obtained and reviewed including friend   Imaging Studies ordered:  I ordered imaging studies including ct head/face, c-spine  I independently visualized and interpreted imaging which showed  CT head/c-spine/face:  Bilateral nasal bone fracture with mild depression.  2. No evidence of intracranial injury or cervical spine fracture.  3. Known right  scalp cancer which involves the parietal bone.  CXR:  nothing acute Pelvis: neg  I agree with the radiologist interpretation   Cardiac Monitoring:  The patient was maintained on a cardiac monitor.  I personally viewed and interpreted the cardiac monitored which showed an underlying rhythm of: nsr   Medicines ordered and prescription drug management:  I ordered medication including let, boostrix  for wounds  Reevaluation of the patient after these medicines showed that the patient improved I have reviewed the patients home medicines and have made adjustments as needed   Test Considered:  ct   Critical Interventions:  ct   Problem List / ED Course:  Nasal fx:  as he has a lac on top of the fx, I will start him on keflex.  He needs to f/u with ENT. Facial lacs:  lacs repaired by PA Roemhildt. Fall:  pt is able to ambulate without any issues.  He is  stable for d/c.  Return if worse.  F/u with pcp/ENT.   Reevaluation:  After the interventions noted above, I reevaluated the patient and found that they have :improved   Social Determinants of Health:  Lives at home   Dispostion:  After consideration of the diagnostic results and the patients response to treatment, I feel that the patent would benefit from discharge with outpatient f/u.          Final Clinical Impression(s) / ED Diagnoses Final diagnoses:  Fall, initial encounter  Open fracture of nasal bone, initial encounter  Laceration of forehead, initial encounter    Rx / DC Orders ED Discharge Orders          Ordered    cephALEXin (KEFLEX) 500 MG capsule  3 times daily        10/25/22 1355              Jacalyn Lefevre, MD 10/25/22 1428

## 2022-10-29 ENCOUNTER — Encounter (HOSPITAL_COMMUNITY): Payer: Self-pay | Admitting: Physical Therapy

## 2022-10-29 ENCOUNTER — Ambulatory Visit (HOSPITAL_COMMUNITY): Payer: Medicare Other | Attending: Internal Medicine | Admitting: Physical Therapy

## 2022-10-29 DIAGNOSIS — R2689 Other abnormalities of gait and mobility: Secondary | ICD-10-CM | POA: Insufficient documentation

## 2022-10-29 DIAGNOSIS — R29898 Other symptoms and signs involving the musculoskeletal system: Secondary | ICD-10-CM | POA: Insufficient documentation

## 2022-10-29 NOTE — Therapy (Signed)
[facility] Outpatient Rehabilitation at [facility] [address] [place], [place], [address] Phone: [phone]   Fax:  [phone]  Patient Details  Name: Adam [name] MRN: [id number] Date of Birth: 05-25-1941 Referring Provider:  Benita Stabile, MD  Encounter Date: 10/29/2022  Patient referred to physical therapy for gait and balance deficits and related falls. Also, hopes for dressing changes for healing wound related to skin cancer removal in December of 2023 that his family has been changing daily since procedure. Examined wound that was dressed well and no need for debridement or other skilled care required at this time. Educated patient on dressing changes not being considered skilled care for physical therapy and that he should talk with PCP about possible referral for home health wound care to assist with dressing management. Patient does not wish to pursue physical therapy referral for gait and balance deficit despite recent falls as he states he is active and follows his own walking/ exercise program. Patient educated on returning to physical therapy if needed.   10:09 AM, 10/29/22 Wyman Songster PT, DPT Physical Therapist at [facility]    [facility] Outpatient Rehabilitation at [facility] [address] [place], [place], [address] Phone: [phone]   Fax:  [phone]

## 2022-11-02 ENCOUNTER — Ambulatory Visit (HOSPITAL_COMMUNITY): Payer: Medicare Other | Admitting: Physical Therapy

## 2022-11-04 ENCOUNTER — Ambulatory Visit (HOSPITAL_COMMUNITY): Payer: Medicare Other | Admitting: Physical Therapy

## 2022-11-05 NOTE — Progress Notes (Signed)
    Cardiology Office Note  Date: 11/06/2022   ID: Adam [name], DOB 12/05/1940, MRN [id number]  History of Present Illness: Adam [name] is a 82 y.o. male last seen in March 2023 by Ms. Strader PA-C, I reviewed the note.  He is here today with family member for a follow-up visit.  He does not report any angina, no sense of palpitations, no progressive shortness of breath with typical activities.  He does notice orthostatic lightheadedness, particularly if he has been seated for a while and then stands up quickly.  He has had a few episodes where he has actually blacked out.  He thought that it may have been due to Plavix and held the medication.  Otherwise, he is not on any antihypertensives and runs a low normal blood pressure at baseline as reflected in today's measurement.  I reviewed his lab work, LDL is well-controlled at 42 on Lipitor which he is tolerating without obvious side effects.  He continues to follow with Dr. Margo Aye for primary care.  Physical Exam: VS:  BP (!) 102/54   Pulse 68   Ht 5\' 9"  (1.753 m)   Wt 135 lb 9.6 oz (61.5 kg)   SpO2 98%   BMI 20.02 kg/m , BMI Body mass index is 20.02 kg/m.  Wt Readings from Last 3 Encounters:  11/06/22 135 lb 9.6 oz (61.5 kg)  10/25/22 135 lb (61.2 kg)  10/10/22 135 lb 9.6 oz (61.5 kg)    General: Patient appears comfortable at rest. HEENT: Conjunctiva and lids normal. Neck: Supple, no elevated JVP. Lungs: Clear to auscultation, nonlabored breathing at rest. Cardiac: Regular rate and rhythm, no S3, no systolic murmur. Extremities: No pitting edema.  ECG:  An ECG dated 10/10/2022 was personally reviewed today and demonstrated:  Sinus rhythm with right bundle branch block and nonspecific ST-T changes.  Labwork: 10/10/2022: BUN 31; Creatinine, Ser 0.92; Hemoglobin 12.4; Platelets 208; Potassium 3.7; Sodium 135  April 2024: TSH 1.9, hemoglobin 12.3, platelets 202, BUN 23, creatinine 0.9, potassium 5, AST 22, ALT 18, cholesterol  106, triglycerides 48, HDL 52, LDL 42  Other Studies Reviewed Today:  No interval cardiac testing for review today.  Assessment and Plan:  1.  Multivessel CAD status post CABG in November 2022 with LIMA to LAD, SVG to PDA, and SVG to RI 1/RI 2/OM 1.  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  Echocardiogram from November 2022 revealed LVEF 60 to 65% without regional wall motion abnormalities and no pericardial effusion.  He reports no angina or palpitations, was in sinus rhythm by ECG in March.  Continue aspirin, stop Plavix at this point.  Continue Lipitor.  2.  Orthostatic dizziness with low normal blood pressure at baseline.  Already taken off Lopressor.  Plan to initiate midodrine 2.5 mg twice daily for now.  3.  Mixed hyperlipidemia, recent LDL 42.  He is doing well on Lipitor 80 mg daily without obvious side effects.  LFTs normal in April.  4.  Carotid artery disease status post right CEA in 2022.  Carotid Dopplers from November 2022 revealed 1 to 39% LICA stenosis, patent RICA.  Update carotid Dopplers for next visit in 6 months.  Disposition:  Follow up  6 months.  Signed, Jonelle Sidle, M.D., F.A.C.C. [facility] at [facility]

## 2022-11-06 ENCOUNTER — Ambulatory Visit: Payer: Medicare Other | Attending: Cardiology | Admitting: Cardiology

## 2022-11-06 ENCOUNTER — Encounter: Payer: Self-pay | Admitting: Cardiology

## 2022-11-06 VITALS — BP 102/54 | HR 68 | Ht 69.0 in | Wt 135.6 lb

## 2022-11-06 DIAGNOSIS — I25119 Atherosclerotic heart disease of native coronary artery with unspecified angina pectoris: Secondary | ICD-10-CM

## 2022-11-06 DIAGNOSIS — I951 Orthostatic hypotension: Secondary | ICD-10-CM | POA: Diagnosis not present

## 2022-11-06 DIAGNOSIS — E782 Mixed hyperlipidemia: Secondary | ICD-10-CM | POA: Diagnosis not present

## 2022-11-06 DIAGNOSIS — I6523 Occlusion and stenosis of bilateral carotid arteries: Secondary | ICD-10-CM

## 2022-11-06 DIAGNOSIS — I779 Disorder of arteries and arterioles, unspecified: Secondary | ICD-10-CM

## 2022-11-06 MED ORDER — MIDODRINE HCL 2.5 MG PO TABS: 2.5000 mg | ORAL_TABLET | Freq: Two times a day (BID) | ORAL | 2 refills | Status: DC

## 2022-11-06 NOTE — Patient Instructions (Signed)
Medication Instructions:   STOP Plavix   START Midodrine 2.5 mg Twice a day  Labwork: None today  Testing/Procedures: Your physician has requested that you have a carotid duplex in 6 months. This test is an ultrasound of the carotid arteries in your neck. It looks at blood flow through these arteries that supply the brain with blood. Allow one hour for this exam. There are no restrictions or special instructions.   Follow-Up: 6 months  Any Other Special Instructions Will Be Listed Below (If Applicable).  If you need a refill on your cardiac medications before your next appointment, please call your pharmacy.

## 2022-11-10 ENCOUNTER — Ambulatory Visit (HOSPITAL_COMMUNITY): Payer: Medicare Other | Admitting: Physical Therapy

## 2022-11-12 ENCOUNTER — Ambulatory Visit (HOSPITAL_COMMUNITY): Payer: Medicare Other | Admitting: Physical Therapy

## 2022-11-16 ENCOUNTER — Ambulatory Visit (HOSPITAL_COMMUNITY): Payer: Medicare Other | Admitting: Physical Therapy

## 2022-11-18 ENCOUNTER — Ambulatory Visit (HOSPITAL_COMMUNITY): Payer: Medicare Other | Admitting: Physical Therapy

## 2022-11-23 ENCOUNTER — Ambulatory Visit (HOSPITAL_COMMUNITY): Payer: Medicare Other | Admitting: Physical Therapy

## 2022-11-25 ENCOUNTER — Ambulatory Visit (HOSPITAL_COMMUNITY): Payer: Medicare Other | Admitting: Physical Therapy

## 2023-03-08 ENCOUNTER — Other Ambulatory Visit (HOSPITAL_COMMUNITY): Payer: Self-pay | Admitting: Internal Medicine

## 2023-03-08 ENCOUNTER — Encounter: Payer: Self-pay | Admitting: Internal Medicine

## 2023-03-08 DIAGNOSIS — R0789 Other chest pain: Secondary | ICD-10-CM

## 2023-05-10 ENCOUNTER — Ambulatory Visit (HOSPITAL_COMMUNITY)
Admission: RE | Admit: 2023-05-10 | Discharge: 2023-05-10 | Disposition: A | Discharge status: Home / Self Care | Payer: Medicare Other | Source: Ambulatory Visit | Attending: Cardiology | Admitting: Cardiology

## 2023-05-10 DIAGNOSIS — I779 Disorder of arteries and arterioles, unspecified: Secondary | ICD-10-CM | POA: Diagnosis present

## 2023-11-10 ENCOUNTER — Encounter: Payer: Self-pay | Admitting: Internal Medicine

## 2023-12-18 ENCOUNTER — Other Ambulatory Visit: Payer: Self-pay | Admitting: Cardiology

## 2024-01-19 ENCOUNTER — Ambulatory Visit: Attending: Cardiology | Admitting: Cardiology

## 2024-01-19 ENCOUNTER — Encounter: Payer: Self-pay | Admitting: Cardiology

## 2024-01-19 VITALS — BP 112/60 | HR 60 | Ht 69.0 in | Wt 123.4 lb

## 2024-01-19 DIAGNOSIS — I951 Orthostatic hypotension: Secondary | ICD-10-CM | POA: Diagnosis not present

## 2024-01-19 DIAGNOSIS — I6523 Occlusion and stenosis of bilateral carotid arteries: Secondary | ICD-10-CM | POA: Diagnosis not present

## 2024-01-19 DIAGNOSIS — E782 Mixed hyperlipidemia: Secondary | ICD-10-CM | POA: Diagnosis not present

## 2024-01-19 DIAGNOSIS — I25119 Atherosclerotic heart disease of native coronary artery with unspecified angina pectoris: Secondary | ICD-10-CM

## 2024-01-19 NOTE — Patient Instructions (Signed)
 Medication Instructions:  Your physician recommends that you continue on your current medications as directed. Please refer to the Current Medication list given to you today.   Labwork: None today  Testing/Procedures: None today  Follow-Up: 1 year  Any Other Special Instructions Will Be Listed Below (If Applicable).  If you need a refill on your cardiac medications before your next appointment, please call your pharmacy.

## 2024-01-19 NOTE — Progress Notes (Signed)
    Cardiology Office Note  Date: 01/19/2024   ID: TIMITHY ARONS, DOB 1941/03/21, MRN [id number]  History of Present Illness: Adam [name] is an 83 y.o. male last seen in April 2024.  He is here today with family member for a follow-up visit.  He does not report any exertional chest pain or progressive dyspnea with typical activities.  No palpitations or syncope.  Does have some mild orthostatic dizziness, reportedly better with midodrine .  We went over his medications.  He reports compliance with therapy.  I did review his interval lab work obtained by Dr. Shona.  LDL 56 in April.  LFTs also normal.  I reviewed his ECG today which shows normal sinus rhythm with right bundle branch block.  Physical Exam: VS:  BP 112/60 (BP Location: Left Arm, Patient Position: Sitting, Cuff Size: Normal)   Pulse 60   Ht 5' 9 (1.753 m)   Wt 123 lb 6.4 oz (56 kg)   SpO2 98%   BMI 18.22 kg/m , BMI Body mass index is 18.22 kg/m.  Wt Readings from Last 3 Encounters:  01/19/24 123 lb 6.4 oz (56 kg)  11/06/22 135 lb 9.6 oz (61.5 kg)  10/25/22 135 lb (61.2 kg)    General: Patient appears comfortable at rest. HEENT: Conjunctiva and lids normal. Neck: Supple, no elevated JVP or carotid bruits. Lungs: Clear to auscultation, nonlabored breathing at rest. Cardiac: Regular rate and rhythm, no S3, 1/6 systolic murmur, no pericardial rub. Extremities: No pitting edema.  ECG:  An ECG dated 10/10/2022 was personally reviewed today and demonstrated:  Sinus rhythm with right bundle branch block and nonspecific ST-T changes.  Labwork:  April 2025: Hemoglobin A1c 6.8%, cholesterol 118, triglycerides 66, HDL 48, LDL 56, BUN 25, creatinine 0.87, potassium 6.0, AST 30, ALT 15, hemoglobin 13.7, platelets 207, TSH 2.73  Other Studies Reviewed Today:  No interval cardiac testing for review today.  Assessment and Plan:  1.  Multivessel CAD status post CABG in November 2022 with LIMA to LAD, SVG to PDA, and SVG to  RI 1/RI 2/OM 1.  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  Echocardiogram from November 2022 revealed LVEF 60 to 65% without regional wall motion abnormalities and no pericardial effusion.  He is symptomatically stable on medical therapy, ECG reviewed.  Continue aspirin  81 mg daily and Lipitor  80 mg daily.   2.  Orthostatic dizziness with low normal blood pressure at baseline.  Symptomatically stable at this point on midodrine  2.5 mg twice daily.  There is very room for up titration if necessary.  Also reasonable to liberalize salt in his diet.   3.  Mixed hyperlipidemia.  Continue Lipitor  80 mg daily.  LDL 56 in April.   4.  Carotid artery disease status post right CEA in 2022.  Carotid Dopplers from October 2024 revealed minor ICA atherosclerosis.  He remains on aspirin  and statin.  Disposition:  Follow up 1 year.  Signed, Jayson JUDITHANN Sierras, M.D., F.A.C.C. [facility]

## 2024-03-08 ENCOUNTER — Encounter: Payer: Self-pay | Admitting: Internal Medicine

## 2024-07-04 ENCOUNTER — Encounter: Payer: Self-pay | Admitting: Internal Medicine
